# Patient Record
Sex: Female | Born: 1995 | Race: Black or African American | Hispanic: No | Marital: Single | Smoking: Former smoker
Health system: Southern US, Community
[De-identification: ages and names within clinical notes are randomized; demographics above are authoritative.]

## PROBLEM LIST (undated history)

## (undated) ENCOUNTER — Inpatient Hospital Stay: Payer: Self-pay

## (undated) ENCOUNTER — Ambulatory Visit: Admission: EM | Payer: Medicaid Other | Source: Home / Self Care

## (undated) DIAGNOSIS — E079 Disorder of thyroid, unspecified: Secondary | ICD-10-CM

## (undated) DIAGNOSIS — T7840XA Allergy, unspecified, initial encounter: Secondary | ICD-10-CM

## (undated) DIAGNOSIS — O1410 Severe pre-eclampsia, unspecified trimester: Secondary | ICD-10-CM

## (undated) DIAGNOSIS — G43909 Migraine, unspecified, not intractable, without status migrainosus: Secondary | ICD-10-CM

## (undated) DIAGNOSIS — R011 Cardiac murmur, unspecified: Secondary | ICD-10-CM

## (undated) DIAGNOSIS — F419 Anxiety disorder, unspecified: Secondary | ICD-10-CM

## (undated) DIAGNOSIS — N289 Disorder of kidney and ureter, unspecified: Secondary | ICD-10-CM

## (undated) DIAGNOSIS — O09893 Supervision of other high risk pregnancies, third trimester: Secondary | ICD-10-CM

## (undated) DIAGNOSIS — F1910 Other psychoactive substance abuse, uncomplicated: Secondary | ICD-10-CM

## (undated) HISTORY — DX: Cardiac murmur, unspecified: R01.1

## (undated) HISTORY — DX: Other psychoactive substance abuse, uncomplicated: F19.10

## (undated) HISTORY — DX: Disorder of thyroid, unspecified: E07.9

## (undated) HISTORY — PX: THYROID SURGERY: SHX805

## (undated) HISTORY — DX: Anxiety disorder, unspecified: F41.9

## (undated) HISTORY — DX: Allergy, unspecified, initial encounter: T78.40XA

---
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
## 2014-01-21 ENCOUNTER — Observation Stay: Payer: Self-pay

## 2014-01-27 ENCOUNTER — Inpatient Hospital Stay: Payer: Self-pay

## 2014-01-27 LAB — BASIC METABOLIC PANEL
ANION GAP: 6 — AB (ref 7–16)
BUN: 10 mg/dL (ref 9–21)
CALCIUM: 8.4 mg/dL — AB (ref 9.0–10.7)
Chloride: 110 mmol/L — ABNORMAL HIGH (ref 97–107)
Co2: 23 mmol/L (ref 16–25)
Creatinine: 1.49 mg/dL — ABNORMAL HIGH (ref 0.60–1.30)
EGFR (African American): 59 — ABNORMAL LOW
GFR CALC NON AF AMER: 51 — AB
GLUCOSE: 80 mg/dL (ref 65–99)
OSMOLALITY: 276 (ref 275–301)
Potassium: 4 mmol/L (ref 3.3–4.7)
Sodium: 139 mmol/L (ref 132–141)

## 2014-01-27 LAB — PIH PROFILE
ANION GAP: 9 (ref 7–16)
BUN: 10 mg/dL (ref 9–21)
CHLORIDE: 105 mmol/L (ref 97–107)
Calcium, Total: 8.8 mg/dL — ABNORMAL LOW (ref 9.0–10.7)
Co2: 24 mmol/L (ref 16–25)
Creatinine: 1.5 mg/dL — ABNORMAL HIGH (ref 0.60–1.30)
EGFR (Non-African Amer.): 50 — ABNORMAL LOW
GFR CALC AF AMER: 58 — AB
GLUCOSE: 83 mg/dL (ref 65–99)
HCT: 37.8 % (ref 35.0–47.0)
HGB: 12 g/dL (ref 12.0–16.0)
MCH: 28 pg (ref 26.0–34.0)
MCHC: 31.8 g/dL — AB (ref 32.0–36.0)
MCV: 88 fL (ref 80–100)
Osmolality: 274 (ref 275–301)
POTASSIUM: 3.9 mmol/L (ref 3.3–4.7)
Platelet: 160 10*3/uL (ref 150–440)
RBC: 4.29 10*6/uL (ref 3.80–5.20)
RDW: 13 % (ref 11.5–14.5)
SGOT(AST): 19 U/L (ref 0–26)
Sodium: 138 mmol/L (ref 132–141)
URIC ACID: 5.3 mg/dL (ref 3.0–5.8)
WBC: 13 10*3/uL — AB (ref 3.6–11.0)

## 2014-01-27 LAB — URIC ACID: Uric Acid: 5.2 mg/dL (ref 3.0–5.8)

## 2014-01-27 LAB — WBC: WBC: 12.6 10*3/uL — ABNORMAL HIGH (ref 3.6–11.0)

## 2014-01-27 LAB — RBC: RBC: 4.33 10*6/uL (ref 3.80–5.20)

## 2014-01-27 LAB — GC/CHLAMYDIA PROBE AMP

## 2014-01-27 LAB — PROTEIN, URINE, RANDOM: Protein, Random Urine: 42 mg/dL — ABNORMAL HIGH (ref 0–12)

## 2014-01-27 LAB — CREATININE, URINE, RANDOM: Creatinine, Urine Random: 40.1 mg/dL (ref 30.0–125.0)

## 2014-01-27 LAB — SGOT (AST)(ARMC): SGOT(AST): 20 U/L (ref 0–26)

## 2014-01-27 LAB — HEMATOCRIT: HCT: 37.6 % (ref 35.0–47.0)

## 2014-01-27 LAB — PLATELET COUNT: Platelet: 143 10*3/uL — ABNORMAL LOW (ref 150–440)

## 2014-01-27 LAB — HEMOGLOBIN: HGB: 12.4 g/dL (ref 12.0–16.0)

## 2014-01-28 DIAGNOSIS — O09293 Supervision of pregnancy with other poor reproductive or obstetric history, third trimester: Secondary | ICD-10-CM

## 2014-01-28 LAB — PIH PROFILE
ANION GAP: 11 (ref 7–16)
BUN: 9 mg/dL (ref 9–21)
CO2: 22 mmol/L (ref 16–25)
Calcium, Total: 8 mg/dL — ABNORMAL LOW (ref 9.0–10.7)
Chloride: 98 mmol/L (ref 97–107)
Creatinine: 1.6 mg/dL — ABNORMAL HIGH (ref 0.60–1.30)
EGFR (African American): 54 — ABNORMAL LOW
GFR CALC NON AF AMER: 47 — AB
Glucose: 98 mg/dL (ref 65–99)
HCT: 39 % (ref 35.0–47.0)
HGB: 12.7 g/dL (ref 12.0–16.0)
MCH: 28.4 pg (ref 26.0–34.0)
MCHC: 32.6 g/dL (ref 32.0–36.0)
MCV: 87 fL (ref 80–100)
OSMOLALITY: 261 (ref 275–301)
Platelet: 173 10*3/uL (ref 150–440)
Potassium: 3.7 mmol/L (ref 3.3–4.7)
RBC: 4.47 10*6/uL (ref 3.80–5.20)
RDW: 13 % (ref 11.5–14.5)
SGOT(AST): 21 U/L (ref 0–26)
Sodium: 131 mmol/L — ABNORMAL LOW (ref 132–141)
URIC ACID: 5.9 mg/dL — AB (ref 3.0–5.8)
WBC: 16.5 10*3/uL — AB (ref 3.6–11.0)

## 2014-01-28 LAB — MAGNESIUM: MAGNESIUM: 10.9 mg/dL — AB

## 2014-01-29 LAB — CBC WITH DIFFERENTIAL/PLATELET
BASOS PCT: 0.3 %
Basophil #: 0 10*3/uL (ref 0.0–0.1)
Eosinophil #: 0 10*3/uL (ref 0.0–0.7)
Eosinophil %: 0.1 %
HCT: 36.8 % (ref 35.0–47.0)
HGB: 11.7 g/dL — AB (ref 12.0–16.0)
Lymphocyte #: 1.6 10*3/uL (ref 1.0–3.6)
Lymphocyte %: 13 %
MCH: 27.9 pg (ref 26.0–34.0)
MCHC: 31.7 g/dL — AB (ref 32.0–36.0)
MCV: 88 fL (ref 80–100)
Monocyte #: 0.8 x10 3/mm (ref 0.2–0.9)
Monocyte %: 6.8 %
Neutrophil #: 9.7 10*3/uL — ABNORMAL HIGH (ref 1.4–6.5)
Neutrophil %: 79.8 %
Platelet: 165 10*3/uL (ref 150–440)
RBC: 4.18 10*6/uL (ref 3.80–5.20)
RDW: 13.1 % (ref 11.5–14.5)
WBC: 12.1 10*3/uL — AB (ref 3.6–11.0)

## 2014-01-29 LAB — BASIC METABOLIC PANEL
ANION GAP: 6 — AB (ref 7–16)
BUN: 15 mg/dL (ref 9–21)
CALCIUM: 8.2 mg/dL — AB (ref 9.0–10.7)
CREATININE: 1.98 mg/dL — AB (ref 0.60–1.30)
Chloride: 103 mmol/L (ref 97–107)
Co2: 28 mmol/L — ABNORMAL HIGH (ref 16–25)
EGFR (African American): 42 — ABNORMAL LOW
GFR CALC NON AF AMER: 36 — AB
Glucose: 82 mg/dL (ref 65–99)
OSMOLALITY: 274 (ref 275–301)
Potassium: 4.3 mmol/L (ref 3.3–4.7)
SODIUM: 137 mmol/L (ref 132–141)

## 2014-01-29 LAB — COMPREHENSIVE METABOLIC PANEL
ALBUMIN: 2.4 g/dL — AB (ref 3.8–5.6)
AST: 26 U/L (ref 0–26)
Alkaline Phosphatase: 195 U/L — ABNORMAL HIGH
Anion Gap: 9 (ref 7–16)
BUN: 11 mg/dL (ref 9–21)
Bilirubin,Total: 0.3 mg/dL (ref 0.2–1.0)
CHLORIDE: 100 mmol/L (ref 97–107)
Calcium, Total: 7.8 mg/dL — ABNORMAL LOW (ref 9.0–10.7)
Co2: 26 mmol/L — ABNORMAL HIGH (ref 16–25)
Creatinine: 2.06 mg/dL — ABNORMAL HIGH (ref 0.60–1.30)
EGFR (African American): 40 — ABNORMAL LOW
EGFR (Non-African Amer.): 34 — ABNORMAL LOW
Glucose: 101 mg/dL — ABNORMAL HIGH (ref 65–99)
Osmolality: 270 (ref 275–301)
POTASSIUM: 4.3 mmol/L (ref 3.3–4.7)
SGPT (ALT): 12 U/L — ABNORMAL LOW
Sodium: 135 mmol/L (ref 132–141)
Total Protein: 5.4 g/dL — ABNORMAL LOW (ref 6.4–8.6)

## 2014-01-30 LAB — BASIC METABOLIC PANEL
ANION GAP: 2 — AB (ref 7–16)
BUN: 15 mg/dL (ref 9–21)
CALCIUM: 8.7 mg/dL — AB (ref 9.0–10.7)
CO2: 25 mmol/L (ref 16–25)
Chloride: 106 mmol/L (ref 97–107)
Creatinine: 1.73 mg/dL — ABNORMAL HIGH (ref 0.60–1.30)
EGFR (African American): 49 — ABNORMAL LOW
EGFR (Non-African Amer.): 42 — ABNORMAL LOW
GLUCOSE: 82 mg/dL (ref 65–99)
Osmolality: 266 (ref 275–301)
Potassium: 4.3 mmol/L (ref 3.3–4.7)
Sodium: 133 mmol/L (ref 132–141)

## 2014-01-31 LAB — BASIC METABOLIC PANEL
Anion Gap: 2 — ABNORMAL LOW (ref 7–16)
BUN: 17 mg/dL (ref 9–21)
CO2: 29 mmol/L — AB (ref 16–25)
CREATININE: 1.65 mg/dL — AB (ref 0.60–1.30)
Calcium, Total: 8.7 mg/dL — ABNORMAL LOW (ref 9.0–10.7)
Chloride: 102 mmol/L (ref 97–107)
GFR CALC AF AMER: 52 — AB
GFR CALC NON AF AMER: 45 — AB
GLUCOSE: 87 mg/dL (ref 65–99)
Osmolality: 267 (ref 275–301)
POTASSIUM: 4.3 mmol/L (ref 3.3–4.7)
Sodium: 133 mmol/L (ref 132–141)

## 2014-05-17 ENCOUNTER — Emergency Department: Payer: Self-pay | Admitting: Emergency Medicine

## 2014-05-17 LAB — URINALYSIS, COMPLETE
BACTERIA: NONE SEEN
BLOOD: NEGATIVE
Bilirubin,UR: NEGATIVE
Bilirubin,UR: NEGATIVE
Blood: NEGATIVE
Glucose,UR: NEGATIVE mg/dL (ref 0–75)
Glucose,UR: NEGATIVE mg/dL (ref 0–75)
KETONE: NEGATIVE
KETONE: NEGATIVE
Leukocyte Esterase: NEGATIVE
NITRITE: NEGATIVE
Nitrite: NEGATIVE
Ph: 7 (ref 4.5–8.0)
Ph: 7 (ref 4.5–8.0)
SPECIFIC GRAVITY: 1.018 (ref 1.003–1.030)
Specific Gravity: 1.018 (ref 1.003–1.030)
Squamous Epithelial: 28
WBC UR: 5 /HPF (ref 0–5)
WBC UR: 3 /HPF (ref 0–5)

## 2014-05-17 LAB — CBC WITH DIFFERENTIAL/PLATELET
BASOS ABS: 0 10*3/uL (ref 0.0–0.1)
Basophil %: 0.2 %
EOS ABS: 0.1 10*3/uL (ref 0.0–0.7)
Eosinophil %: 1.6 %
HCT: 39 % (ref 35.0–47.0)
HGB: 12.5 g/dL (ref 12.0–16.0)
Lymphocyte #: 1.9 10*3/uL (ref 1.0–3.6)
Lymphocyte %: 27.8 %
MCH: 27.4 pg (ref 26.0–34.0)
MCHC: 32 g/dL (ref 32.0–36.0)
MCV: 86 fL (ref 80–100)
Monocyte #: 0.4 x10 3/mm (ref 0.2–0.9)
Monocyte %: 5.5 %
NEUTROS ABS: 4.4 10*3/uL (ref 1.4–6.5)
Neutrophil %: 64.9 %
PLATELETS: 155 10*3/uL (ref 150–440)
RBC: 4.55 10*6/uL (ref 3.80–5.20)
RDW: 12.9 % (ref 11.5–14.5)
WBC: 6.8 10*3/uL (ref 3.6–11.0)

## 2014-05-17 LAB — COMPREHENSIVE METABOLIC PANEL
Albumin: 3.5 g/dL — ABNORMAL LOW (ref 3.8–5.6)
Alkaline Phosphatase: 108 U/L
Anion Gap: 5 — ABNORMAL LOW (ref 7–16)
BILIRUBIN TOTAL: 0.2 mg/dL (ref 0.2–1.0)
BUN: 18 mg/dL (ref 7–18)
CALCIUM: 8.7 mg/dL — AB (ref 9.0–10.7)
CO2: 28 mmol/L (ref 21–32)
CREATININE: 1.42 mg/dL — AB (ref 0.60–1.30)
Chloride: 107 mmol/L (ref 98–107)
GFR CALC NON AF AMER: 51 — AB
Glucose: 104 mg/dL — ABNORMAL HIGH (ref 65–99)
Osmolality: 282 (ref 275–301)
Potassium: 3.9 mmol/L (ref 3.5–5.1)
SGOT(AST): 22 U/L (ref 0–26)
SGPT (ALT): 16 U/L
SODIUM: 140 mmol/L (ref 136–145)
Total Protein: 6.4 g/dL (ref 6.4–8.6)

## 2014-05-17 LAB — PREGNANCY, URINE: PREGNANCY TEST, URINE: NEGATIVE m[IU]/mL

## 2014-06-30 NOTE — L&D Delivery Note (Addendum)
Delivery Note At 5:45 PM a viable  female was delivered via Vaginal, Spontaneous Delivery (Presentation: Left Occiput ).  APGAR: 9,9 ; weight 6#13oz .   Placenta status: , Spontaneous.  Cord: 3 vessels with the following complications: none.  Cord pH: N/A  Anesthesia: Epidural  Episiotomy: None Lacerations: Labial Suture Repair: 3.0 vicryl Est. Blood Loss (mL): 350  Mom to postpartum.  Baby to Couplet care / Skin to Skin.  15 minute second stage. Infant to maternal abdomen for delayed cord clamping, cut by FOB after pulsation stopped. Infant to skin-to-skin with mother.   Baby's Name: Joyce [name] 02/12/2015, 6:10 PM

## 2014-07-30 ENCOUNTER — Emergency Department: Payer: Self-pay | Admitting: Emergency Medicine

## 2014-07-30 LAB — CBC
HCT: 39.6 % (ref 35.0–47.0)
HGB: 12.6 g/dL (ref 12.0–16.0)
MCH: 26.5 pg (ref 26.0–34.0)
MCHC: 31.8 g/dL — AB (ref 32.0–36.0)
MCV: 83 fL (ref 80–100)
Platelet: 158 10*3/uL (ref 150–440)
RBC: 4.76 10*6/uL (ref 3.80–5.20)
RDW: 13.6 % (ref 11.5–14.5)
WBC: 6.4 10*3/uL (ref 3.6–11.0)

## 2014-07-30 LAB — BASIC METABOLIC PANEL
ANION GAP: 9 (ref 7–16)
BUN: 11 mg/dL (ref 7–18)
CALCIUM: 8.9 mg/dL — AB (ref 9.0–10.7)
CHLORIDE: 107 mmol/L (ref 98–107)
Co2: 21 mmol/L (ref 21–32)
Creatinine: 1.18 mg/dL (ref 0.60–1.30)
EGFR (African American): >60
EGFR (Non-African Amer.): >60
Glucose: 86 mg/dL (ref 65–99)
Osmolality: 273 (ref 275–301)
POTASSIUM: 4.2 mmol/L (ref 3.5–5.1)
Sodium: 137 mmol/L (ref 136–145)

## 2014-07-30 LAB — GC/CHLAMYDIA PROBE AMP

## 2014-07-30 LAB — HCG, QUANTITATIVE, PREGNANCY: Beta Hcg, Quant.: 128600 m[IU]/mL — ABNORMAL HIGH

## 2014-07-30 LAB — WET PREP, GENITAL

## 2014-08-28 LAB — OB RESULTS CONSOLE GC/CHLAMYDIA
Chlamydia: NEGATIVE
Gonorrhea: NEGATIVE

## 2014-08-28 LAB — OB RESULTS CONSOLE RPR: RPR: NONREACTIVE

## 2014-08-28 LAB — OB RESULTS CONSOLE RUBELLA ANTIBODY, IGM: RUBELLA: IMMUNE

## 2014-08-28 LAB — OB RESULTS CONSOLE ABO/RH: RH TYPE: NEGATIVE

## 2014-08-28 LAB — OB RESULTS CONSOLE HGB/HCT, BLOOD
HEMATOCRIT: 37 %
Hemoglobin: 11.9 g/dL

## 2014-08-28 LAB — OB RESULTS CONSOLE ANTIBODY SCREEN: Antibody Screen: POSITIVE

## 2014-08-28 LAB — OB RESULTS CONSOLE HIV ANTIBODY (ROUTINE TESTING): HIV: NONREACTIVE

## 2014-08-28 LAB — OB RESULTS CONSOLE HEPATITIS B SURFACE ANTIGEN: Hepatitis B Surface Ag: NEGATIVE

## 2014-08-28 LAB — OB RESULTS CONSOLE VARICELLA ZOSTER ANTIBODY, IGG: VARICELLA IGG: IMMUNE

## 2014-08-28 LAB — OB RESULTS CONSOLE TSH: TSH: 2.03

## 2014-09-20 ENCOUNTER — Ambulatory Visit
Admit: 2014-09-20 | Disposition: A | Discharge status: Home / Self Care | Payer: Self-pay | Attending: Obstetrics and Gynecology | Admitting: Obstetrics and Gynecology

## 2014-09-29 ENCOUNTER — Ambulatory Visit
Admit: 2014-09-29 | Disposition: A | Discharge status: Home / Self Care | Payer: Self-pay | Attending: Obstetrics and Gynecology | Admitting: Obstetrics and Gynecology

## 2014-11-07 NOTE — H&P (Signed)
L&D Evaluation:  History Expanded:  HPI 19 year old g1 P0with EDC=02/27/2014 by [id number] ultrasound presented to L&D at 7419w5d with c/o cramping and contractions.  This has been ongoing for several days.  She was seen a few weeks ago on L&D by Sonda Primesoleen Gutierrez and discharged with reassurance and a diagnosis of a yeast infection. She has had no leakage nor vaginal bleeding.  She ahs a hx of OCD and PTSD, [facility] first [facility] and remarkable for a couple of prior episodes of bleeding. Is O neg blood type and received Rhogam 1/8 for bleeding. PNC also remarkable for GBS UTI. She also has another yeast infection. SHe presents with cramoing and leftr work to get here and came in by EMS due to the pain.   Gravida 1   Term 0   PreTerm 0   Abortion 0   Living 0   Blood Type (Maternal) O negative   Group B Strep Results Maternal (Result >5wks must be treated as unknown) positive   Maternal HIV Negative   Maternal Syphilis Ab Nonreactive   Maternal Varicella Immune   Rubella Results (Maternal) immune   Maternal T-Dap Unknown   [facility]   Presents with abdominal pain, contractions   Patient's Medical History Depression, OCD, PTSD   Medications Pre Natal Vitamins   Allergies NKDA   Social History none   Family History Non-Contributory   ROS:  ROS see HPI   Exam:  Vital Signs stable  afebrile, otherwise within normal limits   General no apparent distress   Mental Status clear   Chest moves air well   Heart normal sinus rhythm   Abdomen gravid, non-tender   Edema no edema   Pelvic no external lesions, deferred   Mebranes Intact   FHT normal rate with no decels, CAT 1   FHT Description 140/mod var/no accels/no decels   Ucx irritability   Skin no lesions   Lymph no lymphadenopathy   Impression:  Impression UTI, preterm contractions   Plan:  Comments send UA and give diflucan for the yeast infection   Follow Up Appointment in 1  week   Electronic Signatures: Adria DevonKlett, Carrie (MD)  (Signed 19-Jun-15 22:46)  Authored: L&D Evaluation   Last Updated: 19-Jun-15 22:46 by Adria DevonKlett, Carrie (MD)

## 2014-11-07 NOTE — H&P (Signed)
L&D Evaluation:  History:  HPI 19 year old G1 at 7455w4d by 8wk1d US derived EDC=02/27/2014 presenting with contractions starting this morning.  No LOF, no VB, +FM.  Mildly elevated diastolic blood pressures noted on presentation.  Reports headaches, some blurry vision in the past two days.  No increased edema, no RUQ or epigastric pain.  Reports seizure prior this pregnancy but reportedly noraml blood pressures at that time and normotensive throughout pregnancy based on prenatal records.  Denies nausea/emesis/loose stools.  No history of trauma or prior kidney problems to account for elevation in serum Cr.     Is O neg blood type and received Rhogam 1/8 for bleeding. PNC also remarkable for GBS UTI, and a yeast infection.   Presents with decreased fetal movement   Patient's Medical History Depression, OCD, PTSD   Patient's Surgical History none   Medications Pre Natal Vitamins   Allergies NKDA   Social History none   Family History Non-Contributory   ROS:  ROS see HPI   Exam:  Vital Signs stable  BP >140/90  119-167/70/96   Urine Protein P/C ratio pending   General no apparent distress   Mental Status clear   Chest clear   Heart normal sinus rhythm   Abdomen gravid, non-tender   Estimated Fetal Weight Small for gestational age   Fetal Position vtx   Edema no edema   Pelvic no external lesions, 1/70/-2   Mebranes Intact   FHT normal rate with no decels, CAT 1   FHT Description 135/mod var/+accels/no decels   Ucx irritability   Skin dry, no lesions   Lymph no lymphadenopathy   Impression:  Impression reactive NST, 19 yo G1 at 5355w4d presenting wtih contractions but diagnosed with severe preeclampsia   Plan:  Comments 1) Severe Preeclampsia - based on recent neuroligc symptoms and Cr>1.1 (1.49), mild thrombocytopenia with platelets of 143.  H&H 12.4 and 37.6 previously anemic at 28 weeks with H&H at that time 10.6 & 32.0     - severe preeclampsia start  magnesium sulfate for seizure ppx     - pitocin     - monitor urine output closely given elevated serum Cr  2) Fetus - category I tracing     - EFW of 2lbs 13oz c/w 46.5%ile at 705w0d on 12/12/2013  3) PNL O neg / positve anti-D (received rhogam in ER) / RI / VZI / HBsAg neg / RPR NR / HIV neg / 1-h 83 / GBS bacteruria     - ampicillin for GBS ppx  4) TDAP - received 12/21/13  5) Disposition - pending delivery   Follow Up Appointment already scheduled. 2 days   Electronic Signatures for Addendum Section:  Lorrene ReidStaebler, Monterrio Gerst M (MD) (Signed Addendum 31-Jul-15 12:17)  PC ratio not calculated by lab but calculation come to 1047mg /g creatnine further solidifying diagnosis   Electronic Signatures: Lorrene ReidStaebler, Shakeia Krus M (MD)  (Signed 31-Jul-15 12:05)  Authored: L&D Evaluation   Last Updated: 31-Jul-15 12:17 by Lorrene ReidStaebler, Tysheena Ginzburg M (MD)

## 2014-11-07 NOTE — H&P (Signed)
L&D Evaluation:  History:  HPI 19 year old g1 P0with EDC=02/27/2014 by [id number] ultrasound presented to L&D at 4169w5d with c/o decreased fetal movement. She reports that yesterday and today she has been busy and has not noticed her baby move as much. She has only had half of a 6 inch sub today to eat all day and some water. She also reports some BH ctx at times. She reports that she did FKC earlier today but was not able to verbalize the amount of kicks needed in the 2 hour time span.  This has been ongoing for several days.  She has had no leakage nor vaginal bleeding.  She has a hx of OCD and PTSD, [facility] and remarkable for a couple of prior episodes of bleeding. Is O neg blood type and received Rhogam 1/8 for bleeding. PNC also remarkable for GBS UTI, and a yeast infection.   Presents with decreased fetal movement   Patient's Medical History Depression, OCD, PTSD   Patient's Surgical History none   Medications Pre Natal Vitamins   Allergies NKDA   Social History none   Family History Non-Contributory   ROS:  ROS see HPI   Exam:  Vital Signs stable  BP >140/90   General no apparent distress   Mental Status clear   Chest moves air well   Heart normal sinus rhythm   Abdomen gravid, non-tender   Edema no edema   Pelvic no external lesions, deferred   Mebranes Intact   FHT normal rate with no decels, CAT 1   FHT Description 135/mod var/+accels/no decels   Ucx irritability   Skin dry, no lesions   Lymph no lymphadenopathy   Impression:  Impression UTI, reactive NST, IUP at 3969w5d, Cat 1 NST   Plan:  Plan discharge, FKC discussed, PTL precautions   Follow Up Appointment already scheduled. 2 days   Electronic Signatures: Jannet MantisSubudhi, Mikeyla Music (CNM)  (Signed 25-Jul-15 20:02)  Authored: L&D Evaluation   Last Updated: 25-Jul-15 20:02 by Jannet MantisSubudhi, Dianey Suchy (CNM)

## 2014-11-07 NOTE — H&P (Signed)
L&D Evaluation:  History:  HPI 19 year old g1 P0with EDC=02/27/2014 by [id number] ultrasound presented to L&D at 24  3/7 weeks with c/o cramping , decreased FM and bleeding. She was accompanied by the school nurse. C/O cramping for 2 days and woke up this AM with blood in her underwear (brown-pink in color). Has not eaten or drunk very much today sofar. Also c/o not feeling baby move x 3 days, but has felt the baby move today. No dysuria, vulvar irritation or unusual discharge.[facility] and remarkable for a couple of prior episodes of bleeding. Is O neg blood type and received Rhogam 1/8 for bleeding. PNC also remarkable for GBS UTI.   Presents with abdominal pain, decreased fetal movement, vaginal bleeding   Patient's Medical History Depression, OCD, PTSD    Medications Pre Natal Vitamins    Allergies NKDA   Family History Non-Contributory    ROS:  ROS see HPI   Exam:  Vital Signs stable    Urine Protein pending   General no apparent distress   Mental Status clear    Abdomen gravid, mild SP tenderness with palpation.   Fetal Position breech with posterior placenta   Pelvic cervix closed and thick, mild inflammation of introitus. wet prep positive for monilia. discharge is white-no bleeding seen   Mebranes Intact   FHT normal rate with no decels, age appropriate   Ucx irritability with occasional ctx   Skin dry   Impression:  Impression IUP at 24 .3 weeks. No active bleeding. Monila VV. R/O UTI   Plan:  Plan UA, monitor contractions and for cervical change, Diflucan 150mg m po given. PO fluids. FFN and Aptima obtained.   Electronic Signatures: Trinna BalloonGutierrez, Byrant Valent L (CNM)  (Signed 14-May-15 13:38)  Authored: L&D Evaluation   Last Updated: 14-May-15 13:38 by Trinna BalloonGutierrez, Gracia Saggese L (CNM)

## 2014-11-07 NOTE — H&P (Signed)
L&D Evaluation:  History Expanded:  HPI 19 year old g1 P0with EDC=02/27/2014 by [id number] ultrasound presented to L&D at 24  6/7 weeks with c/o cramping and contractions.  This has been ongoing for several days.  She was seen three days ago on L&D by Sonda Primesoleen Gutierrez and discharged with reassurance and a diagnosis of a yeast infection. She also had a gush of fluid this morning at 10.  This occured once and did not occur again and she has had no leakage nor vaginal bleeding.  [facility] and remarkable for a couple of prior episodes of bleeding. Is O neg blood type and received Rhogam 1/8 for bleeding. PNC also remarkable for GBS UTI.   Patient's Medical History Depression, OCD, PTSD   Medications Pre Natal Vitamins   Allergies NKDA   Family History Non-Contributory   ROS:  ROS see HPI   Exam:  Vital Signs afebrele, otherwise within normal limits   General no apparent distress   Mental Status clear   Chest moves air well   Abdomen gravid, non-tender   Edema no edema   Pelvic no external lesions, closed/length hard to determine, though appears long   Mebranes Intact   FHT normal rate with no decels   FHT Description 140/mod var/no accels/no decels   Ucx irritability   Skin no lesions   Impression:  Impression preterm contractions   Plan:  Comments negative fFN 3 days ago u/a three days ago wnl send gonorrhea/chlamydia cultures, wet prep negative obtain vaginal length ultrasound = patient declines.    Will have her follow up this coming week and, if symptoms persist, obtain a cervical length ultrasound.   [facility] schedule[facility]   Labs:  Lab Results:  Routine Micro:  17-May-15 16:13   Micro Text Report WET PREP   COMMENT                   MANY WHITE BLOOD CELLS SEEN   COMMENT                   NO CLUE CELLS SEEN   COMMENT                   NO TRICHOMONAS SEEN   COMMENT                    NO YEAST SEEN   COMMENT                   NO SPERMATOZOA SEEN   ANTIBIOTIC                       Comment 1. MANY WHITE BLOOD CELLS SEEN  Comment 2. NO CLUE CELLS SEEN  Comment 3. NO TRICHOMONAS SEEN  Comment 4. NO YEAST SEEN  Comment 5. NO SPERMATOZOA SEEN  Result(s) reported on 13 Nov 2013 at 04:35PM.   Electronic Signatures: Conard NovakJackson, Vetta Couzens D (MD)  (Signed 17-May-15 17:05)  Authored: L&D Evaluation, Labs   Last Updated: 17-May-15 17:05 by Conard NovakJackson, Ashely Joshua D (MD)

## 2014-12-26 ENCOUNTER — Observation Stay
Admission: RE | Admit: 2014-12-26 | Discharge: 2014-12-26 | Disposition: A | Discharge status: Home / Self Care | Payer: Medicaid Other | Attending: Obstetrics and Gynecology | Admitting: Obstetrics and Gynecology

## 2014-12-26 ENCOUNTER — Encounter: Payer: Self-pay | Admitting: Certified Nurse Midwife

## 2014-12-26 DIAGNOSIS — Z87448 Personal history of other diseases of urinary system: Secondary | ICD-10-CM

## 2014-12-26 DIAGNOSIS — R636 Underweight: Secondary | ICD-10-CM

## 2014-12-26 DIAGNOSIS — R7989 Other specified abnormal findings of blood chemistry: Secondary | ICD-10-CM | POA: Diagnosis present

## 2014-12-26 DIAGNOSIS — O26893 Other specified pregnancy related conditions, third trimester: Principal | ICD-10-CM | POA: Insufficient documentation

## 2014-12-26 DIAGNOSIS — Z9889 Other specified postprocedural states: Secondary | ICD-10-CM

## 2014-12-26 DIAGNOSIS — O09893 Supervision of other high risk pregnancies, third trimester: Secondary | ICD-10-CM

## 2014-12-26 DIAGNOSIS — R030 Elevated blood-pressure reading, without diagnosis of hypertension: Secondary | ICD-10-CM | POA: Diagnosis present

## 2014-12-26 DIAGNOSIS — O1410 Severe pre-eclampsia, unspecified trimester: Secondary | ICD-10-CM | POA: Diagnosis present

## 2014-12-26 DIAGNOSIS — Z3A30 30 weeks gestation of pregnancy: Secondary | ICD-10-CM | POA: Diagnosis not present

## 2014-12-26 DIAGNOSIS — G43909 Migraine, unspecified, not intractable, without status migrainosus: Secondary | ICD-10-CM | POA: Diagnosis not present

## 2014-12-26 DIAGNOSIS — N289 Disorder of kidney and ureter, unspecified: Secondary | ICD-10-CM

## 2014-12-26 HISTORY — DX: Other specified postprocedural states: Z98.890

## 2014-12-26 HISTORY — DX: Migraine, unspecified, not intractable, without status migrainosus: G43.909

## 2014-12-26 HISTORY — DX: Underweight: R63.6

## 2014-12-26 HISTORY — DX: Supervision of other high risk pregnancies, third trimester: O09.893

## 2014-12-26 HISTORY — DX: Severe pre-eclampsia, unspecified trimester: O14.10

## 2014-12-26 HISTORY — DX: Personal history of other diseases of urinary system: Z87.448

## 2014-12-26 HISTORY — DX: Other specified abnormal findings of blood chemistry: R79.89

## 2014-12-26 HISTORY — DX: Elevated blood-pressure reading, without diagnosis of hypertension: R03.0

## 2014-12-26 HISTORY — DX: Disorder of kidney and ureter, unspecified: N28.9

## 2014-12-26 LAB — CBC
HCT: 36.1 % (ref 35.0–47.0)
Hemoglobin: 11.6 g/dL — ABNORMAL LOW (ref 12.0–16.0)
MCH: 28.2 pg (ref 26.0–34.0)
MCHC: 32.1 g/dL (ref 32.0–36.0)
MCV: 87.9 fL (ref 80.0–100.0)
PLATELETS: 130 10*3/uL — AB (ref 150–440)
RBC: 4.11 MIL/uL (ref 3.80–5.20)
RDW: 13.7 % (ref 11.5–14.5)
WBC: 8 10*3/uL (ref 3.6–11.0)

## 2014-12-26 LAB — COMPREHENSIVE METABOLIC PANEL
ALBUMIN: 2.9 g/dL — AB (ref 3.5–5.0)
ALK PHOS: 72 U/L (ref 38–126)
ALT: 8 U/L — AB (ref 14–54)
AST: 20 U/L (ref 15–41)
Anion gap: 7 (ref 5–15)
BILIRUBIN TOTAL: 0.4 mg/dL (ref 0.3–1.2)
BUN: 13 mg/dL (ref 6–20)
CO2: 24 mmol/L (ref 22–32)
CREATININE: 1.11 mg/dL — AB (ref 0.44–1.00)
Calcium: 8.7 mg/dL — ABNORMAL LOW (ref 8.9–10.3)
Chloride: 107 mmol/L (ref 101–111)
GFR calc Af Amer: >60 mL/min (ref >60)
GFR calc non Af Amer: >60 mL/min (ref >60)
Glucose, Bld: 96 mg/dL (ref 65–99)
POTASSIUM: 3.7 mmol/L (ref 3.5–5.1)
SODIUM: 138 mmol/L (ref 135–145)
Total Protein: 5.7 g/dL — ABNORMAL LOW (ref 6.5–8.1)

## 2014-12-26 LAB — PROTEIN / CREATININE RATIO, URINE
Creatinine, Urine: 92 mg/dL
Protein Creatinine Ratio: 0.29 mg/mg{Cre} — ABNORMAL HIGH (ref 0.00–0.15)
Total Protein, Urine: 27 mg/dL

## 2014-12-26 LAB — TYPE AND SCREEN
ABO/RH(D): O NEG
ANTIBODY SCREEN: POSITIVE

## 2014-12-26 LAB — ABO/RH: ABO/RH(D): O NEG

## 2014-12-26 MED ORDER — ACETAMINOPHEN 325 MG PO TABS: 650.0000 mg | ORAL_TABLET | ORAL | Status: DC | PRN

## 2014-12-26 NOTE — Progress Notes (Signed)
Subjective:  Doing well  Objective:   Vitals: Blood pressure 137/83, pulse 76, temperature 98.6 F (37 C), temperature source Oral, resp. rate 16, height 5\' 9"  (1.753 m), weight 61.236 kg (135 lb), last menstrual period 05/29/2014.  Filed Vitals:   12/26/14 1149 12/26/14 1203 12/26/14 1219 12/26/14 1234  BP: 125/76 132/80 132/80 137/83  Pulse: 72 72 71 76  Temp:      TempSrc:      Resp:      Height:      Weight:        General:  NAD Abdomen: gravid soft, nontender    FHT: 125, moderate, positive accels, no decels Toco: none  Results for orders placed or performed during the hospital encounter of 12/26/14 (from the past 24 hour(s))  Protein / creatinine ratio, urine     Status: Abnormal   Collection Time: 12/26/14 11:48 AM  Result Value Ref Range   Creatinine, Urine 92 mg/dL   Total Protein, Urine 27 mg/dL   Protein Creatinine Ratio 0.29 (H) 0.00 - 0.15 mg/mg[Cre]  CBC     Status: Abnormal   Collection Time: 12/26/14 12:02 PM  Result Value Ref Range   WBC 8.0 3.6 - 11.0 K/uL   RBC 4.11 3.80 - 5.20 MIL/uL   Hemoglobin 11.6 (L) 12.0 - 16.0 g/dL   HCT 91.436.1 78.235.0 - 95.647.0 %   MCV 87.9 80.0 - 100.0 fL   MCH 28.2 26.0 - 34.0 pg   MCHC 32.1 32.0 - 36.0 g/dL   RDW 21.313.7 08.611.5 - 57.814.5 %   Platelets 130 (L) 150 - 440 K/uL    Assessment:   19 y.o. G2P0101 2653w1d sent for evaluation of preeclampsia  Plan:   1) Normotensive, normal labs P/C ratio is 290, Cr is 1.1 stable from patient baseline  2) Fetus - category I tracing, reactive by <32 week criteria

## 2014-12-26 NOTE — OB Triage Note (Signed)
PIH eval. Sent from the office by Dr. Vergie LivingPickens

## 2014-12-26 NOTE — Progress Notes (Signed)
Reviewed discharge instructions with patient, including signs of labor, PIH, and reasons to return.

## 2014-12-26 NOTE — H&P (Signed)
Obstetric History and Physical  Joyce [name] is a 19 y.o. G2P0101 with IUP at [redacted]w[redacted]d presenting from the office with elevated blood pressure of 130/90 today. She denies any s/sx of pre-e such as HA, RUQ pain, or visual disturbances. She reports +FM, denies vb, lof or ctx. She has a past history of a short interval pregnancy with a delivery 01/2014. With that pregnancy she had severe pre-eclampsia. She also has impaired renal function with acute renal failure in her last pregnancy. At the time of her NOB, her creatinine was 1.31 and currently sees a nephrologist. At 28 weeks her creatinine was 1.21. She has been followed by [facility] for her history of renal impairment and low/normal platelet count. She was worked up for autoimmune disorders and an ANA, ACA, LAC, and AB2GP1  by [facility] and all were negative.   Prenatal Course Source of Care: [facility]  with onset of care at 13 weeks Pregnancy complications or risks:  Late entry to care Patient Active Problem List   Diagnosis Date Noted  . Elevated blood-pressure reading without diagnosis of hypertension 12/26/2014  . H/O acute renal failure 12/26/2014  . Short interval between pregnancies affecting pregnancy in third trimester, antepartum 12/26/2014  . S/P thyroid surgery 12/26/2014  . Elevated serum creatinine 12/26/2014  . Renal dysfunction 12/26/2014  . Underweight 12/26/2014  . Severe preeclampsia     Prenatal labs and studies: ABO, Rh: O/Negative/-- (02/29 0000) Antibody: Positive (02/29 0000) Rubella: Immune (02/29 0000) Varicella: immune RPR: Nonreactive (02/29 0000)  HBsAg: Negative (02/29 0000)  HIV: Non-reactive (02/29 0000)  GBS:  1 hr Glucola  76 Genetic screening normal Anatomy US normal Tdap: given 12/26/14 Flu: NA   Past Medical History  Diagnosis Date  . Renal dysfunction   . Short interval between pregnancies complicating pregnancy in third trimester, antepartum   . Migraine   . Severe preeclampsia     Past  Surgical History  Procedure Laterality Date  . Thyroid surgery      OB History  Gravida Para Term Preterm AB SAB TAB Ectopic Multiple Living  # Outcome Date GA Lbr Len/2nd Weight Sex Delivery Anes PTL Lv  2 Current           1 Preterm 01/28/14 [redacted]w[redacted]d  2.24 kg (4 lb 15 oz) F    Y     Complications: Hx of preeclampsia, prior pregnancy, currently pregnant, third trimester      History   Social History  . Marital Status: Single    Spouse Name: N/A  . Number of Children: N/A  . Years of Education: N/A   Social History Main Topics  . Smoking status: Never Smoker   . Smokeless tobacco: Not on file  . Alcohol Use: No  . Drug Use: No  . Sexual Activity: Yes    Birth Control/ Protection: None   Other Topics Concern  . None   Social History Narrative  . None    History reviewed. No pertinent family history.  Prescriptions prior to admission  Medication Sig Dispense Refill Last Dose  . aspirin 81 MG tablet Take 81 mg by mouth daily.     . Prenatal Vit-Fe Fumarate-FA (PRENATAL MULTIVITAMIN) TABS tablet Take 1 tablet by mouth daily at 12 noon.       No Known Allergies  Review of Systems: Negative except for what is mentioned in HPI.  Physical Exam: BP 136/76 mmHg  Pulse 83  Temp(Src) 98.6 F (37 C) (Oral)  Resp 16  Ht 5\' 9"  (1.753 m)  Wt 61.236 kg (135 lb)  BMI 19.93 kg/m2  LMP 05/29/2014 (LMP Unknown) GENERAL: Well-developed, well-nourished female in no acute distress.  LUNGS: Clear to auscultation bilaterally.  HEART: Regular rate and rhythm. ABDOMEN: Soft, nontender, nondistended, gravid. EXTREMITIES: Nontender, no edema, 2+ distal pulses.   Presentation: not assessed FHT:  Baseline rate 130s bpm   Variability moderate  Accelerations present   Decelerations none Contractions: none  Pertinent Labs/Studies:   Labs pending   Assessment : Joyce [name] is a 19 y.o. G2P0101 at 4690w1d being seen for elevated BP in the office  Reassuring  FHT  Plan: Serial BP checks PIH labs Dispo: pending labs and BP during observation.    Joyce [name], CNM [facility]

## 2015-01-18 ENCOUNTER — Observation Stay
Admission: RE | Admit: 2015-01-18 | Discharge: 2015-01-18 | Disposition: A | Discharge status: Home / Self Care | Payer: Medicaid Other | Attending: Obstetrics & Gynecology | Admitting: Obstetrics & Gynecology

## 2015-01-18 DIAGNOSIS — R030 Elevated blood-pressure reading, without diagnosis of hypertension: Secondary | ICD-10-CM | POA: Insufficient documentation

## 2015-01-18 DIAGNOSIS — O26893 Other specified pregnancy related conditions, third trimester: Secondary | ICD-10-CM | POA: Diagnosis not present

## 2015-01-18 DIAGNOSIS — O163 Unspecified maternal hypertension, third trimester: Secondary | ICD-10-CM | POA: Diagnosis present

## 2015-01-18 DIAGNOSIS — Z3A33 33 weeks gestation of pregnancy: Secondary | ICD-10-CM | POA: Diagnosis not present

## 2015-01-18 LAB — COMPREHENSIVE METABOLIC PANEL
ALT: 9 U/L — AB (ref 14–54)
ANION GAP: 6 (ref 5–15)
AST: 23 U/L (ref 15–41)
Albumin: 3.2 g/dL — ABNORMAL LOW (ref 3.5–5.0)
Alkaline Phosphatase: 80 U/L (ref 38–126)
BILIRUBIN TOTAL: 0.3 mg/dL (ref 0.3–1.2)
BUN: 9 mg/dL (ref 6–20)
CALCIUM: 8.6 mg/dL — AB (ref 8.9–10.3)
CO2: 25 mmol/L (ref 22–32)
Chloride: 105 mmol/L (ref 101–111)
Creatinine, Ser: 1.12 mg/dL — ABNORMAL HIGH (ref 0.44–1.00)
GFR calc Af Amer: >60 mL/min (ref >60)
GFR calc non Af Amer: >60 mL/min (ref >60)
Glucose, Bld: 86 mg/dL (ref 65–99)
Potassium: 4.4 mmol/L (ref 3.5–5.1)
SODIUM: 136 mmol/L (ref 135–145)
Total Protein: 6 g/dL — ABNORMAL LOW (ref 6.5–8.1)

## 2015-01-18 LAB — PROTEIN / CREATININE RATIO, URINE
Creatinine, Urine: 63 mg/dL
Protein Creatinine Ratio: 0.32 mg/mg{Cre} — ABNORMAL HIGH (ref 0.00–0.15)
Total Protein, Urine: 20 mg/dL

## 2015-01-18 LAB — CBC WITH DIFFERENTIAL/PLATELET
BASOS ABS: 0 10*3/uL (ref 0–0.1)
BASOS PCT: 0 %
EOS ABS: 0 10*3/uL (ref 0–0.7)
EOS PCT: 1 %
HCT: 35.3 % (ref 35.0–47.0)
HEMOGLOBIN: 11.8 g/dL — AB (ref 12.0–16.0)
Lymphocytes Relative: 15 %
Lymphs Abs: 1.3 10*3/uL (ref 1.0–3.6)
MCH: 28.9 pg (ref 26.0–34.0)
MCHC: 33.3 g/dL (ref 32.0–36.0)
MCV: 86.7 fL (ref 80.0–100.0)
MONO ABS: 0.4 10*3/uL (ref 0.2–0.9)
Monocytes Relative: 5 %
NEUTROS ABS: 6.8 10*3/uL — AB (ref 1.4–6.5)
NEUTROS PCT: 79 %
Platelets: 125 10*3/uL — ABNORMAL LOW (ref 150–440)
RBC: 4.07 MIL/uL (ref 3.80–5.20)
RDW: 12.9 % (ref 11.5–14.5)
WBC: 8.6 10*3/uL (ref 3.6–11.0)

## 2015-01-18 MED ORDER — ACETAMINOPHEN 325 MG PO TABS
650.0000 mg | ORAL_TABLET | ORAL | Status: DC | PRN
  Administered 2015-01-18: 650 mg via ORAL
  Filled 2015-01-18: qty 2

## 2015-01-18 MED ORDER — HYDRALAZINE HCL 20 MG/ML IJ SOLN: 10.0000 mg | Freq: Once | INTRAMUSCULAR | Status: DC | PRN

## 2015-01-18 MED ORDER — LABETALOL HCL 5 MG/ML IV SOLN: 20.0000 mg | INTRAVENOUS | Status: DC | PRN

## 2015-01-18 MED ORDER — LABETALOL HCL 200 MG PO TABS: 200.0000 mg | ORAL_TABLET | Freq: Two times a day (BID) | ORAL | Status: DC

## 2015-01-18 NOTE — OB Triage Note (Signed)
Pt presents from office for evaluation of BP. States she has slight HA  Rates it a 2 on pain scale. Experiencing nausea, no vomiting. Denies any spots or any epigastric pain. She states she doesn't feel good.

## 2015-01-18 NOTE — Discharge Summary (Signed)
Obstetric History and Physical  Joyce [name] is a 19 y.o. G2P0101 with Estimated Date of Delivery: 03/05/15 per LMP and 13 wk [place] who presents at [redacted]w[redacted]d presenting for elevated BP in office and c/o mild headache. Pt reports having frequent ha's that worsened this week. Denies visual changes. Patient states she has been having no contractions, no vaginal bleeding, intact membranes, with active fetal movement.   Prenatal Course Source of Care: [facility] with onset of care at 13 weeks Pregnancy complications or risks: H/o pre-eclampsia with severe features with G1 and renal dysfunction for which she is followed by Nephrology.   Patient Active Problem List   Diagnosis Date Noted  . Elevated blood pressure affecting pregnancy in third trimester, antepartum 01/18/2015  . Elevated blood-pressure reading without diagnosis of hypertension 12/26/2014  . H/O acute renal failure 12/26/2014  . Short interval between pregnancies affecting pregnancy in third trimester, antepartum 12/26/2014  . S/P thyroid surgery 12/26/2014  . Elevated serum creatinine 12/26/2014  . Renal dysfunction 12/26/2014  . Underweight 12/26/2014  . H/o Severe preeclampsia     Prenatal labs and studies: ABO, Rh: O neg  Antibody: Anti-D Rubella: Immune Varicella: Immune RPR: Non-reactive HBsAg: Neg HIV: Neg GC/CT: neg/neg GBS: unknown 1 hr Glucola: passed  Genetic screening: 1st trimester screen:     Prenatal Transfer Tool   Past Medical History  Diagnosis Date  . Renal dysfunction   . Short interval between pregnancies complicating pregnancy in third trimester, antepartum   . Migraine   . Severe preeclampsia   - Cyst on thyroid  Past Surgical History  Procedure Laterality Date  . Thyroid surgery      OB History  Gravida Para Term Preterm AB SAB TAB Ectopic Multiple Living  # Outcome Date GA Lbr  Len/2nd Weight Sex Delivery Anes PTL Lv  2 Current           1 Preterm 01/28/14 [redacted]w[redacted]d  4 lb 15 oz (2.24 kg) F    Y   Complications: Hx of preeclampsia, prior pregnancy, currently pregnant, third trimester      History   Social History  . Marital Status: Single    Spouse Name: N/A  . Number of Children: N/A  . Years of Education: N/A   Social History Main Topics  . Smoking status: Never Smoker   . Smokeless tobacco: Not on file  . Alcohol Use: No  . Drug Use: No  . Sexual Activity: Yes    Birth Control/ Protection: None   Other Topics Concern  . Not on file   Social History Narrative  . No narrative on file    No family history on file.  Prescriptions prior to admission  Medication Sig Dispense Refill Last Dose  . aspirin 81 MG tablet Take 81 mg by mouth daily.     . Prenatal Vit-Fe Fumarate-FA (PRENATAL MULTIVITAMIN) TABS tablet Take 1 tablet by mouth daily at 12 noon.       No Known Allergies  Review of Systems: Negative except for what is mentioned in HPI.  Physical Exam:  BPs: 121/75, 125/75, 129/80, 130/81 Temp(Src) 98.3 F (36.8 C) (Oral)  Resp 18  Ht  (1.727 m)  Wt 137 lb (62.143 kg)  BMI 20.84 kg/m2  LMP 05/29/2014 (LMP Unknown) GENERAL: Well-developed, well-nourished female in no acute distress.  ABDOMEN: Soft, nontender, nondistended, gravid. EXTREMITIES: Nontender, no edema Cervix: deferred d/t no contractions FHT:  Category: 1 Baseline rate 135 bpm Variability moderate Accelerations present Decelerations none Contractions: absent  Pertinent Labs/Studies:   Lab Results Last 24 Hours    Results for orders placed or performed during the hospital encounter of 01/18/15 (from the past 24 hour(s))  CBC with Differential Status: Abnormal   Collection Time: 01/18/15 12:16 PM  Result Value Ref Range   WBC 8.6 3.6 - 11.0  K/uL   RBC 4.07 3.80 - 5.20 MIL/uL   Hemoglobin 11.8 (L) 12.0 - 16.0 g/dL   HCT 16.1 09.6 - 04.5 %   MCV 86.7 80.0 - 100.0 fL   MCH 28.9 26.0 - 34.0 pg   MCHC 33.3 32.0 - 36.0 g/dL   RDW 40.9 81.1 - 91.4 %   Platelets 125 (L) 150 - 440 K/uL   Neutrophils Relative % 79 %   Neutro Abs 6.8 (H) 1.4 - 6.5 K/uL   Lymphocytes Relative 15 %   Lymphs Abs 1.3 1.0 - 3.6 K/uL   Monocytes Relative 5 %   Monocytes Absolute 0.4 0.2 - 0.9 K/uL   Eosinophils Relative 1 %   Eosinophils Absolute 0.0 0 - 0.7 K/uL   Basophils Relative 0 %   Basophils Absolute 0.0 0 - 0.1 K/uL      Assessment : IUP at [redacted]w[redacted]d with gestational hypertension  Plan: BPs have been normal and mild range, will start on low-dose Labetalol (200mg  BID).   P/C ratio baseline was 377, today is 320, so no difference in kidney function or evidence of leaky vessels.  Will continue to monitor BPs as outpatient, made appt for patient on Monday 7.25.16 D/c HOME

## 2015-01-18 NOTE — H&P (Signed)
Obstetric History and Physical  Joyce [name] is a 19 y.o. G2P0101 with Estimated Date of Delivery: 03/05/15 per LMP and 13 wk [place] who presents at [redacted]w[redacted]d  presenting for elevated BP in office and c/o mild headache. Pt reports having frequent ha's that worsened this week. Denies visual changes. Patient states she has been having no contractions, no vaginal bleeding, intact membranes, with active fetal movement.    Prenatal Course Source of Care: [facility]  with onset of care at 13 weeks Pregnancy complications or risks: H/o pre-eclampsia with severe features with G1 and renal dysfunction for which she is followed by Nephrology.   Patient Active Problem List   Diagnosis Date Noted  . Elevated blood pressure affecting pregnancy in third trimester, antepartum 01/18/2015  . Elevated blood-pressure reading without diagnosis of hypertension 12/26/2014  . H/O acute renal failure 12/26/2014  . Short interval between pregnancies affecting pregnancy in third trimester, antepartum 12/26/2014  . S/P thyroid surgery 12/26/2014  . Elevated serum creatinine 12/26/2014  . Renal dysfunction 12/26/2014  . Underweight 12/26/2014  . H/o Severe preeclampsia     Prenatal labs and studies: ABO, Rh: O neg  Antibody: Anti-D Rubella: Immune Varicella: Immune RPR:  Non-reactive HBsAg:  Neg HIV: Neg GC/CT: neg/neg GBS: unknown 1 hr Glucola: passed   Genetic screening: 1st trimester screen:     Prenatal Transfer Tool   Past Medical History  Diagnosis Date  . Renal dysfunction   . Short interval between pregnancies complicating pregnancy in third trimester, antepartum   . Migraine   . Severe preeclampsia   - Cyst on thyroid  Past Surgical History  Procedure Laterality Date  . Thyroid surgery      OB History  Gravida Para Term Preterm AB SAB TAB Ectopic Multiple Living  2 1  1      1     # Outcome Date GA Lbr Len/2nd Weight Sex Delivery Anes PTL Lv  2 Current           1 Preterm 01/28/14 [redacted]w[redacted]d  4 lb  15 oz (2.24 kg) F    Y     Complications: Hx of preeclampsia, prior pregnancy, currently pregnant, third trimester      History   Social History  . Marital Status: Single    Spouse Name: N/A  . Number of Children: N/A  . Years of Education: N/A   Social History Main Topics  . Smoking status: Never Smoker   . Smokeless tobacco: Not on file  . Alcohol Use: No  . Drug Use: No  . Sexual Activity: Yes    Birth Control/ Protection: None   Other Topics Concern  . Not on file   Social History Narrative  . No narrative on file    No family history on file.  Prescriptions prior to admission  Medication Sig Dispense Refill Last Dose  . aspirin 81 MG tablet Take 81 mg by mouth daily.     . Prenatal Vit-Fe Fumarate-FA (PRENATAL MULTIVITAMIN) TABS tablet Take 1 tablet by mouth daily at 12 noon.       No Known Allergies  Review of Systems: Negative except for what is mentioned in HPI.  Physical Exam:  BPs: 121/75, 125/75, 129/80, 130/81 Temp(Src) 98.3 F (36.8 C) (Oral)  Resp 18  Ht 5\' 8"  (1.727 m)  Wt 137 lb (62.143 kg)  BMI 20.84 kg/m2  LMP 05/29/2014 (LMP Unknown) GENERAL: Well-developed, well-nourished female in no acute distress.  ABDOMEN: Soft, nontender, nondistended, gravid. EXTREMITIES: Nontender,  no edema Cervix: deferred d/t no contractions FHT: Category: 1 Baseline rate 135 bpm   Variability moderate  Accelerations present   Decelerations none Contractions: absent   Pertinent Labs/Studies:   Results for orders placed or performed during the hospital encounter of 01/18/15 (from the past 24 hour(s))  CBC with Differential     Status: Abnormal   Collection Time: 01/18/15 12:16 PM  Result Value Ref Range   WBC 8.6 3.6 - 11.0 K/uL   RBC 4.07 3.80 - 5.20 MIL/uL   Hemoglobin 11.8 (L) 12.0 - 16.0 g/dL   HCT 78.2 95.6 - 21.3 %   MCV 86.7 80.0 - 100.0 fL   MCH 28.9 26.0 - 34.0 pg   MCHC 33.3 32.0 - 36.0 g/dL   RDW 08.6 57.8 - 46.9 %   Platelets 125 (L) 150 -  440 K/uL   Neutrophils Relative % 79 %   Neutro Abs 6.8 (H) 1.4 - 6.5 K/uL   Lymphocytes Relative 15 %   Lymphs Abs 1.3 1.0 - 3.6 K/uL   Monocytes Relative 5 %   Monocytes Absolute 0.4 0.2 - 0.9 K/uL   Eosinophils Relative 1 %   Eosinophils Absolute 0.0 0 - 0.7 K/uL   Basophils Relative 0 %   Basophils Absolute 0.0 0 - 0.1 K/uL    Assessment : IUP at [redacted]w[redacted]d with elevated BP readings, h/o pre-eclampsia with severe features.   Plan: Awaiting CMP and PC Ratio  BP stable so far, anticipate will be able to discharge home

## 2015-01-22 ENCOUNTER — Observation Stay
Admission: RE | Admit: 2015-01-22 | Discharge: 2015-01-22 | Disposition: A | Discharge status: Home / Self Care | Payer: Medicaid Other | Attending: Obstetrics and Gynecology | Admitting: Obstetrics and Gynecology

## 2015-01-22 DIAGNOSIS — O26893 Other specified pregnancy related conditions, third trimester: Secondary | ICD-10-CM | POA: Diagnosis not present

## 2015-01-22 DIAGNOSIS — Z3A34 34 weeks gestation of pregnancy: Secondary | ICD-10-CM | POA: Insufficient documentation

## 2015-01-22 DIAGNOSIS — R109 Unspecified abdominal pain: Secondary | ICD-10-CM | POA: Diagnosis present

## 2015-01-22 NOTE — OB Triage Note (Signed)
Discharge instructions given with understanding by myself and Dr Vergie Living. [place] instructions verbalized and instructed to keep scheduled appointment on Friday.

## 2015-01-22 NOTE — Discharge Summary (Addendum)
OB Triage Discharge Summary   19 y.o. G2P0101 @ [redacted]w[redacted]d presenting for labor eval.  Fetal status reassuring. EFM showed rNST and negative toco. Labetalol  bid stopped, per guidelines and no h/o severe range BPs.  Labs pending: none RTC 7/29 Disposition: home  Triage evaluation done remotely: No.  [name] MD [facility]  Pager: [phone]

## 2015-01-22 NOTE — Discharge Instructions (Signed)
Check your blood pressure at home three times per day, after resting for 15 minutes Call the office for any blood pressures that are over 150/100  Preeclampsia and Eclampsia Preeclampsia is a serious condition that develops only during pregnancy. It is also called toxemia of pregnancy. This condition causes high blood pressure along with other symptoms, such as swelling and headaches. These may develop as the condition gets worse. Preeclampsia may occur 20 weeks or later into your pregnancy.  Diagnosing and treating preeclampsia early is very important. If not treated early, it can cause serious problems for you and your baby. One problem it can lead to is eclampsia, which is a condition that causes muscle jerking or shaking (convulsions) in the mother. Delivering your baby is the best treatment for preeclampsia or eclampsia.  RISK FACTORS The cause of preeclampsia is not known. You may be more likely to develop preeclampsia if you have certain risk factors. These include:   Being pregnant for the first time.  Having preeclampsia in a past pregnancy.  Having a family history of preeclampsia.  Having high blood pressure.  Being pregnant with twins or triplets.  Being 45 or older.  Being African American.  Having kidney disease or diabetes.  Having medical conditions such as lupus or blood diseases.  Being very overweight (obese). SIGNS AND SYMPTOMS  The earliest signs of preeclampsia are:  High blood pressure.  Increased protein in your urine. Your health care provider will check for this at every prenatal visit. Other symptoms that can develop include:   Severe headaches.  Sudden weight gain.  Swelling of your hands, face, legs, and feet.  Feeling sick to your stomach (nauseous) and throwing up (vomiting).  Vision problems (blurred or double vision).  Numbness in your face, arms, legs, and feet.  Dizziness.  Slurred speech.  Sensitivity to bright  lights.  Abdominal pain. DIAGNOSIS  There are no screening tests for preeclampsia. Your health care provider will ask you about symptoms and check for signs of preeclampsia during your prenatal visits. You may also have tests, including:  Urine testing.  Blood testing.  Checking your baby's heart rate.  Checking the health of your baby and your placenta using images created with sound waves (ultrasound). TREATMENT  You can work out the best treatment approach together with your health care provider. It is very important to keep all prenatal appointments. If you have an increased risk of preeclampsia, you may need more frequent prenatal exams.  Your health care provider may prescribe bed rest.  You may have to eat as little salt as possible.  You may need to take medicine to lower your blood pressure if the condition does not respond to more conservative measures.  You may need to stay in the hospital if your condition is severe. There, treatment will focus on controlling your blood pressure and fluid retention. You may also need to take medicine to prevent seizures.  If the condition gets worse, your baby may need to be delivered early to protect you and the baby. You may have your labor started with medicine (be induced), or you may have a cesarean delivery.  Preeclampsia usually goes away after the baby is born. HOME CARE INSTRUCTIONS   Only take over-the-counter or prescription medicines as directed by your health care provider.  Lie on your left side while resting. This keeps pressure off your baby.  Elevate your feet while resting.  Get regular exercise. Ask your health care provider what type of  exercise is safe for you.  Avoid caffeine and alcohol.  Do not smoke.  Drink 6-8 glasses of water every day.  Eat a balanced diet that is low in salt. Do not add salt to your food.  Avoid stressful situations as much as possible.  Get plenty of rest and sleep.  Keep all  prenatal appointments and tests as scheduled. SEEK MEDICAL CARE IF:  You are gaining more weight than expected.  You have any headaches, abdominal pain, or nausea.  You are bruising more than usual.  You feel dizzy or light-headed.  You have changes in your vision, such as flashes of light or spots in your vision SEEK IMMEDIATE MEDICAL CARE IF:   You develop sudden or severe swelling anywhere in your body. This usually happens in the legs.  You gain 5 lb (2.3 kg) or more in a week.  You have a severe headache, dizziness, problems with your vision, or confusion.  You have severe abdominal pain.  You have lasting nausea or vomiting.  You have a seizure.  You have trouble moving any part of your body.  You develop numbness in your body.  You have trouble speaking.  You have any abnormal bleeding.  You develop a stiff neck.  You pass out. MAKE SURE YOU:   Understand these instructions.  Will watch your condition.  Will get help right away if you are not doing well or get worse. Document Released: 06/13/2000 Document Revised: 06/21/2013 Document Reviewed: 04/08/2013 [facility] Patient Information 2015 [place], [place]. This information is not intended to replace advice given to you by your health care provider. Make sure you discuss any questions you have with your health care provider.     We will discuss your surgery once again in detail at your post-op visit in two to four weeks. If you havent already done so, please call to make your appointment as soon as possible.   (Main) [place]  [address]  [place], [place] [address] [place], [place] [address]  Phone: [phone] Phone: [phone]  Fax: [phone] Fax: [phone]

## 2015-01-22 NOTE — OB Triage Note (Signed)
Received pt to LD from [facility]- for increased BP. Upon arrival Pt c/o being hungry states she has not ate or drunk fluids all day because she slept till 12:00 and then went to doctor.

## 2015-01-22 NOTE — H&P (Addendum)
Obstetrics Admission History & Physical  01/22/2015 - 5:06 PM Primary OBGYN: [facility]  Chief Complaint: abdominal pressure  History of Present Illness  19 y.o. G2P0101 @ [redacted]w[redacted]d (Dating: EDC 9/5, LMP=13), with the above CC. Pregnancy complicated by h/o PTB due to IOL for pre-eclampsia with severe features, CI pregnancy, CKD (baseline Cr 1-1.2), h/o thyroid surgery (normal baseline labs, Rh negative, and gHTN this pregnancy.  Joyce [name] was seen in the office and was 1/60/-2 and had a reactive NST at 1350 today. She denies any decreased FM or s/s of pre-eclampsia and denies any s/s of labor but was having some cramping earlier today.  Review of Systems:  her 12 point review of systems is negative or as noted in the History of Present Illness.  PMHx:  Past Medical History  Diagnosis Date  . Renal dysfunction   . Short interval between pregnancies complicating pregnancy in third trimester, antepartum   . Migraine   . Severe preeclampsia    PSHx:  Past Surgical History  Procedure Laterality Date  . Thyroid surgery     Medications:  Prescriptions prior to admission  Medication Sig Dispense Refill Last Dose  . aspirin 81 MG tablet Take 81 mg by mouth daily.     Marland Kitchen labetalol (NORMODYNE) 200 MG tablet Take 1 tablet (200 mg total) by mouth every 12 (twelve) hours. 60 tablet 4   . Prenatal Vit-Fe Fumarate-FA (PRENATAL MULTIVITAMIN) TABS tablet Take 1 tablet by mouth daily at 12 noon.        Allergies: has No Known Allergies. OBHx:  OB History  Gravida Para Term Preterm AB SAB TAB Ectopic Multiple Living  2 1  1      1     # Outcome Date GA Lbr Len/2nd Weight Sex Delivery Anes PTL Lv  2 Current           1 Preterm 01/28/14 [redacted]w[redacted]d  4 lb 15 oz (2.24 kg) F    Y     Complications: Hx of preeclampsia, prior pregnancy, currently pregnant, third trimester      FHx: History reviewed. No pertinent family history. Soc Hx:  History   Social History  . Marital Status: Single   Spouse Name: N/A  . Number of Children: N/A  . Years of Education: N/A   Occupational History  . Not on file.   Social History Main Topics  . Smoking status: Never Smoker   . Smokeless tobacco: Not on file  . Alcohol Use: No  . Drug Use: No  . Sexual Activity: Yes    Birth Control/ Protection: None   Other Topics Concern  . Not on file   Social History Narrative    Objective    Current Vital Signs 24h Vital Sign Ranges  T  afebrile No Data Recorded  BP (!) 144/81 mmHg BP  Min: 132/80  Max: 144/81  HR 78 Pulse  Avg: 90.5  Min: 78  Max: 113  RR  16 No Data Recorded  SaO2    98%/RA No Data Recorded       24 Hour I/O Current Shift I/O  Time Ins Outs       EFM: 135 baseline, +accels, no decels, moderate variability  Toco: occasional UCs, +irritability  General: Well nourished, well developed female in no acute distress.  Skin:  Warm and dry.  Cardiovascular: Regular rate and rhythm. Respiratory:  Clear to auscultation bilateral. Normal respiratory effort Abdomen: gravid, NTTP Neuro: 1+ brachial b/l Neuro/Psych:  Normal  mood and affect.   SVE: unchanged, per RN  Labs  none  Radiology none  Assessment & Plan  Patient stable with negative EOL *IUP: rNST and fetal status reassuring *gHTN: pt was put on labetalol  bid on 7/21 for mild range BPs. D/w pt that current recommendations are not to tx mild range BPs. Pt told to d/c this and told to check BPs at home and let us know for any in the 150s/100s and for any s/s of severe features, decreased FM. She was strongly encouraged to buy a wrist cuff, as she'll need to keep a close eye on her BPs for the foreseeable future.  RTC 7/24 for AP testing  [name] MD [facility] Pager [phone]

## 2015-02-02 LAB — OB RESULTS CONSOLE GBS: STREP GROUP B AG: POSITIVE

## 2015-02-04 ENCOUNTER — Encounter: Payer: Self-pay | Admitting: Advanced Practice Midwife

## 2015-02-04 ENCOUNTER — Inpatient Hospital Stay
Admission: AD | Admit: 2015-02-04 | Discharge: 2015-02-06 | DRG: 778 | Disposition: A | Discharge status: Home / Self Care | Payer: Medicaid Other | Attending: Obstetrics & Gynecology | Admitting: Obstetrics & Gynecology

## 2015-02-04 DIAGNOSIS — R636 Underweight: Secondary | ICD-10-CM

## 2015-02-04 DIAGNOSIS — D696 Thrombocytopenia, unspecified: Secondary | ICD-10-CM | POA: Diagnosis present

## 2015-02-04 DIAGNOSIS — O133 Gestational [pregnancy-induced] hypertension without significant proteinuria, third trimester: Secondary | ICD-10-CM | POA: Diagnosis present

## 2015-02-04 DIAGNOSIS — O26833 Pregnancy related renal disease, third trimester: Secondary | ICD-10-CM | POA: Diagnosis present

## 2015-02-04 DIAGNOSIS — O09893 Supervision of other high risk pregnancies, third trimester: Secondary | ICD-10-CM

## 2015-02-04 DIAGNOSIS — O99113 Other diseases of the blood and blood-forming organs and certain disorders involving the immune mechanism complicating pregnancy, third trimester: Secondary | ICD-10-CM | POA: Diagnosis present

## 2015-02-04 DIAGNOSIS — Z3A35 35 weeks gestation of pregnancy: Secondary | ICD-10-CM | POA: Diagnosis present

## 2015-02-04 DIAGNOSIS — N289 Disorder of kidney and ureter, unspecified: Secondary | ICD-10-CM

## 2015-02-04 DIAGNOSIS — R7989 Other specified abnormal findings of blood chemistry: Secondary | ICD-10-CM

## 2015-02-04 LAB — CBC
HCT: 34.3 % — ABNORMAL LOW (ref 35.0–47.0)
Hemoglobin: 11.7 g/dL — ABNORMAL LOW (ref 12.0–16.0)
MCH: 29.2 pg (ref 26.0–34.0)
MCHC: 34.1 g/dL (ref 32.0–36.0)
MCV: 85.7 fL (ref 80.0–100.0)
Platelets: 122 10*3/uL — ABNORMAL LOW (ref 150–440)
RBC: 4 MIL/uL (ref 3.80–5.20)
RDW: 12.8 % (ref 11.5–14.5)
WBC: 9.2 10*3/uL (ref 3.6–11.0)

## 2015-02-04 LAB — COMPREHENSIVE METABOLIC PANEL
ALBUMIN: 3 g/dL — AB (ref 3.5–5.0)
ALT: 9 U/L — AB (ref 14–54)
AST: 22 U/L (ref 15–41)
Alkaline Phosphatase: 95 U/L (ref 38–126)
Anion gap: 7 (ref 5–15)
BILIRUBIN TOTAL: 0.5 mg/dL (ref 0.3–1.2)
BUN: <5 mg/dL — ABNORMAL LOW (ref 6–20)
CALCIUM: 8.6 mg/dL — AB (ref 8.9–10.3)
CHLORIDE: 107 mmol/L (ref 101–111)
CO2: 23 mmol/L (ref 22–32)
CREATININE: 1.12 mg/dL — AB (ref 0.44–1.00)
GLUCOSE: 92 mg/dL (ref 65–99)
Potassium: 3.7 mmol/L (ref 3.5–5.1)
SODIUM: 137 mmol/L (ref 135–145)
Total Protein: 5.7 g/dL — ABNORMAL LOW (ref 6.5–8.1)

## 2015-02-04 LAB — CHLAMYDIA/NGC RT PCR (ARMC ONLY)
CHLAMYDIA TR: NOT DETECTED
N GONORRHOEAE: NOT DETECTED

## 2015-02-04 LAB — TYPE AND SCREEN
ABO/RH(D): O NEG
ANTIBODY SCREEN: POSITIVE

## 2015-02-04 MED ORDER — OXYTOCIN BOLUS FROM INFUSION: 500.0000 mL | INTRAVENOUS | Status: DC

## 2015-02-04 MED ORDER — SODIUM CHLORIDE 0.9 % IV SOLN
1.0000 g | INTRAVENOUS | Status: DC
  Administered 2015-02-04 – 2015-02-05 (×5): 1 g via INTRAVENOUS
  Filled 2015-02-04 (×10): qty 1000

## 2015-02-04 MED ORDER — LACTATED RINGERS IV SOLN
INTRAVENOUS | Status: DC
  Administered 2015-02-04: 16:00:00 via INTRAVENOUS
  Administered 2015-02-04: 125 mL/h via INTRAVENOUS

## 2015-02-04 MED ORDER — BETAMETHASONE SOD PHOS & ACET 6 (3-3) MG/ML IJ SUSP
12.0000 mg | Freq: Once | INTRAMUSCULAR | Status: AC
  Administered 2015-02-04: 6 mg via INTRAMUSCULAR
  Filled 2015-02-04: qty 2

## 2015-02-04 MED ORDER — SODIUM CHLORIDE 0.9 % IV SOLN
2.0000 g | Freq: Once | INTRAVENOUS | Status: AC
  Administered 2015-02-04: 2 g via INTRAVENOUS
  Filled 2015-02-04: qty 2000

## 2015-02-04 MED ORDER — ONDANSETRON HCL 4 MG/2ML IJ SOLN
4.0000 mg | Freq: Four times a day (QID) | INTRAMUSCULAR | Status: DC | PRN
  Administered 2015-02-04: 4 mg via INTRAVENOUS

## 2015-02-04 MED ORDER — ZOLPIDEM TARTRATE 5 MG PO TABS
5.0000 mg | ORAL_TABLET | Freq: Every evening | ORAL | Status: DC | PRN
  Administered 2015-02-04: 5 mg via ORAL
  Filled 2015-02-04: qty 1

## 2015-02-04 MED ORDER — BUTORPHANOL TARTRATE 1 MG/ML IJ SOLN
1.0000 mg | INTRAMUSCULAR | Status: DC | PRN
  Administered 2015-02-04 – 2015-02-05 (×2): 1 mg via INTRAVENOUS
  Filled 2015-02-04 (×2): qty 1

## 2015-02-04 MED ORDER — BUTORPHANOL TARTRATE 1 MG/ML IJ SOLN: 2.0000 mg | INTRAMUSCULAR | Status: DC | PRN

## 2015-02-04 MED ORDER — CALCIUM CARBONATE ANTACID 500 MG PO CHEW: 2.0000 | CHEWABLE_TABLET | ORAL | Status: DC | PRN

## 2015-02-04 MED ORDER — BUTORPHANOL TARTRATE 1 MG/ML IJ SOLN
2.0000 mg | INTRAMUSCULAR | Status: DC | PRN
  Administered 2015-02-04 (×2): 2 mg via INTRAVENOUS
  Filled 2015-02-04 (×2): qty 2

## 2015-02-04 MED ORDER — ONDANSETRON HCL 4 MG/2ML IJ SOLN
INTRAMUSCULAR | Status: AC
  Administered 2015-02-04: 4 mg via INTRAVENOUS
  Filled 2015-02-04: qty 2

## 2015-02-04 MED ORDER — OXYTOCIN 40 UNITS IN LACTATED RINGERS INFUSION - SIMPLE MED
62.5000 mL/h | INTRAVENOUS | Status: DC
  Filled 2015-02-04: qty 1000

## 2015-02-04 MED ORDER — ONDANSETRON HCL 4 MG/2ML IJ SOLN: 4.0000 mg | Freq: Four times a day (QID) | INTRAMUSCULAR | Status: DC

## 2015-02-04 MED ORDER — ACETAMINOPHEN 325 MG PO TABS: 650.0000 mg | ORAL_TABLET | ORAL | Status: DC | PRN

## 2015-02-04 MED ORDER — LACTATED RINGERS IV SOLN: 500.0000 mL | INTRAVENOUS | Status: DC | PRN

## 2015-02-04 NOTE — Progress Notes (Signed)
L&D Note  02/04/2015 - 9:06 PM  19 y.o. [id number] [redacted]w[redacted]d   [name] is admitted for PTL   Subjective:  Requesting pain medication.   Objective:   Filed Vitals:   02/04/15 1414 02/04/15 1601 02/04/15 1856 02/04/15 1928  BP: 126/81 124/79 126/81 131/80  Pulse: 69 83 70 69  Temp:  98.2 F (36.8 C) 98.3 F (36.8 C)   TempSrc:  Oral Oral   Resp:  18 18   Height:   (1.727 m)    Weight:  139 lb (63.05 kg)      Current Vital Signs 24h Vital Sign Ranges  T 98.3 F (36.8 C) Temp  Avg: 98.2 F (36.8 C)  Min: 98.1 F (36.7 C)  Max: 98.3 F (36.8 C)  BP 131/80 mmHg BP  Min: 124/79  Max: 132/75  HR 69 Pulse  Avg: 74.4  Min: 69  Max: 83  RR 18 Resp  Avg: 18  Min: 18  Max: 18  SaO2     No Data Recorded       24 Hour I/O Current Shift I/O  Time Ins Outs        FHR: category 1 tracing Toco: q 1.5-2 min SVE: 6-7/90/0 (prior check 6-7 cm around 1800 per RN)    Assessment :  IUP at [redacted]w[redacted]d    Plan:  Stadol 2 mg now Epidural if desired Anticipate vaginal delivery likely soon  Marta Antu, [place]

## 2015-02-04 NOTE — H&P (Signed)
Obstetric History and Physical  Joyce [name] is a 19 y.o. G2P0101 with Estimated Date of Delivery: 03/05/15 per LMP & 13 wk [place] who presents at [redacted]w[redacted]d  presenting for vaginal bleeding. Pt reports SVE on 8/4 at which time she was 3 cm, last night she noted some contractions and woke this am with a half dollar spot of bleeding on her underwear with more spotting noted with wiping. No further bleeding since then. Patient states she has been having some irregular contractions this am, intact membranes, with active fetal movement.    Prenatal Course Source of Care: [facility]  with onset of care at 13 weeks Pregnancy complications or risks:  Acute renal failure with proteinuria - followed by nephrology GHTN with h/o severe pre-eclampsia, elevated diastolic BP between 30-34 weeks, current plan is for IOL at 37 wks   Patient Active Problem List   Diagnosis Date Noted  . Labor and delivery, indication for care 02/04/2015  . Elevated blood pressure affecting pregnancy in third trimester, antepartum 01/18/2015  . H/O acute renal failure 12/26/2014  . Short interval between pregnancies affecting pregnancy in third trimester, antepartum 12/26/2014  . S/P thyroid surgery (initial thyroid labs normal)  12/26/2014  . Elevated serum creatinine 12/26/2014  . Renal dysfunction 12/26/2014  . Underweight 12/26/2014  . Severe preeclampsia    She plans to bottle feed She desires Nexplanon for postpartum contraception.   Prenatal labs and studies: ABO, Rh: O neg (Rhogam given 6/24)  Antibody: Anti-D s/p rhogam in pregnancy Rubella: Immune Varicella: Immune RPR:  Non-reactive HBsAg:  negative HIV: negative GC/CT: neg/neg GBS: pending (drawn 02/01/15) 1 hr Glucola: 76   Genetic screening: Normal NT scan, no labs on file    Prenatal Transfer Tool   Past Medical History  Diagnosis Date  . Renal dysfunction   . Short interval between pregnancies complicating pregnancy in third trimester, antepartum   .  Migraine   . Severe preeclampsia     Past Surgical History  Procedure Laterality Date  . Thyroid surgery      OB History  Gravida Para Term Preterm AB SAB TAB Ectopic Multiple Living  2 1  1      1     # Outcome Date GA Lbr Len/2nd Weight Sex Delivery Anes PTL Lv  2 Current           1 Preterm 01/28/14 [redacted]w[redacted]d  4 lb 15 oz (2.24 kg) F    Y     Complications: Hx of preeclampsia, prior pregnancy, currently pregnant, third trimester      History   Social History  . Marital Status: Single    Spouse Name: N/A  . Number of Children: N/A  . Years of Education: N/A   Social History Main Topics  . Smoking status: Never Smoker   . Smokeless tobacco: Not on file  . Alcohol Use: No  . Drug Use: No  . Sexual Activity: Yes    Birth Control/ Protection: None   Other Topics Concern  . None   Social History Narrative    History reviewed. No pertinent family history.  Prescriptions prior to admission  Medication Sig Dispense Refill Last Dose  . aspirin 81 MG tablet Take 81 mg by mouth daily.     . Prenatal Vit-Fe Fumarate-FA (PRENATAL MULTIVITAMIN) TABS tablet Take 1 tablet by mouth daily at 12 noon.       No Known Allergies  Review of Systems: Negative except for what is mentioned in HPI.  Physical Exam: BP 132/75 mmHg  Pulse 81  LMP 05/29/2014 (LMP Unknown) GENERAL: Well-developed, well-nourished female in no acute distress.  ABDOMEN: Soft, nontender, nondistended, gravid. EXTREMITIES: Nontender, no edema Cervical Exam: Dilatation 4-5 cm   Effacement 80 %   Station -2, posterior (change from 3/80/-2 on 8/4 by same CNM)  Dark blood noted on glove Presentation: cephalic FHT: Category: 1 Baseline rate 130 bpm   Variability moderate  Accelerations present   Decelerations none Contractions: irregular   Pertinent Labs/Studies:   No results found for this or any previous visit (from the past 24 hour(s)).  Assessment : IUP at [redacted]w[redacted]d, early vs prodromal labor  Plan: Observe  for cervical change - plan to recheck in about 2 hours. Reviewed normal show during cervical dilation. Plan for Betamethasone if delivery impending.   GBS pending - will start prophylaxis if cervical change noted  BP stable for last several office visits and today. Will order pre-e labs if indicated.

## 2015-02-04 NOTE — OB Triage Note (Signed)
Pt c/o bleeding which started yesterday but has since stopped.

## 2015-02-04 NOTE — Progress Notes (Signed)
S: Pt reports some contractions, not very regular at this time  O: 6/80/-2, posterior. Category 1 tracing, irregular ctx, VSS  A: IUP at [redacted]w[redacted]d, preterm labor  P: Admit for labor, Betamethasone IM now, Ampicillin IV for GBS unknown, IV stadol prn for pain.   Discussed with pt and s/o unpredictable progress at this point given that contractions have seemed very irregular. Given her preterm status, will admit as though in active labor and plan for delivery. However, given irregular contractions, if no further progress is made I would not be able to augment labor at this point. Will continue to monitor closely for cervical change. Pt and s/o understanding.

## 2015-02-05 LAB — PLATELET COUNT: PLATELETS: 127 10*3/uL — AB (ref 150–440)

## 2015-02-05 LAB — PROTEIN / CREATININE RATIO, URINE
Creatinine, Urine: 37 mg/dL
Protein Creatinine Ratio: 0.46 mg/mg{Cre} — ABNORMAL HIGH (ref 0.00–0.15)
Total Protein, Urine: 17 mg/dL

## 2015-02-05 LAB — RPR: RPR Ser Ql: NONREACTIVE

## 2015-02-05 MED ORDER — ACETAMINOPHEN 500 MG PO TABS: 1000.0000 mg | ORAL_TABLET | Freq: Four times a day (QID) | ORAL | Status: DC | PRN

## 2015-02-05 MED ORDER — BETAMETHASONE SOD PHOS & ACET 6 (3-3) MG/ML IJ SUSP
12.0000 mg | Freq: Once | INTRAMUSCULAR | Status: AC
  Administered 2015-02-05: 12 mg via INTRAMUSCULAR

## 2015-02-05 MED ORDER — SODIUM CHLORIDE 0.9 % IJ SOLN
3.0000 mL | Freq: Two times a day (BID) | INTRAMUSCULAR | Status: DC
  Administered 2015-02-05: 3 mL via INTRAVENOUS

## 2015-02-05 MED ORDER — PRENATAL MULTIVITAMIN CH
1.0000 | ORAL_TABLET | Freq: Every day | ORAL | Status: DC
  Filled 2015-02-05: qty 1

## 2015-02-05 MED ORDER — ZOLPIDEM TARTRATE 5 MG PO TABS
5.0000 mg | ORAL_TABLET | Freq: Every evening | ORAL | Status: DC | PRN
  Administered 2015-02-05: 5 mg via ORAL
  Filled 2015-02-05: qty 1

## 2015-02-05 MED ORDER — BETAMETHASONE SOD PHOS & ACET 6 (3-3) MG/ML IJ SUSP
INTRAMUSCULAR | Status: AC
  Filled 2015-02-05: qty 1

## 2015-02-05 MED ORDER — SODIUM CHLORIDE 0.9 % IJ SOLN
INTRAMUSCULAR | Status: AC
  Filled 2015-02-05: qty 3

## 2015-02-05 NOTE — Progress Notes (Signed)
L&D Progress Note L&D Note  02/05/2015 - 4:28 PM  18 y.o. [id number] [redacted]w[redacted]d by LMP=[redacted]w[redacted]d ultrasound. Pregnancy complicated by renal dysfunction, elevated serum creatinine, mild thrombocytopenia, and a hx of severe preeclampsia requiring preterm induction.   Patient Active Problem List   Diagnosis Date Noted  . Labor and delivery, indication for care 02/04/2015  . Preterm labor 02/04/2015  . Abdominal pain in female 01/22/2015  . Elevated blood pressure affecting pregnancy in third trimester, antepartum 01/18/2015  . Elevated blood-pressure reading without diagnosis of hypertension 12/26/2014  . H/O acute renal failure 12/26/2014  . Short interval between pregnancies affecting pregnancy in third trimester, antepartum 12/26/2014  . S/P thyroid surgery 12/26/2014  . Elevated serum creatinine 12/26/2014  . Renal dysfunction 12/26/2014  . Underweight 12/26/2014  . Severe preeclampsia     [name] is admitted for contractions   Subjective:  Hungry. Wants to go outside  Objective:   Filed Vitals:   02/05/15 0826 02/05/15 1315 02/05/15 1550 02/05/15 1551  BP: 137/83 142/74 131/71   Pulse: 82 70 74   Temp: 98.1 F (36.7 C) 98.2 F (36.8 C)    TempSrc: Oral Oral    Resp: 20 18  20   Height:      Weight:        Current Vital Signs 24h Vital Sign Ranges  T 98.2 F (36.8 C) Temp  Avg: 98.2 F (36.8 C)  Min: 97.9 F (36.6 C)  Max: 98.5 F (36.9 C)  BP 131/71 mmHg BP  Min: 125/68  Max: 142/74  HR 74 Pulse  Avg: 69.4  Min: 57  Max: 82  RR 20 Resp  Avg: 18.7  Min: 18  Max: 20  SaO2     No Data Recorded       24 Hour I/O Current Shift I/O  Time Ins Outs       FHR 120 with accelerations to 150s to 160s, moderate variability Toco: occasional irregular contraction Gen: calm, comfortable, has showered and ambulated. No further pain meds since 0907 SVE:6/70%/-1 to 0/vtx  Labs:   Recent Labs Lab 02/04/15 1554 02/05/15 1020  WBC 9.2  --   HGB 11.7*  --   HCT 34.3*  --    PLT 122* 127*    Recent Labs Lab 02/04/15 1554  NA 137  K 3.7  CL 107  CO2 23  BUN <5*  CREATININE 1.12*  CALCIUM 8.6*  PROT 5.7*  BILITOT 0.5  ALKPHOS 95  ALT 9*  AST 22  GLUCOSE 92   PC ratio: 460mg m Medications Current Facility-Administered Medications  Medication Dose Route Frequency Provider Last Rate Last Dose  . acetaminophen (TYLENOL) tablet 650 mg  650 mg Oral Q4H PRN Marta Antu, CNM      . ampicillin (OMNIPEN) 1 g in sodium chloride 0.9 % 50 mL IVPB  1 g Intravenous 6 times per day Marta Antu, CNM   1 g at 02/05/15 1334  . butorphanol (STADOL) injection 1 mg  1 mg Intravenous Q2H PRN Marta Antu, CNM   1 mg at 02/05/15 0907  . butorphanol (STADOL) injection 2 mg  2 mg Intravenous Q2H PRN Nadara Mustard, MD   2 mg at 02/04/15 2347  . calcium carbonate (TUMS - dosed in mg elemental calcium) chewable tablet 400 mg of elemental calcium  2 tablet Oral Q4H PRN Marta Antu, CNM      . lactated ringers infusion 500-1,000 mL  500-1,000 mL Intravenous PRN Marta Antu, CNM      .  lactated ringers infusion   Intravenous Continuous Marta Antu, CNM 125 mL/hr at 02/04/15 2353 125 mL/hr at 02/04/15 2353  . ondansetron (ZOFRAN) injection 4 mg  4 mg Intravenous Q6H PRN Marta Antu, CNM   4 mg at 02/04/15 1718  . oxytocin (PITOCIN) IV BOLUS FROM BAG  500 mL Intravenous Continuous Marta Antu, CNM   Stopped at 02/04/15 1927  . oxytocin (PITOCIN) IV infusion 40 units in LR 1000 mL  62.5 mL/hr Intravenous Continuous Marta Antu, CNM   Stopped at 02/04/15 1927  . zolpidem (AMBIEN) tablet 5 mg  5 mg Oral QHS PRN Marta Antu, CNM   5 mg at 02/04/15 2308    Assessment & Plan:  No further cervical change. Has received second dose of BMZ. Not currently in labor With advanced cervical dilation, do not feel comfortable sending patient home still pregnant. Will trnsfer patient to mother baby and give regular diet.  Hold GBS antibiotics (has received 4  or 5 doses) Saline lock IV Daily NST Transfer back to L&D for increased signs of labor Scheduled for IOL next Monday (37 weeks) if not in labor  Joyce [name], Joyce [name], CNM  [name] MD [employer] Pager [phone]

## 2015-02-05 NOTE — Progress Notes (Addendum)
Labor and Delivery Note  S: Feeling some cramping this AM. Sleepy. Had Ambien and Stadol last night.   O:  Patient Vitals for the past 24 hrs:  BP Temp Temp src Pulse Resp Height Weight  02/05/15 0826 137/83 mmHg 98.1 F (36.7 C) Oral 82 20 - -  02/05/15 0411 137/81 mmHg 98.3 F (36.8 C) Oral 72 18 - -  02/05/15 0200 - 97.9 F (36.6 C) Oral - - - -  02/04/15 2356 125/68 mmHg 98.5 F (36.9 C) Oral (!) 57 18 - -  02/04/15 2328 133/84 mmHg - - 63 - - -  02/04/15 2229 129/77 mmHg - - (!) 57 - - -  02/04/15 2130 140/88 mmHg - - 76 - - -  02/04/15 2129 140/88 mmHg - - 76 - - -  02/04/15 2030 126/75 mmHg - - 68 - - -  02/04/15 2029 126/75 mmHg - - 68 - - -  02/04/15 1928 131/80 mmHg - - 69 - - -  02/04/15 1856 126/81 mmHg 98.3 F (36.8 C) Oral 70 18 - -  02/04/15 1601 124/79 mmHg 98.2 F (36.8 C) Oral 83 18  (1.727 m) 139 lb (63.05 kg)  02/04/15 1414 126/81 mmHg - - 69 - - -  02/04/15 1222 132/75 mmHg 98.1 F (36.7 C) Oral 81 18 - -   Patient admitted yesterday at 4-5 cm at 63 6/[redacted]weeks gestation with contractions. Had progressed last night to 6-7cm. Receiving GBS PPX and has received one dose of BMZ. This pregnancy complicated by kidney disease with elevated creatinines and proteinuria. Had some mildly elevated blood pressures in office to 130s/90s. Was scheduled for IOL at 37 weeks. Hx of severe preeclampsia with G1 requiring IOL at 35 weeks.  Exam: General: in NAD Cervix: 6-7/75%/-1 to 0 Toco: picking up some contractions-appear to be q2-6 min apart this AM.  FHR: 120 with accelerations to 150s-160s, moderate variability  Creatinine: 1.12 on admission and BUN<5  A/P P at 36 weeks with probable early labor. No change since midnight but contractions now picking up...will probably progress in labor. After consultation with Dr Jean Rosenthal, if any further progress, will commit to delivery. Second BMZ due at 4PM. Clear liquids for now Monitor blood pressures and get P/C ratio Continue  GBS PPX If contractions space out and no further cervical change, will place on [facility] unit and monitor.  Farrel Conners, CNM

## 2015-02-05 NOTE — OB Triage Note (Signed)
Pt transferred by Wheelchair to antepartum unit. Report given to Arlyss Queen RN

## 2015-02-05 NOTE — Progress Notes (Signed)
Per order pt allowed to go outside in a wheelchair to have dinner. Pt instructed to return to unit if feeling signs of progressing labor or impending delivery.

## 2015-02-06 NOTE — Plan of Care (Signed)
Pt discharge instructions, both oral and written, given per CNM and RN.  Pt agrees with plan of care and labor precautions.  Pt states she lives close by and has adequate transportation to return when needed. Pt is aware she should rest as much as possible.  Pt leaving dept in stable condition. Pt taken to visitor entrance per RN with FOB at side.  Ellison Carwin RNC

## 2015-02-06 NOTE — Progress Notes (Signed)
Pt's history and current status discussed with Dr Tiburcio Pea this am. Plan at this point is to monitor pt in-pt d/t advanced cervical dilation (6/90/0 t0 -1)until IOL scheduled for 8/15 am for GHTN at 37 wks. Question if IOL at this time vs outpt management with close f/u at office would be more reasonable. Discussed both options with pt and s/o, she is unsure of her preference. Decision made to get NST in birthplace after pt has ambulated and to recheck cervix. Decision on disposition to be made mutually after this assessment. Pt, s/o, Dr Tiburcio Pea and myself in agreement with plan.

## 2015-02-06 NOTE — Discharge Summary (Signed)
S: Pt reports no increase in contractions in the past approximately 2 hour of being monitored.   O: Category 1 fetal tracing, irregular ctx per toco. Cervical exam at 10:45 am: 6/90/-1 to 0, slightly posterior to pt's left. VSS  A: IUP at [redacted]w[redacted]d, advanced dilation with no cervical change in 44 hours  P: Dr Tiburcio Pea had long discussion with pt and s/o about options. Given preterm status and stable cervical exam since 8/7 pm, reasonable to continue monitoring in-pt or out-pt. Pt prefers to be at home d/t childcare concerns with her 20 year old. She has help at home and is able to return to hospital quickly if active labor occurs. Labor precautions reviewed.

## 2015-02-12 ENCOUNTER — Inpatient Hospital Stay
Admission: RE | Admit: 2015-02-12 | Discharge: 2015-02-14 | DRG: 775 | Disposition: A | Discharge status: Home / Self Care | Payer: Medicaid Other | Source: Ambulatory Visit | Attending: Obstetrics & Gynecology | Admitting: Obstetrics & Gynecology

## 2015-02-12 ENCOUNTER — Encounter (HOSPITAL_COMMUNITY): Payer: Self-pay

## 2015-02-12 ENCOUNTER — Encounter: Payer: Self-pay | Admitting: *Deleted

## 2015-02-12 ENCOUNTER — Inpatient Hospital Stay: Payer: Medicaid Other | Admitting: Anesthesiology

## 2015-02-12 DIAGNOSIS — Z9889 Other specified postprocedural states: Secondary | ICD-10-CM | POA: Diagnosis not present

## 2015-02-12 DIAGNOSIS — O139 Gestational [pregnancy-induced] hypertension without significant proteinuria, unspecified trimester: Secondary | ICD-10-CM | POA: Diagnosis present

## 2015-02-12 DIAGNOSIS — N289 Disorder of kidney and ureter, unspecified: Secondary | ICD-10-CM | POA: Diagnosis present

## 2015-02-12 DIAGNOSIS — Z3A37 37 weeks gestation of pregnancy: Secondary | ICD-10-CM | POA: Diagnosis present

## 2015-02-12 DIAGNOSIS — O133 Gestational [pregnancy-induced] hypertension without significant proteinuria, third trimester: Principal | ICD-10-CM | POA: Diagnosis present

## 2015-02-12 DIAGNOSIS — D62 Acute posthemorrhagic anemia: Secondary | ICD-10-CM | POA: Diagnosis present

## 2015-02-12 DIAGNOSIS — O26833 Pregnancy related renal disease, third trimester: Secondary | ICD-10-CM | POA: Diagnosis present

## 2015-02-12 HISTORY — DX: Gestational (pregnancy-induced) hypertension without significant proteinuria, unspecified trimester: O13.9

## 2015-02-12 LAB — COMPREHENSIVE METABOLIC PANEL
ALT: 11 U/L — AB (ref 14–54)
ANION GAP: 8 (ref 5–15)
AST: 22 U/L (ref 15–41)
Albumin: 3.1 g/dL — ABNORMAL LOW (ref 3.5–5.0)
Alkaline Phosphatase: 103 U/L (ref 38–126)
BUN: 11 mg/dL (ref 6–20)
CHLORIDE: 106 mmol/L (ref 101–111)
CO2: 22 mmol/L (ref 22–32)
CREATININE: 1.15 mg/dL — AB (ref 0.44–1.00)
Calcium: 8.7 mg/dL — ABNORMAL LOW (ref 8.9–10.3)
Glucose, Bld: 81 mg/dL (ref 65–99)
POTASSIUM: 3.9 mmol/L (ref 3.5–5.1)
SODIUM: 136 mmol/L (ref 135–145)
Total Bilirubin: 0.4 mg/dL (ref 0.3–1.2)
Total Protein: 6.1 g/dL — ABNORMAL LOW (ref 6.5–8.1)

## 2015-02-12 LAB — PROTEIN / CREATININE RATIO, URINE
Creatinine, Urine: 67 mg/dL
PROTEIN CREATININE RATIO: 0.36 mg/mg{creat} — AB (ref 0.00–0.15)
TOTAL PROTEIN, URINE: 24 mg/dL

## 2015-02-12 LAB — CBC
HEMATOCRIT: 35.1 % (ref 35.0–47.0)
HEMOGLOBIN: 11.5 g/dL — AB (ref 12.0–16.0)
MCH: 28.3 pg (ref 26.0–34.0)
MCHC: 32.8 g/dL (ref 32.0–36.0)
MCV: 86.3 fL (ref 80.0–100.0)
Platelets: 150 10*3/uL (ref 150–440)
RBC: 4.07 MIL/uL (ref 3.80–5.20)
RDW: 13 % (ref 11.5–14.5)
WBC: 11 10*3/uL (ref 3.6–11.0)

## 2015-02-12 LAB — TYPE AND SCREEN
ABO/RH(D): O NEG
Antibody Screen: POSITIVE

## 2015-02-12 LAB — CHLAMYDIA/NGC RT PCR (ARMC ONLY)
CHLAMYDIA TR: NOT DETECTED
N GONORRHOEAE: NOT DETECTED

## 2015-02-12 LAB — URIC ACID: URIC ACID, SERUM: 5.9 mg/dL (ref 2.3–6.6)

## 2015-02-12 LAB — PLATELET COUNT: PLATELETS: 133 10*3/uL — AB (ref 150–440)

## 2015-02-12 MED ORDER — LANOLIN HYDROUS EX OINT: TOPICAL_OINTMENT | CUTANEOUS | Status: DC | PRN

## 2015-02-12 MED ORDER — ACETAMINOPHEN 325 MG PO TABS
650.0000 mg | ORAL_TABLET | ORAL | Status: DC | PRN
Start: 2015-02-12 — End: 2015-02-14
  Administered 2015-02-13: 650 mg via ORAL
  Filled 2015-02-12 (×2): qty 2

## 2015-02-12 MED ORDER — LACTATED RINGERS IV SOLN
INTRAVENOUS | Status: DC
Start: 2015-02-12 — End: 2015-02-13
  Administered 2015-02-12: 1000 mL via INTRAVENOUS

## 2015-02-12 MED ORDER — IBUPROFEN 600 MG PO TABS
600.0000 mg | ORAL_TABLET | Freq: Four times a day (QID) | ORAL | Status: DC
  Administered 2015-02-13 – 2015-02-14 (×5): 600 mg via ORAL
  Filled 2015-02-12 (×7): qty 1

## 2015-02-12 MED ORDER — LACTATED RINGERS IV SOLN: INTRAVENOUS | Status: DC

## 2015-02-12 MED ORDER — FERROUS SULFATE 325 (65 FE) MG PO TABS
325.0000 mg | ORAL_TABLET | Freq: Every day | ORAL | Status: DC
  Administered 2015-02-13 – 2015-02-14 (×2): 325 mg via ORAL
  Filled 2015-02-12 (×2): qty 1

## 2015-02-12 MED ORDER — AMPICILLIN SODIUM 2 G IJ SOLR
2.0000 g | INTRAMUSCULAR | Status: AC
  Administered 2015-02-12: 2 g via INTRAVENOUS
  Filled 2015-02-12: qty 2000

## 2015-02-12 MED ORDER — OXYTOCIN BOLUS FROM INFUSION: 500.0000 mL | INTRAVENOUS | Status: DC

## 2015-02-12 MED ORDER — ONDANSETRON HCL 4 MG/2ML IJ SOLN
4.0000 mg | INTRAMUSCULAR | Status: DC | PRN
Start: 2015-02-12 — End: 2015-02-13
  Administered 2015-02-12 (×2): 4 mg via INTRAVENOUS
  Filled 2015-02-12 (×2): qty 2

## 2015-02-12 MED ORDER — LIDOCAINE-EPINEPHRINE (PF) 1.5 %-1:200000 IJ SOLN
INTRAMUSCULAR | Status: DC | PRN
  Administered 2015-02-12: 4 mL via PERINEURAL

## 2015-02-12 MED ORDER — BENZOCAINE-MENTHOL 20-0.5 % EX AERO: 1.0000 "application " | INHALATION_SPRAY | CUTANEOUS | Status: DC | PRN

## 2015-02-12 MED ORDER — EPHEDRINE 5 MG/ML INJ
10.0000 mg | INTRAVENOUS | Status: DC | PRN
  Filled 2015-02-12: qty 2

## 2015-02-12 MED ORDER — OXYCODONE-ACETAMINOPHEN 5-325 MG PO TABS
1.0000 | ORAL_TABLET | ORAL | Status: DC | PRN
  Administered 2015-02-12 – 2015-02-13 (×4): 2 via ORAL
  Administered 2015-02-14: 1 via ORAL
  Filled 2015-02-12 (×3): qty 2
  Filled 2015-02-12: qty 1
  Filled 2015-02-12: qty 2

## 2015-02-12 MED ORDER — ZOLPIDEM TARTRATE 5 MG PO TABS
5.0000 mg | ORAL_TABLET | Freq: Every evening | ORAL | Status: DC | PRN
Start: 2015-02-12 — End: 2015-02-14

## 2015-02-12 MED ORDER — ACETAMINOPHEN 325 MG PO TABS: 650.0000 mg | ORAL_TABLET | ORAL | Status: DC | PRN

## 2015-02-12 MED ORDER — WITCH HAZEL-GLYCERIN EX PADS: 1.0000 "application " | MEDICATED_PAD | CUTANEOUS | Status: DC | PRN

## 2015-02-12 MED ORDER — OXYTOCIN 40 UNITS IN LACTATED RINGERS INFUSION - SIMPLE MED: 62.5000 mL/h | INTRAVENOUS | Status: DC

## 2015-02-12 MED ORDER — PHENYLEPHRINE 40 MCG/ML (10ML) SYRINGE FOR IV PUSH (FOR BLOOD PRESSURE SUPPORT)
80.0000 ug | PREFILLED_SYRINGE | INTRAVENOUS | Status: DC | PRN
  Filled 2015-02-12: qty 2

## 2015-02-12 MED ORDER — SODIUM CHLORIDE 0.9 % IV SOLN
1.0000 g | INTRAVENOUS | Status: DC
  Administered 2015-02-12 (×2): 1 g via INTRAVENOUS
  Filled 2015-02-12 (×12): qty 1000

## 2015-02-12 MED ORDER — BUPIVACAINE HCL (PF) 0.25 % IJ SOLN
INTRAMUSCULAR | Status: DC | PRN
  Administered 2015-02-12: 10 mL

## 2015-02-12 MED ORDER — TERBUTALINE SULFATE 1 MG/ML IJ SOLN
0.2500 mg | Freq: Once | INTRAMUSCULAR | Status: DC | PRN
  Filled 2015-02-12: qty 1

## 2015-02-12 MED ORDER — OXYTOCIN 40 UNITS IN LACTATED RINGERS INFUSION - SIMPLE MED
1.0000 m[IU]/min | INTRAVENOUS | Status: DC
  Administered 2015-02-12: 1 m[IU]/min via INTRAVENOUS
  Filled 2015-02-12: qty 1000

## 2015-02-12 MED ORDER — BUTORPHANOL TARTRATE 1 MG/ML IJ SOLN
1.0000 mg | INTRAMUSCULAR | Status: DC | PRN
  Administered 2015-02-12: 1 mg via INTRAVENOUS
  Filled 2015-02-12: qty 1

## 2015-02-12 MED ORDER — BUTORPHANOL TARTRATE 1 MG/ML IJ SOLN: 2.0000 mg | INTRAMUSCULAR | Status: DC | PRN

## 2015-02-12 MED ORDER — FENTANYL 2.5 MCG/ML W/ROPIVACAINE 0.2% IN NS 100 ML EPIDURAL INFUSION (ARMC-ANES)
10.0000 mL/h | EPIDURAL | Status: DC
  Administered 2015-02-12 (×2): 10 mL/h via EPIDURAL
  Filled 2015-02-12: qty 100

## 2015-02-12 MED ORDER — LACTATED RINGERS IV SOLN: 500.0000 mL | INTRAVENOUS | Status: DC | PRN

## 2015-02-12 MED ORDER — ONDANSETRON HCL 4 MG PO TABS
4.0000 mg | ORAL_TABLET | ORAL | Status: DC | PRN
  Administered 2015-02-13: 4 mg via ORAL
  Filled 2015-02-12: qty 1

## 2015-02-12 MED ORDER — ONDANSETRON HCL 4 MG/2ML IJ SOLN
4.0000 mg | Freq: Four times a day (QID) | INTRAMUSCULAR | Status: DC | PRN
  Administered 2015-02-12: 4 mg via INTRAVENOUS
  Filled 2015-02-12: qty 2

## 2015-02-12 MED ORDER — SIMETHICONE 80 MG PO CHEW
80.0000 mg | CHEWABLE_TABLET | ORAL | Status: DC | PRN
  Administered 2015-02-13: 80 mg via ORAL
  Filled 2015-02-12: qty 1

## 2015-02-12 MED ORDER — DIPHENHYDRAMINE HCL 50 MG/ML IJ SOLN: 12.5000 mg | INTRAMUSCULAR | Status: DC | PRN

## 2015-02-12 MED ORDER — DIBUCAINE 1 % RE OINT: 1.0000 "application " | TOPICAL_OINTMENT | RECTAL | Status: DC | PRN

## 2015-02-12 MED ORDER — DOCUSATE SODIUM 100 MG PO CAPS
100.0000 mg | ORAL_CAPSULE | Freq: Two times a day (BID) | ORAL | Status: DC
  Administered 2015-02-12 – 2015-02-14 (×4): 100 mg via ORAL
  Filled 2015-02-12 (×4): qty 1

## 2015-02-12 MED ORDER — DIPHENHYDRAMINE HCL 25 MG PO CAPS: 25.0000 mg | ORAL_CAPSULE | Freq: Four times a day (QID) | ORAL | Status: DC | PRN

## 2015-02-12 MED ORDER — PRENATAL MULTIVITAMIN CH
1.0000 | ORAL_TABLET | Freq: Every day | ORAL | Status: DC
  Administered 2015-02-13 – 2015-02-14 (×2): 1 via ORAL
  Filled 2015-02-12 (×2): qty 1

## 2015-02-12 NOTE — H&P (Signed)
Obstetric History and Physical  Joyce [name] is a 19 y.o. G2P0101 with Estimated Date of Delivery: 03/05/15 per LMP & 13 wk [place] who presents at [redacted]w[redacted]d presenting for IOL for GHTN and advanced cervical dilation. Pt was seen on 8/7-8/9 for preterm labor. Cervix dilated to 6/90/0 to -1 without any further change. Since d/c home on 8/9 pt denies regular ctx. No bleeding or LOF. +FM.   Prenatal Course Source of Care: [facility]  with onset of care at 13 weeks Pregnancy complications or risks:  Acute renal failure with proteinuria - followed by nephrology GHTN with h/o severe pre-eclampsia, elevated diastolic BP between 30-34 weeks.  Patient Active Problem List   Diagnosis Date Noted  . Labor and delivery, indication for care 02/04/2015  . Elevated blood pressure affecting pregnancy in third trimester, antepartum 01/18/2015  . H/O acute renal failure 12/26/2014  . Short interval between pregnancies affecting pregnancy in third trimester, antepartum 12/26/2014  . S/P thyroid surgery (initial thyroid labs normal)  12/26/2014  . Elevated serum creatinine 12/26/2014  . Renal dysfunction 12/26/2014  . Underweight 12/26/2014  . Severe preeclampsia    She plans to bottle feed She desires Nexplanon for postpartum contraception.   Prenatal labs and studies: ABO, Rh: O neg (Rhogam given 6/24)  Antibody: Anti-D s/p rhogam in pregnancy Rubella: Immune Varicella: Immune RPR:  Non-reactive HBsAg:  negative HIV: negative GC/CT: neg/neg GBS: pending (drawn 02/01/15) 1 hr Glucola: 76   Genetic screening: Normal NT scan, no labs on file    Prenatal Transfer Tool   Past Medical History  Diagnosis Date  . Renal dysfunction   . Short interval between pregnancies complicating pregnancy in third trimester, antepartum   . Migraine   . Severe preeclampsia     Past Surgical History  Procedure Laterality Date  . Thyroid surgery      OB History  Gravida Para Term Preterm AB SAB TAB Ectopic Multiple  Living  2 1  1      0 1    # Outcome Date GA Lbr Len/2nd Weight Sex Delivery Anes PTL Lv  2 Current           1 Preterm 01/28/14 [redacted]w[redacted]d  4 lb 15 oz (2.24 kg) F    Y     Complications: Hx of preeclampsia, prior pregnancy, currently pregnant, third trimester      Social History   Social History  . Marital Status: Single    Spouse Name: N/A  . Number of Children: N/A  . Years of Education: N/A   Social History Main Topics  . Smoking status: Never Smoker   . Smokeless tobacco: None  . Alcohol Use: No  . Drug Use: No  . Sexual Activity: Yes    Birth Control/ Protection: None   Other Topics Concern  . None   Social History Narrative    History reviewed. No pertinent family history.  Prescriptions prior to admission  Medication Sig Dispense Refill Last Dose  . aspirin 81 MG chewable tablet Chew by mouth daily.   02/11/2015  . Prenatal Vit-Fe Fumarate-FA (PRENATAL MULTIVITAMIN) TABS tablet Take 1 tablet by mouth daily at 12 noon.   02/11/2015    No Known Allergies  Review of Systems: Negative except for what is mentioned in HPI.  Physical Exam: BP 127/77 mmHg  Pulse 87  Temp(Src) 98 F (36.7 C) (Oral)  Resp 18  Ht 5\' 9"  (1.753 m)  Wt 140 lb (63.504 kg)  BMI 20.67 kg/m2  LMP  05/29/2014 (LMP Unknown) GENERAL: Well-developed, well-nourished female in no acute distress.  ABDOMEN: Soft, nontender, nondistended, gravid. EXTREMITIES: Nontender, no edema Cervical Exam: Dilatation 6  Effacement 90 %   Station 0 to -1, posterior to pt's left Presentation: cephalic FHT: Category: 1 Baseline rate 135 bpm   Variability moderate  Accelerations present   Decelerations none Contractions: irregular   Pertinent Labs/Studies:   Results for orders placed or performed during the hospital encounter of 02/12/15 (from the past 24 hour(s))  CBC     Status: Abnormal   Collection Time: 02/12/15  9:01 AM  Result Value Ref Range   WBC 11.0 3.6 - 11.0 K/uL   RBC 4.07 3.80 - 5.20 MIL/uL    Hemoglobin 11.5 (L) 12.0 - 16.0 g/dL   HCT 96.0 45.4 - 09.8 %   MCV 86.3 80.0 - 100.0 fL   MCH 28.3 26.0 - 34.0 pg   MCHC 32.8 32.0 - 36.0 g/dL   RDW 11.9 14.7 - 82.9 %   Platelets 150 150 - 440 K/uL  Comprehensive metabolic panel     Status: Abnormal   Collection Time: 02/12/15  9:01 AM  Result Value Ref Range   Sodium 136 135 - 145 mmol/L   Potassium 3.9 3.5 - 5.1 mmol/L   Chloride 106 101 - 111 mmol/L   CO2 22 22 - 32 mmol/L   Glucose, Bld 81 65 - 99 mg/dL   BUN 11 6 - 20 mg/dL   Creatinine, Ser 5.62 (H) 0.44 - 1.00 mg/dL   Calcium 8.7 (L) 8.9 - 10.3 mg/dL   Total Protein 6.1 (L) 6.5 - 8.1 g/dL   Albumin 3.1 (L) 3.5 - 5.0 g/dL   AST 22 15 - 41 U/L   ALT 11 (L) 14 - 54 U/L   Alkaline Phosphatase 103 38 - 126 U/L   Total Bilirubin 0.4 0.3 - 1.2 mg/dL   GFR calc non Af Amer >60 >60 mL/min   GFR calc Af Amer >60 >60 mL/min   Anion gap 8 5 - 15  Uric acid     Status: None   Collection Time: 02/12/15  9:01 AM  Result Value Ref Range   Uric Acid, Serum 5.9 2.3 - 6.6 mg/dL  Type and screen for Red Blood Exchange     Status: None (Preliminary result)   Collection Time: 02/12/15  9:04 AM  Result Value Ref Range   ABO/RH(D) PENDING    Antibody Screen PENDING    Sample Expiration 02/15/2015     Assessment : IUP at [redacted]w[redacted]d, GHTN, advanced cervical dilation   Plan:  GHTN - BP and labs stable (PC Ratio pending at this time.)   IOL - begin Pitocin once antibiotics have been in x 3 hours (approx noon). Pt plans for epidural. Will defer AROM until after epidural.

## 2015-02-12 NOTE — Anesthesia Procedure Notes (Signed)
Epidural Patient location during procedure: OB Start time: 02/12/2015 4:19 PM End time: 02/12/2015 4:24 PM  Staffing Anesthesiologist: Yves Dill Performed by: anesthesiologist   Preanesthetic Checklist Completed: patient identified, site marked, surgical consent, pre-op evaluation, timeout performed, IV checked, risks and benefits discussed and monitors and equipment checked  Epidural Patient position: sitting Prep: Betadine and site prepped and draped Patient monitoring: heart rate, cardiac monitor, continuous pulse ox and blood pressure Approach: midline Location: L3-L4 Injection technique: LOR air  Needle:  Needle type: Tuohy  Needle gauge: 18 G Needle length: 9 cm Catheter type: closed end flexible Catheter size: 20 Guage Test dose: negative and 1.5% lidocaine with Epi 1:200 K  Assessment Sensory level: T8  Additional Notes Time out called.   Patient placed in sitting position.  Prepped and draped in sterile fashion.  A skin wheal was made with 1% Lidocaine plain at the L3-L4 interspace.  An 18G Tuohy needle was guided to the epidural space by the loss of R' technique.  An epidural catheter was threaded easily with negative TD.  The patient tolerated the procedure well.Reason for block:procedure for pain

## 2015-02-12 NOTE — Progress Notes (Signed)
Pt assisted to bathroom, she voided, shown pericare, clean pad icepack and gown on.   Pt transferred via w/c to PP rm 341 in stable condition.

## 2015-02-12 NOTE — Progress Notes (Signed)
L&D Note  02/12/2015 - 3:28 PM  19 y.o. G2P0101 [redacted]w[redacted]d   Ms. JANELLI WELLING is admitted for IOL for GHTN and advanced cervical dilation   Subjective:  Pt denies feeling regular ctx  Objective:   Filed Vitals:   02/12/15 0823 02/12/15 1203 02/12/15 1409  BP: 127/77 129/77 135/81  Pulse: 87 82 81  Temp: 98 F (36.7 C) 98.4 F (36.9 C)   TempSrc: Oral Oral   Resp: Height:  (1.753 m)    Weight: 140 lb (63.504 kg)      Current Vital Signs 24h Vital Sign Ranges  T 98.4 F (36.9 C) Temp  Avg: 98.2 F (36.8 C)  Min: 98 F (36.7 C)  Max: 98.4 F (36.9 C)  BP 135/81 mmHg BP  Min: 127/77  Max: 135/81  HR 81 Pulse  Avg: 83.3  Min: 81  Max: 87  RR 18 Resp  Avg: 18.7  Min: 18  Max: 20  SaO2     No Data Recorded       24 Hour I/O Current Shift I/O  Time Ins Outs        FHR: category 1 tracing Toco: ctx q 2-5 min SVE: 7/90/0   Assessment :  IUP at [redacted]w[redacted]d, IOL    Plan:  AROM with clear, blood tinged fluid noted Repeat platelets per Anesthesia request Epidural or IV stadol when desired  Marta Antu, CNM

## 2015-02-12 NOTE — Progress Notes (Signed)
   02/12/15 1030  Clinical Encounter Type  Visited With Patient and family together  Visit Type Initial;Spiritual support  Referral From Nurse  Consult/Referral To Chaplain  Spiritual Encounters  Spiritual Needs Prayer  Stress Factors  Patient Stress Factors Other (Comment)  Chaplain Anthonette Legato received an order in the system to visit patient in the unit and offered prayer for the patient, family and a blessing was extended upon the new born son.   Chaplain [place], Ext. [phone] Chaplain Flower Franko A. Paola Flynt, Ext. [phone]

## 2015-02-12 NOTE — Anesthesia Preprocedure Evaluation (Addendum)
Anesthesia Evaluation  Patient identified by MRN, date of birth, ID band Patient awake    Reviewed: Allergy & Precautions, NPO status   Airway Mallampati: II  TM Distance: >3 FB Neck ROM: Full    Dental no notable dental hx.    Pulmonary neg pulmonary ROS,    Pulmonary exam normal       Cardiovascular hypertension, Normal cardiovascular exam    Neuro/Psych  Headaches, negative psych ROS   GI/Hepatic negative GI ROS, Neg liver ROS,   Endo/Other  negative endocrine ROS  Renal/GU negative Renal ROS  negative genitourinary   Musculoskeletal negative musculoskeletal ROS (+)   Abdominal Normal abdominal exam  (+)   Peds  Hematology negative hematology ROS (+)   Anesthesia Other Findings   Reproductive/Obstetrics                            Anesthesia Physical Anesthesia Plan  ASA: II  Anesthesia Plan: Epidural   Post-op Pain Management:    Induction:   Airway Management Planned: Natural Airway  Additional Equipment:   Intra-op Plan:   Post-operative Plan:   Informed Consent: I have reviewed the patients History and Physical, chart, labs and discussed the procedure including the risks, benefits and alternatives for the proposed anesthesia with the patient or authorized representative who has indicated his/her understanding and acceptance.   Dental advisory given  Plan Discussed with: Surgeon  Anesthesia Plan Comments:         Anesthesia Quick Evaluation

## 2015-02-13 LAB — HEMOGLOBIN AND HEMATOCRIT, BLOOD
HEMATOCRIT: 32 % — AB (ref 35.0–47.0)
Hemoglobin: 10.4 g/dL — ABNORMAL LOW (ref 12.0–16.0)

## 2015-02-13 LAB — CREATININE, SERUM
CREATININE: 1.26 mg/dL — AB (ref 0.44–1.00)
GFR calc Af Amer: >60 mL/min (ref >60)

## 2015-02-13 LAB — FETAL SCREEN: FETAL SCREEN: NEGATIVE

## 2015-02-13 LAB — RPR: RPR: NONREACTIVE

## 2015-02-13 MED ORDER — RHO D IMMUNE GLOBULIN 1500 UNIT/2ML IJ SOSY
300.0000 ug | PREFILLED_SYRINGE | Freq: Once | INTRAMUSCULAR | Status: AC
  Administered 2015-02-13: 300 ug via INTRAVENOUS
  Filled 2015-02-13: qty 2

## 2015-02-13 NOTE — Progress Notes (Signed)
Patient ID: Joyce [name], female   DOB: [birth year], 19 y.o.   MRN: [id number] Obstetric Postpartum Daily Progress Note Subjective:  19 y.o. [id number] PPD#1 status post vaginal delivery.  She is ambulating, is tolerating po, is voiding spontaneously.  Her pain is well controlled on PO pain medications. Her lochia is less than menses.   Medications SCHEDULED MEDICATIONS  . docusate sodium  100 mg Oral BID  . ferrous sulfate  325 mg Oral Q breakfast  . ibuprofen  600 mg Oral 4 times per day  . prenatal multivitamin  1 tablet Oral Q1200    MEDICATION INFUSIONS  . lactated ringers      PRN MEDICATIONS  acetaminophen, benzocaine-Menthol, witch hazel-glycerin **AND** dibucaine, diphenhydrAMINE, lanolin, ondansetron (ZOFRAN) IV, ondansetron **OR** ondansetron (ZOFRAN) IV, oxyCODONE-acetaminophen, simethicone, zolpidem    Objective:   Filed Vitals:   02/13/15 0139 02/13/15 0400 02/13/15 0554 02/13/15 0731  BP: 143/91 122/91 102/60 134/88  Pulse: 74 72 56 76  Temp:  98.8 F (37.1 C)  97.7 F (36.5 C)  TempSrc:  Oral  Oral  Resp:  18  18  Height:      Weight:      SpO2:  100%  98%    Current Vital Signs 24h Vital Sign Ranges  T 97.7 F (36.5 C) Temp  Avg: 98.3 F (36.8 C)  Min: 97.7 F (36.5 C)  Max: 98.8 F (37.1 C)  BP 134/88 mmHg BP  Min: 102/60  Max: 148/96  HR 76 Pulse  Avg: 74.2  Min: 56  Max: 91  RR 18 Resp  Avg: 18.4  Min: 18  Max: 20  SaO2 98 % Not Delivered SpO2  Avg: 99.2 %  Min: 98 %  Max: 100 %       24 Hour I/O Current Shift I/O  Time Ins Outs 08/15 0701 - 08/16 0700 In: 999 [I.V.:949] Out: 1675 [Urine:925]    General: NAD Pulmonary: no increased work of breathing Abdomen: non-distended, non-tender, fundus firm at level of umbilicus -1 Extremities: no edema, no erythema, no tenderness    Recent Labs Lab 02/12/15 0901 02/12/15 1526 02/13/15 0545  WBC 11.0  --   --   HGB 11.5*  --  10.4*  HCT 35.1  --  32.0*  PLT 150 133*  --      Assessment:   19 y.o. [id number] postpartum day # 1 s/p SVD, history of unspecified intrinsic kidney disease, doing well overall.  Plan:    1) Acute blood loss anemia - hemodynamically stable and asymptomatic - po ferrous sulfate  2) O NEG / Rubella Immune (02/29 0000)/ Varicella Immune  3) TDAP status Up to date  4) bottle /Contraception = nexplanon   5) unspecified intrinsic kidney disease: Recheck creatinine today.  Plan for follow up in one-to-two weeks to check BP and symptoms. Will place Nexplanon at that appointment, also.  6) Disposition: Home tomorrow  Conard Novak, MD, FACOG 02/13/2015 12:08 PM

## 2015-02-13 NOTE — Anesthesia Postprocedure Evaluation (Signed)
  Anesthesia Post-op Note  Patient: Joyce [name]  Procedure(s) Performed: * No procedures listed *  Anesthesia type:Epidural  Patient location:Floor  Post pain: Pain level controlled  Post assessment: Post-op Vital signs reviewed, Patient's Cardiovascular Status Stable, Respiratory Function Stable, Patent Airway and No signs of Nausea or vomiting  Post vital signs: Reviewed and stable  Last Vitals:  Filed Vitals:   02/13/15 0554  BP: 102/60  Pulse: 56  Temp:   Resp:     Level of consciousness: awake, alert  and patient cooperative  Complications: No apparent anesthesia complications

## 2015-02-14 LAB — RHOGAM INJECTION: Unit division: 0

## 2015-02-14 LAB — CREATININE, SERUM
CREATININE: 1.33 mg/dL — AB (ref 0.44–1.00)
GFR calc Af Amer: >60 mL/min (ref >60)
GFR calc non Af Amer: 57 mL/min — ABNORMAL LOW (ref >60)

## 2015-02-14 LAB — BUN: BUN: 12 mg/dL (ref 6–20)

## 2015-02-14 MED ORDER — OXYCODONE-ACETAMINOPHEN 5-325 MG PO TABS: 1.0000 | ORAL_TABLET | Freq: Four times a day (QID) | ORAL | Status: DC | PRN

## 2015-02-14 MED ORDER — ACETAMINOPHEN 325 MG PO TABS
650.0000 mg | ORAL_TABLET | ORAL | Status: DC | PRN
Start: 2015-02-14 — End: 2018-03-16

## 2015-02-14 MED ORDER — DOCUSATE SODIUM 100 MG PO CAPS: 100.0000 mg | ORAL_CAPSULE | Freq: Two times a day (BID) | ORAL | Status: DC | PRN

## 2015-02-14 MED ORDER — BENZOCAINE-MENTHOL 20-0.5 % EX AERO: 1.0000 "application " | INHALATION_SPRAY | CUTANEOUS | Status: DC | PRN

## 2015-02-14 NOTE — Progress Notes (Signed)
Discharge instructions reviewed with mother. Mother v/u of all instructions. ID bands of mom and infant matched. Pt to call for follow up appointments. Escorted by auxillary via w/c in stable condition, infant in arms. Ruta Hinds, RN 02/14/15 1520

## 2015-02-14 NOTE — Progress Notes (Signed)
Post Partum Day 2 Subjective: no complaints and would like to go home today.  Objective: Blood pressure 127/79, pulse 60, temperature 97.6 F (36.4 C), temperature source Oral, resp. rate 18, height  (1.753 m), weight 140 lb (63.504 kg), last menstrual period 05/29/2014, SpO2 100 %, unknown if currently breastfeeding. BP range 111/70-134/93  Physical Exam:  General: alert, cooperative and no distress Lochia: appropriate Uterine Fundus: firm/ U-1/ ML/NT DVT Evaluation: No evidence of DVT seen on physical exam.   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  Labile blood pressures  P: will repeat BUN and CR today and if stable, can discharge home and followup in 1 week for BP check, cr, and Nexplanon insertion.  LOS: 2 days   Joyce [name] 02/14/2015, 11:25 AM

## 2015-02-14 NOTE — Discharge Summary (Signed)
Physician Obstetric Discharge Summary  Patient ID: Joyce [name] MRN: [id number] DOB/AGE: May 06, 1996 19 y.o.   Date of Admission: 02/12/2015  Date of delivery: 02/12/2015  Date of Discharge: 02/14/2015  Admitting Diagnosis: Induction of labor at [redacted]w[redacted]d  Secondary Diagnosis: Gestational hypertension, Renal dysfunction  Mode of Delivery: normal spontaneous vaginal delivery      Discharge Diagnosis: Gestational hypertension   Intrapartum Procedures: Atificial rupture of membranes   Post partum procedures: rhogam  Complications: elevated creatinine/ renal dysfunction   Brief Hospital Course  Joyce [name] is a [id number] who had a SVD on 02/12/2015 after an IOL for gestational hypertension;  for further details of this delivery, please refer to the delivery note.  Patient had an postpartum course remarkable for rising creatinine levels. Creatinine rose from 1.15 to 1.33, however, she had been taking Motrin for pain. This medicine was discontinued per Dr Garnett Farm recommendations and patient to follow up for creatinine level with [facility] in the next 1-2 days. She had some labile blood pressures ranging from 111/70 to 134/93. Platelets were 127000 on admission and 133,000 on discharge.  By time of discharge on PPD#2, her pain was controlled on oral pain medications; she had appropriate lochia and was ambulating, voiding without difficulty and tolerating regular diet.  She had received Rhogam on 8/16 as her baby was O POS. She was deemed stable for discharge to home.     CBC Latest Ref Rng 02/13/2015 02/12/2015 02/12/2015  WBC 3.6 - 11.0 K/uL - - 11.0  Hemoglobin 12.0 - 16.0 g/dL 10.4(L) - 11.5(L)  Hematocrit 35.0 - 47.0 % 32.0(L) - 35.1  Platelets 150 - 440 K/uL - 133(L) 150   O NEG  Physical exam:  Blood pressure 127/79, pulse 60, temperature 98.2 F (36.8 C), temperature source Oral, resp. rate 18, height 5\' 9"  (1.753 m), weight 140 lb (63.504 kg), last  menstrual period 05/29/2014, SpO2 100 %, unknown if currently breastfeeding. General: alert and no distress Lochia: appropriate Abdomen: soft, NT Uterine Fundus: firm Extremities: No evidence of DVT seen on physical exam. No lower extremity edema.  Discharge Instructions: Per After Visit Summary. Activity: Advance as tolerated. Pelvic rest for 6 weeks.  Also refer to Discharge Instructions Diet: Regular Medications:   Medication List    STOP taking these medications        aspirin 81 MG chewable tablet      TAKE these medications        acetaminophen 325 MG tablet  Commonly known as:  TYLENOL  Take 2 tablets (650 mg total) by mouth every 4 (four) hours as needed (for pain scale < 4).     benzocaine-Menthol 20-0.5 % Aero  Commonly known as:  DERMOPLAST  Apply 1 application topically as needed for irritation (perineal discomfort).     docusate sodium 100 MG capsule  Commonly known as:  COLACE  Take 1 capsule (100 mg total) by mouth 2 (two) times daily as needed for mild constipation.     oxyCODONE-acetaminophen 5-325 MG per tablet  Commonly known as:  PERCOCET/ROXICET  Take 1-2 tablets by mouth every 6 (six) hours as needed (for pain scale 4-7).     prenatal multivitamin Tabs tablet  Take 1 tablet by mouth daily at 12 noon.       Outpatient follow up:  Follow-up Information    Follow up with Conard Novak, MD. Schedule an appointment as soon as possible for a visit in 1 week.   Specialty:  Obstetrics and Gynecology   Why:  BP check and Placement of nexplanon (needs 30 minute appointment) in 1-2 weeks   Contact information:   [address] [place] [address] [phone]       Follow up In 1 week.     Postpartum contraception: Nexplanon  Discharged Condition: stable  Discharged to: home   Newborn Data: Disposition:home with mother  Apgars: APGAR (1 MIN): 9   APGAR (5 MINS): 9   APGAR (10 MINS):    Baby Feeding: Bottle/  Fae Pippin, CNM 02/14/2015 1:28 PM

## 2016-11-04 DIAGNOSIS — Z79899 Other long term (current) drug therapy: Secondary | ICD-10-CM | POA: Diagnosis not present

## 2016-11-04 DIAGNOSIS — R0600 Dyspnea, unspecified: Secondary | ICD-10-CM | POA: Diagnosis not present

## 2016-11-04 DIAGNOSIS — R51 Headache: Secondary | ICD-10-CM | POA: Diagnosis not present

## 2016-11-04 DIAGNOSIS — F419 Anxiety disorder, unspecified: Secondary | ICD-10-CM | POA: Diagnosis not present

## 2016-11-04 DIAGNOSIS — R001 Bradycardia, unspecified: Secondary | ICD-10-CM | POA: Diagnosis not present

## 2016-11-04 DIAGNOSIS — J449 Chronic obstructive pulmonary disease, unspecified: Secondary | ICD-10-CM | POA: Diagnosis not present

## 2018-02-23 DIAGNOSIS — R51 Headache: Secondary | ICD-10-CM | POA: Diagnosis not present

## 2018-02-23 DIAGNOSIS — F419 Anxiety disorder, unspecified: Secondary | ICD-10-CM | POA: Diagnosis not present

## 2018-02-23 DIAGNOSIS — E559 Vitamin D deficiency, unspecified: Secondary | ICD-10-CM | POA: Diagnosis not present

## 2018-02-23 DIAGNOSIS — R001 Bradycardia, unspecified: Secondary | ICD-10-CM | POA: Diagnosis not present

## 2018-02-23 DIAGNOSIS — Z1331 Encounter for screening for depression: Secondary | ICD-10-CM | POA: Diagnosis not present

## 2018-02-23 DIAGNOSIS — E7800 Pure hypercholesterolemia, unspecified: Secondary | ICD-10-CM | POA: Diagnosis not present

## 2018-02-23 DIAGNOSIS — Z716 Tobacco abuse counseling: Secondary | ICD-10-CM | POA: Diagnosis not present

## 2018-03-15 ENCOUNTER — Ambulatory Visit: Payer: Self-pay | Admitting: Obstetrics and Gynecology

## 2018-03-16 ENCOUNTER — Encounter: Payer: Self-pay | Admitting: Obstetrics and Gynecology

## 2018-03-16 ENCOUNTER — Ambulatory Visit (INDEPENDENT_AMBULATORY_CARE_PROVIDER_SITE_OTHER): Payer: Medicaid Other | Admitting: Obstetrics and Gynecology

## 2018-03-16 VITALS — BP 108/78 | Ht 69.0 in | Wt 123.0 lb

## 2018-03-16 DIAGNOSIS — Z3049 Encounter for surveillance of other contraceptives: Secondary | ICD-10-CM

## 2018-03-16 DIAGNOSIS — Z30013 Encounter for initial prescription of injectable contraceptive: Secondary | ICD-10-CM

## 2018-03-16 DIAGNOSIS — Z3046 Encounter for surveillance of implantable subdermal contraceptive: Secondary | ICD-10-CM

## 2018-03-16 MED ORDER — MEDROXYPROGESTERONE ACETATE 150 MG/ML IM SUSY: 150.0000 mg | PREFILLED_SYRINGE | Freq: Once | INTRAMUSCULAR | 0 refills | Status: DC

## 2018-03-16 NOTE — Patient Instructions (Signed)
I value your feedback and entrusting us with your care. If you get a [facility] patient survey, I would appreciate you taking the time to let us know about your experience today. Thank you!  Remove the dressing in 24 hours,  keep the incision area dry for 24 hours and remove the Steristrip in 2-3  days.  Notify us if any signs of tenderness, redness, pain, or fevers develop.  

## 2018-03-16 NOTE — Progress Notes (Signed)
   Chief Complaint  Patient presents with  . Contraception    Nexplanon removal, wants BC options     History of Present Illness:  [name] is a 22 y.o. that had a nexplanon placed approximately 3 years  ago. Since that time, she has had irregular bleeding. It is due to be removed. She would like to change to depo. Has past issues with HTN but BP ok today. Not taking meds. Hx of frequent migraines, no DVTs.  Past due for annual/pap.  Past Medical History:  Diagnosis Date  . Migraine   . Renal dysfunction   . Severe preeclampsia   . Short interval between pregnancies complicating pregnancy in third trimester, antepartum    Past Surgical History:  Procedure Laterality Date  . THYROID SURGERY     History reviewed. No pertinent family history.  Vitals:   03/16/18 0949  Weight: 123 lb (55.8 kg)  Height: 5\' 9"  (1.753 m)   Review of Systems  Constitutional: Negative for fever.  Gastrointestinal: Negative for blood in stool, constipation, diarrhea, nausea and vomiting.  Genitourinary: Positive for menstrual problem. Negative for dyspareunia, dysuria, flank pain, frequency, hematuria, urgency, vaginal bleeding, vaginal discharge and vaginal pain.  Musculoskeletal: Negative for back pain.  Skin: Negative for rash.   Physical Exam  Constitutional: She is oriented to person, place, and time. She appears well-developed.  Neck: Normal range of motion.  Pulmonary/Chest: Effort normal.  Musculoskeletal: Normal range of motion.  Neurological: She is alert and oriented to person, place, and time. She has normal reflexes. No cranial nerve deficit.  Skin: Skin is warm.   Nexplanon removal Procedure note - The Nexplanon was noted in the patient's arm and the end was identified. The skin was cleansed with a Betadine solution. A small injection of subcutaneous lidocaine with epinephrine was given over the end of the implant. An incision was made at the end of the implant. The rod was  noted in the incision and grasped with a hemostat. It was noted to be intact.  Steri-Strip was placed approximating the incision. Hemostasis was noted.  Assessment:  Nexplanon removal  Encounter for initial prescription of injectable contraceptive - Prog only BC options discussed. Pt would like depo. Rx sent to pharm. RTO for UPT and depo inj. Condoms. RTO in 1 mo for f/u and annual.  - Plan: medroxyPROGESTERone Acetate 150 MG/ML SUSY    Plan:   She was told to remove the dressing in 12-24 hours, to keep the incision area dry for 24 hours and to remove the Steristrip in 2-3  days.  Notify us if any signs of tenderness, redness, pain, or fevers develop.  Return in about 1 month (around 04/15/2018) for annual.  Lydie Stammen B. Coty Student, PA-C 03/16/2018 11:10 AM

## 2018-04-15 ENCOUNTER — Telehealth: Payer: Self-pay | Admitting: Obstetrics and Gynecology

## 2018-04-15 ENCOUNTER — Ambulatory Visit: Payer: Medicaid Other

## 2018-04-15 DIAGNOSIS — Z3201 Encounter for pregnancy test, result positive: Secondary | ICD-10-CM

## 2018-04-15 DIAGNOSIS — Z3042 Encounter for surveillance of injectable contraceptive: Secondary | ICD-10-CM

## 2018-04-15 LAB — POCT URINE PREGNANCY: PREG TEST UR: POSITIVE — AB

## 2018-04-15 NOTE — Progress Notes (Signed)
Pt here for depo and urine preg test.  Urine preg test was positive.  Depo given back to pt for later use.  Pt states she had a feeling she was pregnant.  Adv to cancel annual exam appt, move it out a week or two more and schedule NOB.  Pt voices understanding.

## 2018-04-15 NOTE — Telephone Encounter (Signed)
-----   Message from Rica Records, PA-C sent at 04/15/2018  9:41 AM EDT ----- Pls change her appt to NOB with different provider. Thx.

## 2018-04-15 NOTE — Telephone Encounter (Signed)
Voicemail box is not set up. Unable to leave message. My chart not activated

## 2018-04-15 NOTE — Progress Notes (Signed)
Pls change her appt to NOB with different provider. Thx.

## 2018-05-04 ENCOUNTER — Ambulatory Visit: Payer: Medicaid Other | Admitting: Obstetrics and Gynecology

## 2018-05-18 ENCOUNTER — Ambulatory Visit (INDEPENDENT_AMBULATORY_CARE_PROVIDER_SITE_OTHER): Payer: Medicaid Other

## 2018-05-18 DIAGNOSIS — Z3042 Encounter for surveillance of injectable contraceptive: Secondary | ICD-10-CM | POA: Diagnosis not present

## 2018-05-18 MED ORDER — MEDROXYPROGESTERONE ACETATE 150 MG/ML IM SUSP
150.0000 mg | Freq: Once | INTRAMUSCULAR | Status: AC
  Administered 2018-05-18: 150 mg via INTRAMUSCULAR

## 2018-08-10 ENCOUNTER — Telehealth: Payer: Self-pay | Admitting: Obstetrics and Gynecology

## 2018-08-10 ENCOUNTER — Other Ambulatory Visit: Payer: Self-pay

## 2018-08-10 ENCOUNTER — Ambulatory Visit: Payer: Medicaid Other

## 2018-08-10 ENCOUNTER — Other Ambulatory Visit: Payer: Self-pay | Admitting: Obstetrics and Gynecology

## 2018-08-10 DIAGNOSIS — Z30013 Encounter for initial prescription of injectable contraceptive: Secondary | ICD-10-CM

## 2018-08-10 MED ORDER — MEDROXYPROGESTERONE ACETATE 150 MG/ML IM SUSY: 150.0000 mg | PREFILLED_SYRINGE | Freq: Once | INTRAMUSCULAR | 0 refills | Status: DC

## 2018-08-10 NOTE — Telephone Encounter (Signed)
Patient is schedule 08/24/2018 for Annual with [facility]. Patient is schedule for depo on 08/11/18. Patient is requesting refill and to be notified it has been filled. Please advise

## 2018-08-10 NOTE — Telephone Encounter (Signed)
RF has been sent to pharmacy. Tried multiple times contacting patient, no ringtone heard.

## 2018-08-11 ENCOUNTER — Ambulatory Visit: Payer: Medicaid Other

## 2018-08-13 ENCOUNTER — Ambulatory Visit (INDEPENDENT_AMBULATORY_CARE_PROVIDER_SITE_OTHER): Payer: Medicaid Other

## 2018-08-13 DIAGNOSIS — Z3042 Encounter for surveillance of injectable contraceptive: Secondary | ICD-10-CM

## 2018-08-13 MED ORDER — MEDROXYPROGESTERONE ACETATE 150 MG/ML IM SUSP
150.0000 mg | Freq: Once | INTRAMUSCULAR | Status: AC
  Administered 2018-08-13: 150 mg via INTRAMUSCULAR

## 2018-08-24 ENCOUNTER — Ambulatory Visit (INDEPENDENT_AMBULATORY_CARE_PROVIDER_SITE_OTHER): Payer: Medicaid Other | Admitting: Obstetrics and Gynecology

## 2018-08-24 ENCOUNTER — Encounter: Payer: Self-pay | Admitting: Obstetrics and Gynecology

## 2018-08-24 ENCOUNTER — Other Ambulatory Visit (HOSPITAL_COMMUNITY)
Admission: RE | Admit: 2018-08-24 | Discharge: 2018-08-24 | Disposition: A | Discharge status: Home / Self Care | Payer: Medicaid Other | Source: Ambulatory Visit | Attending: Obstetrics and Gynecology | Admitting: Obstetrics and Gynecology

## 2018-08-24 VITALS — BP 120/70 | Ht 69.0 in | Wt 126.0 lb

## 2018-08-24 DIAGNOSIS — Z124 Encounter for screening for malignant neoplasm of cervix: Secondary | ICD-10-CM

## 2018-08-24 DIAGNOSIS — Z113 Encounter for screening for infections with a predominantly sexual mode of transmission: Secondary | ICD-10-CM | POA: Diagnosis present

## 2018-08-24 DIAGNOSIS — Z0000 Encounter for general adult medical examination without abnormal findings: Secondary | ICD-10-CM | POA: Diagnosis not present

## 2018-08-24 DIAGNOSIS — Z01419 Encounter for gynecological examination (general) (routine) without abnormal findings: Secondary | ICD-10-CM

## 2018-08-24 DIAGNOSIS — Z3042 Encounter for surveillance of injectable contraceptive: Secondary | ICD-10-CM

## 2018-08-24 MED ORDER — MEDROXYPROGESTERONE ACETATE 150 MG/ML IM SUSY: 150.0000 mg | PREFILLED_SYRINGE | Freq: Once | INTRAMUSCULAR | 3 refills | Status: DC

## 2018-08-24 NOTE — Progress Notes (Signed)
PCP:  Evelene Croon, MD   Chief Complaint  Patient presents with  . Gynecologic Exam     HPI:      [name] is a 23 y.o. [id number] who LMP was Patient's last menstrual period was 08/13/2018 (exact date)., presents today for her annual examination.  Her menses are irregular bleeding/spotting since starting depo 04/15/18. Just got 2nd shot 08/13/18. Having some mood changes, decreased sex drive. Would like to give it more time, however. Had nexplanon removed 9/19, not sure if she would want IUD. No other options interest her.  Sex activity: single partner, contraception - Depo-Provera injections.  Last Pap: not recent Hx of STDs: none  There is no FH of breast cancer. There is no FH of ovarian cancer. The patient does do self-breast exams.  Tobacco use: The patient denies current or previous tobacco use. Alcohol use: none No drug use.  Exercise: moderately active  She does get adequate calcium and Vitamin D in her diet.   Past Medical History:  Diagnosis Date  . Migraine   . Renal dysfunction   . Severe preeclampsia   . Short interval between pregnancies complicating pregnancy in third trimester, antepartum     Past Surgical History:  Procedure Laterality Date  . THYROID SURGERY      History reviewed. No pertinent family history.  Social History   Socioeconomic History  . Marital status: Single    Spouse name: Not on file  . Number of children: Not on file  . Years of education: Not on file  . Highest education level: Not on file  Occupational History  . Not on file  Social Needs  . Financial resource strain: Not on file  . Food insecurity:    Worry: Not on file    Inability: Not on file  . Transportation needs:    Medical: Not on file    Non-medical: Not on file  Tobacco Use  . Smoking status: Never Smoker  . Smokeless tobacco: Never Used  Substance and Sexual Activity  . Alcohol use: No  . Drug use: No  . Sexual activity: Yes    Birth  control/protection: Injection  Lifestyle  . Physical activity:    Days per week: Not on file    Minutes per session: Not on file  . Stress: Not on file  Relationships  . Social connections:    Talks on phone: Not on file    Gets together: Not on file    Attends religious service: Not on file    Active member of club or organization: Not on file    Attends meetings of clubs or organizations: Not on file    Relationship status: Not on file  . Intimate partner violence:    Fear of current or ex partner: Not on file    Emotionally abused: Not on file    Physically abused: Not on file    Forced sexual activity: Not on file  Other Topics Concern  . Not on file  Social History Narrative  . Not on file    Outpatient Medications Prior to Visit  Medication Sig Dispense Refill  . medroxyPROGESTERone Acetate 150 MG/ML SUSY Inject 1 mL (150 mg total) into the muscle once for 1 dose. 1 Syringe 0   No facility-administered medications prior to visit.       ROS:  Review of Systems  Constitutional: Positive for fatigue. Negative for fever and unexpected weight change.  Respiratory: Negative for cough, shortness  of breath and wheezing.   Cardiovascular: Negative for chest pain, palpitations and leg swelling.  Gastrointestinal: Negative for blood in stool, constipation, diarrhea, nausea and vomiting.  Endocrine: Negative for cold intolerance, heat intolerance and polyuria.  Genitourinary: Positive for dyspareunia and vaginal bleeding. Negative for dysuria, flank pain, frequency, genital sores, hematuria, menstrual problem, pelvic pain, urgency, vaginal discharge and vaginal pain.  Musculoskeletal: Negative for back pain, joint swelling and myalgias.  Skin: Negative for rash.  Neurological: Positive for headaches. Negative for dizziness, syncope, light-headedness and numbness.  Hematological: Negative for adenopathy.  Psychiatric/Behavioral: Negative for agitation, confusion, sleep  disturbance and suicidal ideas. The patient is not nervous/anxious.    BREAST: No symptoms   Objective: BP 120/70   Ht 5\' 9"  (1.753 m)   Wt 126 lb (57.2 kg)   LMP 08/13/2018 (Exact Date)   BMI 18.61 kg/m    Physical Exam Constitutional:      Appearance: She is well-developed.  Genitourinary:     Vulva, vagina, cervix, uterus, right adnexa and left adnexa normal.     No vulval lesion or tenderness noted.     No vaginal discharge, erythema or tenderness.     No cervical polyp.     Uterus is not enlarged or tender.     No right or left adnexal mass present.     Right adnexa not tender.     Left adnexa not tender.  Neck:     Musculoskeletal: Normal range of motion.     Thyroid: No thyromegaly.  Cardiovascular:     Rate and Rhythm: Normal rate and regular rhythm.     Heart sounds: Normal heart sounds. No murmur.  Pulmonary:     Effort: Pulmonary effort is normal.     Breath sounds: Normal breath sounds.  Chest:     Breasts:        Right: No mass, nipple discharge, skin change or tenderness.        Left: No mass, nipple discharge, skin change or tenderness.  Abdominal:     Palpations: Abdomen is soft.     Tenderness: There is no abdominal tenderness. There is no guarding.  Musculoskeletal: Normal range of motion.  Neurological:     General: No focal deficit present.     Mental Status: She is alert and oriented to person, place, and time.     Cranial Nerves: No cranial nerve deficit.  Skin:    General: Skin is warm and dry.  Psychiatric:        Mood and Affect: Mood normal.        Behavior: Behavior normal.        Thought Content: Thought content normal.        Judgment: Judgment normal.  Vitals signs reviewed.     Assessment/Plan: Encounter for annual routine gynecological examination  Cervical cancer screening - Plan: Cytology - PAP  Screening for STD (sexually transmitted disease) - Plan: Cytology - PAP  Encounter for surveillance of injectable  contraceptive - Rx RF depo. Cont ca/Vit D. - Plan: medroxyPROGESTERone Acetate 150 MG/ML SUSY  Meds ordered this encounter  Medications  . medroxyPROGESTERone Acetate 150 MG/ML SUSY    Sig: Inject 1 mL (150 mg total) into the muscle once for 1 dose.    Dispense:  1 Syringe    Refill:  3    Order Specific Question:   Supervising Provider    Answer:   Nadara Mustard [phone]  GYN counsel family planning choices, adequate intake of calcium and vitamin D, diet and exercise     F/U  Return in about 1 year (around 08/25/2019).  Eve Rey B. Ambrielle Kington, PA-C 08/24/2018 4:44 PM

## 2018-08-24 NOTE — Patient Instructions (Signed)
I value your feedback and entrusting us with your care. If you get a [facility] patient survey, I would appreciate you taking the time to let us know about your experience today. Thank you! 

## 2018-08-27 LAB — CYTOLOGY - PAP
CHLAMYDIA, DNA PROBE: NEGATIVE
NEISSERIA GONORRHEA: NEGATIVE

## 2018-11-04 ENCOUNTER — Ambulatory Visit: Payer: Medicaid Other | Admitting: Obstetrics and Gynecology

## 2018-11-05 ENCOUNTER — Ambulatory Visit: Payer: Medicaid Other

## 2018-11-09 ENCOUNTER — Ambulatory Visit: Payer: Medicaid Other

## 2018-11-10 ENCOUNTER — Other Ambulatory Visit: Payer: Self-pay

## 2018-11-10 ENCOUNTER — Ambulatory Visit (INDEPENDENT_AMBULATORY_CARE_PROVIDER_SITE_OTHER): Payer: Medicaid Other

## 2018-11-10 ENCOUNTER — Ambulatory Visit: Payer: Medicaid Other | Admitting: Obstetrics and Gynecology

## 2018-11-10 DIAGNOSIS — Z3042 Encounter for surveillance of injectable contraceptive: Secondary | ICD-10-CM | POA: Diagnosis not present

## 2018-11-10 MED ORDER — MEDROXYPROGESTERONE ACETATE 150 MG/ML IM SUSP
150.0000 mg | Freq: Once | INTRAMUSCULAR | Status: AC
  Administered 2018-11-10: 150 mg via INTRAMUSCULAR

## 2018-11-10 NOTE — Progress Notes (Signed)
Evelene Croon, MD   Chief Complaint  Patient presents with  . Abnormal Pap Smear    repeat, headaches nonstop for about a week    HPI:      [name] is a 23 y.o. [id number] who LMP was No LMP recorded. Patient has had an injection., presents today for repeat pap smear from 2/20 annual due to unsatisfactory results. Had neg STD testing at that time. Due for repeat pap smear. Declines repeat STD testing.  Has had tension headaches, worse for past wk. Takes NSAIDs with minimal relief this wk. Sunlight, wearing hair pulled back are triggers. Hx of migraines in past but feels different. Not spending too much time in bed (due to Covid) or sleeping, trying to stay active. Increased stress recently.    Past Medical History:  Diagnosis Date  . Migraine   . Renal dysfunction   . Severe preeclampsia   . Short interval between pregnancies complicating pregnancy in third trimester, antepartum     Past Surgical History:  Procedure Laterality Date  . THYROID SURGERY      History reviewed. No pertinent family history.  Social History   Socioeconomic History  . Marital status: Single    Spouse name: Not on file  . Number of children: Not on file  . Years of education: Not on file  . Highest education level: Not on file  Occupational History  . Not on file  Social Needs  . Financial resource strain: Not on file  . Food insecurity:    Worry: Not on file    Inability: Not on file  . Transportation needs:    Medical: Not on file    Non-medical: Not on file  Tobacco Use  . Smoking status: Never Smoker  . Smokeless tobacco: Never Used  Substance and Sexual Activity  . Alcohol use: No  . Drug use: No  . Sexual activity: Yes    Birth control/protection: Injection  Lifestyle  . Physical activity:    Days per week: Not on file    Minutes per session: Not on file  . Stress: Not on file  Relationships  . Social connections:    Talks on phone: Not on file    Gets  together: Not on file    Attends religious service: Not on file    Active member of club or organization: Not on file    Attends meetings of clubs or organizations: Not on file    Relationship status: Not on file  . Intimate partner violence:    Fear of current or ex partner: Not on file    Emotionally abused: Not on file    Physically abused: Not on file    Forced sexual activity: Not on file  Other Topics Concern  . Not on file  Social History Narrative  . Not on file    Outpatient Medications Prior to Visit  Medication Sig Dispense Refill  . medroxyPROGESTERone Acetate 150 MG/ML SUSY Inject 1 mL (150 mg total) into the muscle once for 1 dose. 1 Syringe 3   No facility-administered medications prior to visit.       ROS:  Review of Systems  Constitutional: Negative for fever.  Gastrointestinal: Negative for blood in stool, constipation, diarrhea, nausea and vomiting.  Genitourinary: Negative for dyspareunia, dysuria, flank pain, frequency, hematuria, urgency, vaginal bleeding, vaginal discharge and vaginal pain.  Musculoskeletal: Negative for back pain.  Skin: Negative for rash.  Neurological: Positive for headaches.  BREAST: No symptoms   OBJECTIVE:   Vitals:  BP 110/84   Ht 5\' 9"  (1.753 m)   Wt 132 lb (59.9 kg)   BMI 19.49 kg/m   Physical Exam Vitals signs reviewed.  Constitutional:      Appearance: She is well-developed.  Neck:     Musculoskeletal: Normal range of motion.  Pulmonary:     Effort: Pulmonary effort is normal.  Genitourinary:    General: Normal vulva.     Pubic Area: No rash.      Labia:        Right: No rash, tenderness or lesion.        Left: No rash, tenderness or lesion.      Vagina: Normal. No vaginal discharge, erythema or tenderness.     Cervix: Normal.     Uterus: Normal. Not enlarged and not tender.      Adnexa: Right adnexa normal and left adnexa normal.       Right: No mass or tenderness.         Left: No mass or  tenderness.    Musculoskeletal: Normal range of motion.  Skin:    General: Skin is warm and dry.  Neurological:     General: No focal deficit present.     Mental Status: She is alert and oriented to person, place, and time.  Psychiatric:        Mood and Affect: Mood normal.        Behavior: Behavior normal.        Thought Content: Thought content normal.        Judgment: Judgment normal.     Assessment/Plan: Cervical cancer screening - Will call with results.  - Plan: Cytology - PAP  Unsatisfactory cervical cytology smear - Plan: Cytology - PAP  Tension headache - Massage/heat and ice/stretch/ add magnesium 500 mg QD; can try valerian root 500 mg QD-BID prn. F/u prn.      Return if symptoms worsen or fail to improve.  Alicia B. Copland, PA-C 11/11/2018 3:29 PM

## 2018-11-10 NOTE — Progress Notes (Signed)
Patient presents today for Depo Provera injection within dates. Given IM RUOQ. Patient tolerated well. 

## 2018-11-11 ENCOUNTER — Other Ambulatory Visit (HOSPITAL_COMMUNITY)
Admission: RE | Admit: 2018-11-11 | Discharge: 2018-11-11 | Disposition: A | Discharge status: Home / Self Care | Payer: Medicaid Other | Source: Ambulatory Visit | Attending: Obstetrics and Gynecology | Admitting: Obstetrics and Gynecology

## 2018-11-11 ENCOUNTER — Encounter: Payer: Self-pay | Admitting: Obstetrics and Gynecology

## 2018-11-11 ENCOUNTER — Ambulatory Visit (INDEPENDENT_AMBULATORY_CARE_PROVIDER_SITE_OTHER): Payer: Medicaid Other | Admitting: Obstetrics and Gynecology

## 2018-11-11 VITALS — BP 110/84 | Ht 69.0 in | Wt 132.0 lb

## 2018-11-11 DIAGNOSIS — G44209 Tension-type headache, unspecified, not intractable: Secondary | ICD-10-CM

## 2018-11-11 DIAGNOSIS — Z124 Encounter for screening for malignant neoplasm of cervix: Secondary | ICD-10-CM

## 2018-11-11 DIAGNOSIS — R87615 Unsatisfactory cytologic smear of cervix: Secondary | ICD-10-CM | POA: Insufficient documentation

## 2018-11-11 HISTORY — DX: Tension-type headache, unspecified, not intractable: G44.209

## 2018-11-11 NOTE — Patient Instructions (Signed)
I value your feedback and entrusting us with your care. If you get a [facility] patient survey, I would appreciate you taking the time to let us know about your experience today. Thank you! 

## 2018-11-15 LAB — CYTOLOGY - PAP: Diagnosis: NEGATIVE

## 2018-12-02 ENCOUNTER — Other Ambulatory Visit: Payer: Self-pay

## 2018-12-02 ENCOUNTER — Ambulatory Visit
Admission: EM | Admit: 2018-12-02 | Discharge: 2018-12-02 | Disposition: A | Discharge status: Home / Self Care | Payer: Medicaid Other | Attending: Emergency Medicine | Admitting: Emergency Medicine

## 2018-12-02 DIAGNOSIS — H00014 Hordeolum externum left upper eyelid: Secondary | ICD-10-CM

## 2018-12-02 DIAGNOSIS — R030 Elevated blood-pressure reading, without diagnosis of hypertension: Secondary | ICD-10-CM

## 2018-12-02 MED ORDER — ERYTHROMYCIN 5 MG/GM OP OINT: TOPICAL_OINTMENT | OPHTHALMIC | 0 refills | Status: DC

## 2018-12-02 NOTE — ED Triage Notes (Signed)
States left eye was itching yesterday  And then become painful, pt states it had crusted over this morning

## 2018-12-02 NOTE — ED Provider Notes (Signed)
[facility]   [id number] 12/02/18 Arrival Time: 1303  CC: Painful eyelid and elevated blood pressure  SUBJECTIVE:  Joyce [name] is a 23 y.o. female hx significant for renal dysfunction, and severe preeclampsia, who presents with complaint of eye lid pain, mild redness, and swelling x 1 day.  Denies a precipitating event, trauma, or close contacts with similar symptoms.  Speculates possible FB in eye.  Has NOT tried OTC eye drops.  Symptoms are made worse with blinking her eye.  Denies similar symptoms in the past. Complains of itching, and mild clear drainage this morning.  Denies fever, chills, nausea, vomiting, eye pain, painful eye movements, halos, discharge, vision changes, double vision, periorbital erythema.     Denies contact lens use.    Pt reports hx of elevated blood pressure with pregnancies in the past.  Blood pressure elevated in office of 160/110.  Does not take blood pressure medication.  Does not have a PCP.  Denies HA, vision changes, chest pain, SOB.    ROS: As per HPI.  Past Medical History:  Diagnosis Date  . Migraine   . Renal dysfunction   . Severe preeclampsia   . Short interval between pregnancies complicating pregnancy in third trimester, antepartum    Past Surgical History:  Procedure Laterality Date  . THYROID SURGERY     No Known Allergies No current facility-administered medications on file prior to encounter.    Current Outpatient Medications on File Prior to Encounter  Medication Sig Dispense Refill  . medroxyPROGESTERone Acetate 150 MG/ML SUSY Inject 1 mL (150 mg total) into the muscle once for 1 dose. 1 Syringe 3   Social History   Socioeconomic History  . Marital status: Single    Spouse name: Not on file  . Number of children: Not on file  . Years of education: Not on file  . Highest education level: Not on file  Occupational History  . Not on file  Social Needs  . Financial resource strain: Not on file  . Food  insecurity:    Worry: Not on file    Inability: Not on file  . Transportation needs:    Medical: Not on file    Non-medical: Not on file  Tobacco Use  . Smoking status: Never Smoker  . Smokeless tobacco: Never Used  Substance and Sexual Activity  . Alcohol use: No  . Drug use: No  . Sexual activity: Yes    Birth control/protection: Injection  Lifestyle  . Physical activity:    Days per week: Not on file    Minutes per session: Not on file  . Stress: Not on file  Relationships  . Social connections:    Talks on phone: Not on file    Gets together: Not on file    Attends religious service: Not on file    Active member of club or organization: Not on file    Attends meetings of clubs or organizations: Not on file    Relationship status: Not on file  . Intimate partner violence:    Fear of current or ex partner: Not on file    Emotionally abused: Not on file    Physically abused: Not on file    Forced sexual activity: Not on file  Other Topics Concern  . Not on file  Social History Narrative  . Not on file   History reviewed. No pertinent family history.  OBJECTIVE:  Vitals:   12/02/18 1315  BP: (!) 160/110  Pulse:  81  Resp: 20  Temp: 99.1 F (37.3 C)    General appearance: alert; no distress Eyes: Mild erythema to external upper left eyelid with early stye formation, mildly TTP; no conjunctival erythema. PERRL; EOMI without discomfort;  no obvious drainage; lid everted without obvious FB; no obvious fluorescein uptake  Neck: supple Lungs: clear to auscultation bilaterally Heart: regular rate and rhythm Skin: warm and dry Psychological: alert and cooperative; normal mood and affect  ASSESSMENT & PLAN:  1. Hordeolum externum of left upper eyelid   2. Elevated blood pressure reading     Meds ordered this encounter  Medications  . erythromycin ophthalmic ointment    Sig: Place a 1/2 inch ribbon of ointment to the upper eyelid.    Dispense:  3.5 g     Refill:  0    Order Specific Question:   Supervising Provider    Answer:   Eustace MooreELSON, YVONNE SUE [[id number]]   Continue warm compresses at home.  Soak a wash cloth in warm (not scalding) water and place it over the eyes. As the wash cloth cools, it should be rewarmed and replaced for a total of 5 to 10 minutes of soaking time. Warm compresses should be applied two to four times a day as long as the patient has symptoms Perform lid washing: Either warm water or very dilute baby shampoo can be placed on a clean wash cloth, gauze pad, or cotton swab. Then be advised to gently clean along the lashes and lid margin to remove the accumulated material with care to avoid contacting the ocular surface. If shampoo is used, thorough rinsing is recommended. Vigorous washing should be avoided, as it may cause more irritation.  Prescribed erythromycin ointment.  Apply up to 6 times daily for 5-7 days, or until symptomatic improvement Follow up with ophthalmology for further evaluation and management if symptoms persists Return or go to ER if you have any new or worsening symptoms such as fever, chills, redness, swelling, eye pain, painful eye movements, vision changes, etc...  Blood pressure elevated in office.  Please recheck in 24 hours.  If it continues to be greater than 140/90 please follow up with PCP for further evaluation and management.  Information for local PCPs in the area attached.    Reviewed expectations re: course of current medical issues. Questions answered. Outlined signs and symptoms indicating need for more acute intervention. Patient verbalized understanding. After Visit Summary given.   Rennis HardingWurst, Joyce [place], PA-C 12/02/18 1417

## 2018-12-02 NOTE — Discharge Instructions (Signed)
Continue warm compresses at home.  Soak a wash cloth in warm (not scalding) water and place it over the eyes. As the wash cloth cools, it should be rewarmed and replaced for a total of 5 to 10 minutes of soaking time. Warm compresses should be applied two to four times a day as long as the patient has symptoms Perform lid washing: Either warm water or very dilute baby shampoo can be placed on a clean wash cloth, gauze pad, or cotton swab. Then be advised to gently clean along the lashes and lid margin to remove the accumulated material with care to avoid contacting the ocular surface. If shampoo is used, thorough rinsing is recommended. Vigorous washing should be avoided, as it may cause more irritation.  Prescribed erythromycin ointment.  Apply up to 6 times daily for 5-7 days, or until symptomatic improvement Follow up with ophthalmology for further evaluation and management if symptoms persists Return or go to ER if you have any new or worsening symptoms such as fever, chills, redness, swelling, eye pain, painful eye movements, vision changes, etc...  Blood pressure elevated in office.  Please recheck in 24 hours.  If it continues to be greater than 140/90 please follow up with PCP for further evaluation and management.  Information for local PCPs in the area attached.

## 2018-12-09 ENCOUNTER — Telehealth: Payer: Self-pay | Admitting: *Deleted

## 2018-12-09 ENCOUNTER — Other Ambulatory Visit: Payer: Self-pay

## 2018-12-09 ENCOUNTER — Ambulatory Visit
Admission: EM | Admit: 2018-12-09 | Discharge: 2018-12-09 | Disposition: A | Discharge status: Home / Self Care | Payer: Medicaid Other | Attending: Emergency Medicine | Admitting: Emergency Medicine

## 2018-12-09 DIAGNOSIS — R51 Headache: Secondary | ICD-10-CM | POA: Diagnosis not present

## 2018-12-09 DIAGNOSIS — Z20822 Contact with and (suspected) exposure to covid-19: Secondary | ICD-10-CM

## 2018-12-09 DIAGNOSIS — J029 Acute pharyngitis, unspecified: Secondary | ICD-10-CM | POA: Insufficient documentation

## 2018-12-09 DIAGNOSIS — Z20828 Contact with and (suspected) exposure to other viral communicable diseases: Secondary | ICD-10-CM

## 2018-12-09 DIAGNOSIS — R6889 Other general symptoms and signs: Secondary | ICD-10-CM | POA: Insufficient documentation

## 2018-12-09 DIAGNOSIS — R030 Elevated blood-pressure reading, without diagnosis of hypertension: Secondary | ICD-10-CM | POA: Diagnosis not present

## 2018-12-09 LAB — POCT RAPID STREP A (OFFICE): Rapid Strep A Screen: NEGATIVE

## 2018-12-09 MED ORDER — KETOROLAC TROMETHAMINE 60 MG/2ML IM SOLN
60.0000 mg | Freq: Once | INTRAMUSCULAR | Status: AC
  Administered 2018-12-09: 60 mg via INTRAMUSCULAR

## 2018-12-09 NOTE — Telephone Encounter (Signed)
Called pt again but no answer and unable to leave a voicemail message.

## 2018-12-09 NOTE — Telephone Encounter (Signed)
Attempted to call pt to schedule testing, call was answered and then call was ended. Will attempt to call pt again.

## 2018-12-09 NOTE — Telephone Encounter (Signed)
Referred for Covid19 testing due to symptoms and recent travels to [place]. Ordered by [facility]. Appointment made for 6/12 at the [place] site at 8:30am. Informed to wear mask and stay in vehicle.

## 2018-12-09 NOTE — ED Triage Notes (Signed)
Pt visited new [place] last week , since return she hasnt felt well, sore throat and headache stared yesterday

## 2018-12-09 NOTE — Telephone Encounter (Signed)
-----   Message from Lestine Box, [place] sent at 12/09/2018  1:44 PM EDT ----- Regarding: COVID testing Pt with recent travel to [place].  Presents today with sore throat and fatigue. Negative rapid strep in office

## 2018-12-09 NOTE — Discharge Instructions (Signed)
Toradol shot given in office for headache Strep test negative, will send out for culture and we will call you with results  COVID testing ordered.  Outpatient center will contact you regarding your appointment In the meantime: You should remain isolated in your home for 7 days from symptom onset AND greater than 72 hours after symptoms resolution (absence of fever without the use of fever-reducing medication and improvement in respiratory symptoms), whichever is longer Get plenty of rest and push fluids You may use OTC zyrtec and/or flonase as needed for congestion and/ or runny nose Perform salt-water gargles, drink warm or cold liquids, and/or eat warm or cold foods to help soothe your throat Take OTC tylenol as needed for fever, body aches, and/or chills Call or go to the ED if you have any new or worsening symptoms such as fever, worsening cough, shortness of breath, chest tightness, chest pain, turning blue, changes in mental status, etc...  Blood pressure elevated in office.  Please recheck in 24 hours.  If it continues to be greater than 140/90 please follow up with PCP for further evaluation and management.  Information for local PCPs in the area attached.

## 2018-12-09 NOTE — ED Provider Notes (Signed)
[facility]   [id number] 12/09/18 Arrival Time: 3810  FB:[facility] THROAT and fatigue  SUBJECTIVE: History from: patient.  Joyce [name] is a 23 y.o. female hx significant for migraine, and renal dysfunction, who presents with sore throat and HA x 1 day. Denies sick exposure to COVID, flu or strep.  Admits to recent travel to [place] last week.  Has tried OTC excerdin with minimal relief.  Symptoms are made worse with swallowing, but tolerating liquids and own secretions without difficulty.  Reports previous symptoms in the past related to chronic migraines. Complains of subjective fever, and fatigue.  Denies chills, ear pain, sinus pain, rhinorrhea, nasal congestion, cough, SOB, wheezing, chest pain, chest pressure, nausea, vomiting, rash, changes in bowel or bladder habits.    ROS: As per HPI.  Past Medical History:  Diagnosis Date  . Migraine   . Renal dysfunction   . Severe preeclampsia   . Short interval between pregnancies complicating pregnancy in third trimester, antepartum    Past Surgical History:  Procedure Laterality Date  . THYROID SURGERY     No Known Allergies No current facility-administered medications on file prior to encounter.    Current Outpatient Medications on File Prior to Encounter  Medication Sig Dispense Refill  . erythromycin ophthalmic ointment Place a 1/2 inch ribbon of ointment to the upper eyelid. 3.5 g 0  . medroxyPROGESTERone Acetate 150 MG/ML SUSY Inject 1 mL (150 mg total) into the muscle once for 1 dose. 1 Syringe 3   Social History   Socioeconomic History  . Marital status: Single    Spouse name: Not on file  . Number of children: Not on file  . Years of education: Not on file  . Highest education level: Not on file  Occupational History  . Not on file  Social Needs  . Financial resource strain: Not on file  . Food insecurity    Worry: Not on file    Inability: Not on file  . Transportation needs    Medical: Not on file   Non-medical: Not on file  Tobacco Use  . Smoking status: Never Smoker  . Smokeless tobacco: Never Used  Substance and Sexual Activity  . Alcohol use: No  . Drug use: No  . Sexual activity: Yes    Birth control/protection: Injection  Lifestyle  . Physical activity    Days per week: Not on file    Minutes per session: Not on file  . Stress: Not on file  Relationships  . Social Herbalist on phone: Not on file    Gets together: Not on file    Attends religious service: Not on file    Active member of club or organization: Not on file    Attends meetings of clubs or organizations: Not on file    Relationship status: Not on file  . Intimate partner violence    Fear of current or ex partner: Not on file    Emotionally abused: Not on file    Physically abused: Not on file    Forced sexual activity: Not on file  Other Topics Concern  . Not on file  Social History Narrative  . Not on file   History reviewed. No pertinent family history.  OBJECTIVE:  Vitals:   12/09/18 1326  BP: (!) 145/106  Pulse: 71  Resp: 18  Temp: 98.8 F (37.1 C)  TempSrc: Oral  SpO2: 96%     General appearance: alert; appears mildly fatigued, but  nontoxic, speaking in full sentences and managing own secretions HEENT: NCAT; Ears: EACs clear, TMs pearly gray with visible cone of light, without erythema; Eyes: PERRL, EOMI grossly; Nose: no obvious rhinorrhea; Throat: oropharynx clear, tonsils not enlarged, mildly erythematous without white tonsillar exudates, uvula midline Neck: supple without LAD Lungs: CTA bilaterally without adventitious breath sounds; cough absent Heart: regular rate and rhythm.  Radial pulses 2+ symmetrical bilaterally Skin: warm and dry Psychological: alert and cooperative; normal mood and affect  LABS: Results for orders placed or performed during the hospital encounter of 12/09/18 (from the past 24 hour(s))  POCT rapid strep A     Status: None   Collection Time:  12/09/18  1:43 PM  Result Value Ref Range   Rapid Strep A Screen Negative Negative     ASSESSMENT & PLAN:  1. Acute pharyngitis, unspecified etiology   2. Elevated blood pressure reading   3. Suspected Covid-19 Virus Infection     Meds ordered this encounter  Medications  . ketorolac (TORADOL) injection 60 mg    Toradol shot given in office for headache Strep test negative, will send out for culture and we will call you with results  COVID testing ordered.  Outpatient center will contact you regarding your appointment In the meantime: You should remain isolated in your home for 7 days from symptom onset AND greater than 72 hours after symptoms resolution (absence of fever without the use of fever-reducing medication and improvement in respiratory symptoms), whichever is longer Get plenty of rest and push fluids You may use OTC zyrtec and/or flonase as needed for congestion and/ or runny nose Perform salt-water gargles, drink warm or cold liquids, and/or eat warm or cold foods to help soothe your throat Take OTC tylenol as needed for fever, body aches, and/or chills Call or go to the ED if you have any new or worsening symptoms such as fever, worsening cough, shortness of breath, chest tightness, chest pain, turning blue, changes in mental status, etc...  Blood pressure elevated in office.  Please recheck in 24 hours.  If it continues to be greater than 140/90 please follow up with PCP for further evaluation and management.  Information for local PCPs in the area attached.    Reviewed expectations re: course of current medical issues. Questions answered. Outlined signs and symptoms indicating need for more acute intervention. Patient verbalized understanding. After Visit Summary given.        Rennis HardingWurst, [place], PA-C 12/09/18 1356

## 2018-12-10 ENCOUNTER — Other Ambulatory Visit: Payer: Medicaid Other

## 2018-12-10 DIAGNOSIS — Z20822 Contact with and (suspected) exposure to covid-19: Secondary | ICD-10-CM

## 2018-12-10 DIAGNOSIS — R6889 Other general symptoms and signs: Secondary | ICD-10-CM | POA: Diagnosis not present

## 2018-12-12 LAB — CULTURE, GROUP A STREP (THRC)

## 2018-12-14 LAB — NOVEL CORONAVIRUS, NAA: SARS-CoV-2, NAA: NOT DETECTED

## 2019-01-05 ENCOUNTER — Ambulatory Visit (INDEPENDENT_AMBULATORY_CARE_PROVIDER_SITE_OTHER): Payer: Medicaid Other | Admitting: Family Medicine

## 2019-01-05 ENCOUNTER — Other Ambulatory Visit: Payer: Self-pay

## 2019-01-05 ENCOUNTER — Encounter: Payer: Self-pay | Admitting: Family Medicine

## 2019-01-05 VITALS — BP 140/90 | HR 77 | Temp 98.8°F | Ht 68.5 in | Wt 136.0 lb

## 2019-01-05 DIAGNOSIS — I10 Essential (primary) hypertension: Secondary | ICD-10-CM

## 2019-01-05 DIAGNOSIS — Z9889 Other specified postprocedural states: Secondary | ICD-10-CM

## 2019-01-05 DIAGNOSIS — F419 Anxiety disorder, unspecified: Secondary | ICD-10-CM

## 2019-01-05 MED ORDER — AMLODIPINE BESYLATE 2.5 MG PO TABS: 2.5000 mg | ORAL_TABLET | Freq: Every day | ORAL | 1 refills | Status: DC

## 2019-01-05 MED ORDER — AMLODIPINE BESYLATE 5 MG PO TABS: 5.0000 mg | ORAL_TABLET | Freq: Every day | ORAL | 3 refills | Status: DC

## 2019-01-05 MED ORDER — BUSPIRONE HCL 5 MG PO TABS: 5.0000 mg | ORAL_TABLET | Freq: Two times a day (BID) | ORAL | 1 refills | Status: DC

## 2019-01-05 NOTE — Patient Instructions (Signed)
Thank you for coming into the office today. I appreciate the opportunity to provide you with the care for your health and wellness. Today we discussed: blood pressure and diet  Follow Up: 2 weeks  And please go ahead set up annual visit without pap  Labs today  Attached are diets you review  Please continue to practice social distancing to keep you, your family, and our community safe.  If you must go out, please wear a Mask and practice good handwashing.  WASH YOUR HANDS WELL AND FREQUENTLY. AVOID TOUCHING YOUR FACE, UNLESS YOUR HANDS ARE FRESHLY WASHED.  GET FRESH AIR DAILY. STAY HYDRATED WITH WATER.   It was a pleasure to see you and I look forward to continuing to work together on your health and well-being. Please do not hesitate to call the office if you need care or have questions about your care.  Have a wonderful day and week.  With Gratitude,  Tereasa Coop, DNP, AGNP-BC   DASH Eating Plan DASH stands for "Dietary Approaches to Stop Hypertension." The DASH eating plan is a healthy eating plan that has been shown to reduce high blood pressure (hypertension). It may also reduce your risk for type 2 diabetes, heart disease, and stroke. The DASH eating plan may also help with weight loss. What are tips for following this plan?  General guidelines  Avoid eating more than 2,300 mg (milligrams) of salt (sodium) a day. If you have hypertension, you may need to reduce your sodium intake to 1,500 mg a day.  Limit alcohol intake to no more than 1 drink a day for nonpregnant women and 2 drinks a day for men. One drink equals 12 oz of beer, 5 oz of wine, or 1 oz of hard liquor.  Work with your health care provider to maintain a healthy body weight or to lose weight. Ask what an ideal weight is for you.  Get at least 30 minutes of exercise that causes your heart to beat faster (aerobic exercise) most days of the week. Activities may include walking, swimming, or biking.   Work with your health care provider or diet and nutrition specialist (dietitian) to adjust your eating plan to your individual calorie needs. Reading food labels   Check food labels for the amount of sodium per serving. Choose foods with less than 5 percent of the Daily Value of sodium. Generally, foods with less than 300 mg of sodium per serving fit into this eating plan.  To find whole grains, look for the word "whole" as the first word in the ingredient list. Shopping  Buy products labeled as "low-sodium" or "no salt added."  Buy fresh foods. Avoid canned foods and premade or frozen meals. Cooking  Avoid adding salt when cooking. Use salt-free seasonings or herbs instead of table salt or sea salt. Check with your health care provider or pharmacist before using salt substitutes.  Do not fry foods. Cook foods using healthy methods such as baking, boiling, grilling, and broiling instead.  Cook with heart-healthy oils, such as olive, canola, soybean, or sunflower oil. Meal planning  Eat a balanced diet that includes: ? 5 or more servings of fruits and vegetables each day. At each meal, try to fill half of your plate with fruits and vegetables. ? Up to 6-8 servings of whole grains each day. ? Less than 6 oz of lean meat, poultry, or fish each day. A 3-oz serving of meat is about the same size as a deck of  cards. One egg equals 1 oz. ? 2 servings of low-fat dairy each day. ? A serving of nuts, seeds, or beans 5 times each week. ? Heart-healthy fats. Healthy fats called Omega-3 fatty acids are found in foods such as flaxseeds and coldwater fish, like sardines, salmon, and mackerel.  Limit how much you eat of the following: ? Canned or prepackaged foods. ? Food that is high in trans fat, such as fried foods. ? Food that is high in saturated fat, such as fatty meat. ? Sweets, desserts, sugary drinks, and other foods with added sugar. ? Full-fat dairy products.  Do not salt foods before  eating.  Try to eat at least 2 vegetarian meals each week.  Eat more home-cooked food and less restaurant, buffet, and fast food.  When eating at a restaurant, ask that your food be prepared with less salt or no salt, if possible. What foods are recommended? The items listed may not be a complete list. Talk with your dietitian about what dietary choices are best for you. Grains Whole-grain or whole-wheat bread. Whole-grain or whole-wheat pasta. Brown rice. Orpah Cobbatmeal. Quinoa. Bulgur. Whole-grain and low-sodium cereals. Pita bread. Low-fat, low-sodium crackers. Whole-wheat flour tortillas. Vegetables Fresh or frozen vegetables (raw, steamed, roasted, or grilled). Low-sodium or reduced-sodium tomato and vegetable juice. Low-sodium or reduced-sodium tomato sauce and tomato paste. Low-sodium or reduced-sodium canned vegetables. Fruits All fresh, dried, or frozen fruit. Canned fruit in natural juice (without added sugar). Meat and other protein foods Skinless chicken or [place]. Ground chicken or [place]. Pork with fat trimmed off. Fish and seafood. Egg whites. Dried beans, peas, or lentils. Unsalted nuts, nut butters, and seeds. Unsalted canned beans. Lean cuts of beef with fat trimmed off. Low-sodium, lean deli meat. Dairy Low-fat (1%) or fat-free (skim) milk. Fat-free, low-fat, or reduced-fat cheeses. Nonfat, low-sodium ricotta or cottage cheese. Low-fat or nonfat yogurt. Low-fat, low-sodium cheese. Fats and oils Soft margarine without trans fats. Vegetable oil. Low-fat, reduced-fat, or light mayonnaise and salad dressings (reduced-sodium). Canola, safflower, olive, soybean, and sunflower oils. Avocado. Seasoning and other foods Herbs. Spices. Seasoning mixes without salt. Unsalted popcorn and pretzels. Fat-free sweets. What foods are not recommended? The items listed may not be a complete list. Talk with your dietitian about what dietary choices are best for you. Grains Baked goods made with fat,  such as croissants, muffins, or some breads. Dry pasta or rice meal packs. Vegetables Creamed or fried vegetables. Vegetables in a cheese sauce. Regular canned vegetables (not low-sodium or reduced-sodium). Regular canned tomato sauce and paste (not low-sodium or reduced-sodium). Regular tomato and vegetable juice (not low-sodium or reduced-sodium). Rosita FirePickles. Olives. Fruits Canned fruit in a light or heavy syrup. Fried fruit. Fruit in cream or butter sauce. Meat and other protein foods Fatty cuts of meat. Ribs. Fried meat. Tomasa BlaseBacon. Sausage. Bologna and other processed lunch meats. Salami. Fatback. Hotdogs. Bratwurst. Salted nuts and seeds. Canned beans with added salt. Canned or smoked fish. Whole eggs or egg yolks. Chicken or [place] with skin. Dairy Whole or 2% milk, cream, and half-and-half. Whole or full-fat cream cheese. Whole-fat or sweetened yogurt. Full-fat cheese. Nondairy creamers. Whipped toppings. Processed cheese and cheese spreads. Fats and oils Butter. Stick margarine. Lard. Shortening. Ghee. Bacon fat. Tropical oils, such as coconut, palm kernel, or palm oil. Seasoning and other foods Salted popcorn and pretzels. Onion salt, garlic salt, seasoned salt, table salt, and sea salt. Worcestershire sauce. Tartar sauce. Barbecue sauce. Teriyaki sauce. Soy sauce, including reduced-sodium. Steak sauce. Canned and packaged  gravies. Fish sauce. Oyster sauce. Cocktail sauce. Horseradish that you find on the shelf. Ketchup. Mustard. Meat flavorings and tenderizers. Bouillon cubes. Hot sauce and Tabasco sauce. Premade or packaged marinades. Premade or packaged taco seasonings. Relishes. Regular salad dressings. Where to find more information:  National Heart, Lung, and Blood Institute: PopSteam.iswww.nhlbi.nih.gov  American Heart Association: www.heart.org Summary  The DASH eating plan is a healthy eating plan that has been shown to reduce high blood pressure (hypertension). It may also reduce your risk for  type 2 diabetes, heart disease, and stroke.  With the DASH eating plan, you should limit salt (sodium) intake to 2,300 mg a day. If you have hypertension, you may need to reduce your sodium intake to 1,500 mg a day.  When on the DASH eating plan, aim to eat more fresh fruits and vegetables, whole grains, lean proteins, low-fat dairy, and heart-healthy fats.  Work with your health care provider or diet and nutrition specialist (dietitian) to adjust your eating plan to your individual calorie needs. This information is not intended to replace advice given to you by your health care provider. Make sure you discuss any questions you have with your health care provider. Document Released: 06/05/2011 Document Revised: 05/29/2017 Document Reviewed: 06/09/2016 Elsevier Patient Education  2020 [employer]   Heart-Healthy Eating Plan Heart-healthy meal planning includes:  Eating less unhealthy fats.  Eating more healthy fats.  Making other changes in your diet. Talk with your doctor or a diet specialist (dietitian) to create an eating plan that is right for you. What are tips for following this plan? Cooking Avoid frying your food. Try to bake, boil, grill, or broil it instead. You can also reduce fat by:  Removing the skin from poultry.  Removing all visible fats from meats.  Steaming vegetables in water or broth. Meal planning   At meals, divide your plate into four equal parts: ? Fill one-half of your plate with vegetables and green salads. ? Fill one-fourth of your plate with whole grains. ? Fill one-fourth of your plate with lean protein foods.  Eat 4-5 servings of vegetables per day. A serving of vegetables is: ? 1 cup of raw or cooked vegetables. ? 2 cups of raw leafy greens.  Eat 4-5 servings of fruit per day. A serving of fruit is: ? 1 medium whole fruit. ?  cup of dried fruit. ?  cup of fresh, frozen, or canned fruit. ?  cup of 100% fruit juice.  Eat more foods  that have soluble fiber. These are apples, broccoli, carrots, beans, peas, and barley. Try to get 20-30 g of fiber per day.  Eat 4-5 servings of nuts, legumes, and seeds per week: ? 1 serving of dried beans or legumes equals  cup after being cooked. ? 1 serving of nuts is  cup. ? 1 serving of seeds equals 1 tablespoon. General information  Eat more home-cooked food. Eat less restaurant, buffet, and fast food.  Limit or avoid alcohol.  Limit foods that are high in starch and sugar.  Avoid fried foods.  Lose weight if you are overweight.  Keep track of how much salt (sodium) you eat. This is important if you have high blood pressure. Ask your doctor to tell you more about this.  Try to add vegetarian meals each week. Fats  Choose healthy fats. These include olive oil and canola oil, flaxseeds, walnuts, almonds, and seeds.  Eat more omega-3 fats. These include salmon, mackerel, sardines, tuna, flaxseed oil, and ground flaxseeds.  Try to eat fish at least 2 times each week.  Check food labels. Avoid foods with trans fats or high amounts of saturated fat.  Limit saturated fats. ? These are often found in animal products, such as meats, butter, and cream. ? These are also found in plant foods, such as palm oil, palm kernel oil, and coconut oil.  Avoid foods with partially hydrogenated oils in them. These have trans fats. Examples are stick margarine, some tub margarines, cookies, crackers, and other baked goods. What foods can I eat? Fruits All fresh, canned (in natural juice), or frozen fruits. Vegetables Fresh or frozen vegetables (raw, steamed, roasted, or grilled). Green salads. Grains Most grains. Choose whole wheat and whole grains most of the time. Rice and pasta, including brown rice and pastas made with whole wheat. Meats and other proteins Lean, well-trimmed beef, veal, pork, and lamb. Chicken and [place] without skin. All fish and shellfish. Wild duck, rabbit, pheasant,  and venison. Egg whites or low-cholesterol egg substitutes. Dried beans, peas, lentils, and tofu. Seeds and most nuts. Dairy Low-fat or nonfat cheeses, including ricotta and mozzarella. Skim or 1% milk that is liquid, powdered, or evaporated. Buttermilk that is made with low-fat milk. Nonfat or low-fat yogurt. Fats and oils Non-hydrogenated (trans-free) margarines. Vegetable oils, including soybean, sesame, sunflower, olive, peanut, safflower, corn, canola, and cottonseed. Salad dressings or mayonnaise made with a vegetable oil. Beverages Mineral water. Coffee and tea. Diet carbonated beverages. Sweets and desserts Sherbet, gelatin, and fruit ice. Small amounts of dark chocolate. Limit all sweets and desserts. Seasonings and condiments All seasonings and condiments. The items listed above may not be a complete list of foods and drinks you can eat. Contact a dietitian for more options. What foods should I avoid? Fruits Canned fruit in heavy syrup. Fruit in cream or butter sauce. Fried fruit. Limit coconut. Vegetables Vegetables cooked in cheese, cream, or butter sauce. Fried vegetables. Grains Breads that are made with saturated or trans fats, oils, or whole milk. Croissants. Sweet rolls. Donuts. High-fat crackers, such as cheese crackers. Meats and other proteins Fatty meats, such as hot dogs, ribs, sausage, bacon, rib-eye roast or steak. High-fat deli meats, such as salami and bologna. Caviar. Domestic duck and goose. Organ meats, such as liver. Dairy Cream, sour cream, cream cheese, and creamed cottage cheese. Whole-milk cheeses. Whole or 2% milk that is liquid, evaporated, or condensed. Whole buttermilk. Cream sauce or high-fat cheese sauce. Yogurt that is made from whole milk. Fats and oils Meat fat, or shortening. Cocoa butter, hydrogenated oils, palm oil, coconut oil, palm kernel oil. Solid fats and shortenings, including bacon fat, salt pork, lard, and butter. Nondairy cream  substitutes. Salad dressings with cheese or sour cream. Beverages Regular sodas and juice drinks with added sugar. Sweets and desserts Frosting. Pudding. Cookies. Cakes. Pies. Milk chocolate or white chocolate. Buttered syrups. Full-fat ice cream or ice cream drinks. The items listed above may not be a complete list of foods and drinks to avoid. Contact a dietitian for more information. Summary  Heart-healthy meal planning includes eating less unhealthy fats, eating more healthy fats, and making other changes in your diet.  Eat a balanced diet. This includes fruits and vegetables, low-fat or nonfat dairy, lean protein, nuts and legumes, whole grains, and heart-healthy oils and fats. This information is not intended to replace advice given to you by your health care provider. Make sure you discuss any questions you have with your health care provider. Document Released: 12/16/2011 Document Revised:  08/20/2017 Document Reviewed: 07/24/2017 Elsevier Patient Education  2020 [employer]

## 2019-01-05 NOTE — Progress Notes (Signed)
Subjective:     Patient ID: Joyce [name], female   DOB: [birth year], 23 y.o.   MRN: [id number]  Joyce GivensJasmine M [name] presents for New Patient (Initial Visit), Hypertension, and Headache  [name] is a 23 year old female patient who presents today establish care.  Of concern she has been having elevated blood pressure and headaches.  Reports both of these run in her family as well.  Additional history includes thyroid disease has had right side removed, she is unsure what could have been the left side.  Substance abuse is a marijuana smoker reports this is not regular, maybe once or twice a week.  Short interval pregnancies with one ending in preeclampsia where she developed blood pressure issues and she is never been able to get rid of them.  Migraines, heart murmur when she was younger, anxiety, allergies.  Elevated blood pressure: Was diagnosed with high blood pressure when she was preeclamptic with her children.  But she was never placed on anything as they said that it should dissipate after she had had her children.  But it never did.  She reports that she has had increasing headaches.  Denies vision changes or leg swelling.  But does endorse having some chest tightness and heavy feeling.  But she reports that this is when her blood pressure is high.  She reports that 1 of her aunts have also has high blood pressure as well as her mother but she took a blood pressure pill from her aunt and reported that that made her feel little bit better.  And that is when she figured that she needed to come to the doctor to get assessed better because she does not want to have the bad side effects of having high blood pressure.  Additionally she is on Depo-Medrol.  This can increase headaches as well.  Reports that she has been gaining some weight on this medication but her BMI is in a good range.  Denies smoking cigarettes.  Reports use of alcohol occasionally.  Reports using marijuana 1-2 times per week.   Reports sexually active uses Depo-Medrol injections as birth control.  Lives with her- 2 children who are 4 and 5.  Both fathers are in the lives of her children. Her youngest who is her son spends half of his time in [place] with his father. She has her oldest, her daughter who lives with her full-time.  She does receive child support for him.  She Reports good coparenting with them all.  In her spare time she enjoys playing with her children and going out to eat going to movies when it was allowed and listening to music.  She reports that she does not eat as good as she should.  She tries to make sure her children eat well.  But she herself does not do as much she does not like to eat a lot of fruits and veggies but she does drink V8 to help get these in her diet.  She reports she does eat meat as well.  Occasional fast food.  Reports that she drinks Pepsi 4 to 5 cans a day.  She is already slow down on this was drinking more.  She reports she also tries to drink about a gallon of water daily.  She knows this is important for her.  She endorses wearing her seatbelt does not wear sunscreen.  Reports that she has smoke and carbon monoxide detectors in her apartment building.  Reports that she  does not use her phone while she is driving unless she is on the phone call.  Health maintenance wise she is followed for women's health OB/GYN in [place].  She would like to continue to go on there for her health women's health care and [facility]. All screenings and and vaccinations appear to be up-to-date though she does not have tetanus marked in the [facility] health chart asked her to ask her OB if they have administered it previously.  She is probably coming out of the 10-year.  When she received it when she was a teenager.  The pediatrician's office.  But she does not remember.  Today patient denies signs and symptoms of COVID 19 infection including fever, chills, cough, shortness of breath, and headache.  Past  Medical, Surgical, Social History, Allergies, and Medications have been Reviewed.   Past Medical History:  Diagnosis Date  . Allergy    seasonal  . Anxiety   . Heart murmur    was told that she had beginning stages  . Migraine   . Renal dysfunction   . Severe preeclampsia   . Short interval between pregnancies complicating pregnancy in third trimester, antepartum   . Substance abuse ([place])    uses marijuanna  . Thyroid disease    right side removed   Past Surgical History:  Procedure Laterality Date  . THYROID SURGERY     Social History   Socioeconomic History  . Marital status: Single    Spouse name: Not on file  . Number of children: 2  . Years of education: Not on file  . Highest education level: High school graduate  Occupational History  . Not on file  Social Needs  . Financial resource strain: Not hard at all  . Food insecurity    Worry: Never true    Inability: Never true  . Transportation needs    Medical: No    Non-medical: No  Tobacco Use  . Smoking status: Never Smoker  . Smokeless tobacco: Never Used  Substance and Sexual Activity  . Alcohol use: Yes    Comment: occasion  . Drug use: Yes    Frequency: 1.0 times per week    Types: Marijuana  . Sexual activity: Yes    Birth control/protection: Injection  Lifestyle  . Physical activity    Days per week: 2 days    Minutes per session: 30 min  . Stress: Very much  Relationships  . Social connections    Talks on phone: More than three times a week    Gets together: Once a week    Attends religious service: Never    Active member of club or organization: No    Attends meetings of clubs or organizations: Never    Relationship status: Never married  . Intimate partner violence    Fear of current or ex partner: No    Emotionally abused: No    Physically abused: No    Forced sexual activity: No  Other Topics Concern  . Not on file  Social History Narrative   Lives with two kids   Joyce [name]: 5  (full time)    Joyce [name]: 4 (half and half) [place]    Fathers are in the lives, help take care of children      Enjoys: playing with kids, going out to eat, movies and music      Diet: Does not feel like she eats as good as she should. Does eat veggies and fruits; will drink v8  Caffeine: pepis -4-5 cans a day, has slowed down    Water: gallon daily      Wear seat belt   Does not wear sunscreen   Smoke and carbon monoxide detectors   Does not use phone while driving, unless it is a call.     Outpatient Encounter Medications as of 01/05/2019  Medication Sig  . amLODipine (NORVASC) 2.5 MG tablet Take 1 tablet (2.5 mg total) by mouth daily.  . medroxyPROGESTERone Acetate 150 MG/ML SUSY Inject 1 mL (150 mg total) into the muscle once for 1 dose.  . [DISCONTINUED] amLODipine (NORVASC) 5 MG tablet Take 1 tablet (5 mg total) by mouth daily.  . [DISCONTINUED] busPIRone (BUSPAR) 5 MG tablet Take 1 tablet (5 mg total) by mouth 2 (two) times daily.  . [DISCONTINUED] erythromycin ophthalmic ointment Place a 1/2 inch ribbon of ointment to the upper eyelid. (Patient not taking: Reported on 01/05/2019)   No facility-administered encounter medications on file as of 01/05/2019.    No Known Allergies  Review of Systems  Constitutional: Negative for chills and fever.  HENT: Negative.   Eyes: Negative for visual disturbance.  Respiratory: Positive for chest tightness. Negative for cough and shortness of breath.   Cardiovascular: Negative for palpitations and leg swelling.  Gastrointestinal: Negative.   Endocrine: Negative.   Genitourinary: Negative.   Musculoskeletal: Negative.   Skin: Negative.   Allergic/Immunologic: Negative.   Neurological: Positive for dizziness and headaches.  Hematological: Negative.   Psychiatric/Behavioral: The patient is nervous/anxious.   All other systems reviewed and are negative.      Objective:     BP 140/90   Pulse 77   Temp 98.8 F (37.1 C) (Oral)   Ht 5' 8.5"  (1.74 m)   Wt 136 lb (61.7 kg)   SpO2 97%   BMI 20.38 kg/m   Physical Exam Vitals signs and nursing note reviewed.  Constitutional:      Appearance: Normal appearance. She is well-developed and normal weight.  HENT:     Head: Normocephalic.     Right Ear: External ear normal.     Left Ear: External ear normal.     Nose: Nose normal.  Eyes:     Conjunctiva/sclera: Conjunctivae normal.  Neck:     Musculoskeletal: Normal range of motion.  Cardiovascular:     Rate and Rhythm: Normal rate and regular rhythm.     Pulses: Normal pulses.     Heart sounds: Normal heart sounds.  Pulmonary:     Effort: Pulmonary effort is normal.     Breath sounds: Normal breath sounds.  Musculoskeletal: Normal range of motion.  Skin:    General: Skin is warm.  Neurological:     Mental Status: She is alert and oriented to person, place, and time.  Psychiatric:        Mood and Affect: Mood is anxious.        Speech: Speech normal.        Behavior: Behavior normal.        Thought Content: Thought content normal.        Judgment: Judgment normal.        Assessment and Plan        1. Essential hypertension Elevation in BP today in office. Has been elevated for years it seems since pregnancy.  Will start low dose norvasc and see back in 2 weeks   - COMPLETE METABOLIC PANEL WITH GFR - CBC - amLODipine (NORVASC) 5 MG tablet; Take 1  tablet (5 mg total) by mouth daily.  Dispense: 90 tablet; Refill: 3  2. S/P thyroid surgery BMI is low, having hard time gaining weight. Anxiety high, not sleeping well. Will check TSH level  - TSH  3. Anxiety Uncontrolled, could be thyroid related, contraceptive related or BP related as well. Will prescribe buspar for short term use.   - busPIRone (BUSPAR) 5 MG tablet; Take 1 tablet (5 mg total) by mouth 2 (two) times daily.  Dispense: 60 tablet; Refill: 1    Freddy FinnerHannah M. Shanon Becvar, DNP, AGNP-BC [facility] [address], [address] [place], [place] [address] Office Hours: Mon-Thurs 8 am-5 pm; Fri 8 am-12 pm Office Phone:  [phone]  Office Fax: [phone]

## 2019-01-06 LAB — COMPLETE METABOLIC PANEL WITH GFR
AG Ratio: 2.3 (calc) (ref 1.0–2.5)
ALT: 12 U/L (ref 6–29)
AST: 20 U/L (ref 10–30)
Albumin: 4.8 g/dL (ref 3.6–5.1)
Alkaline phosphatase (APISO): 74 U/L (ref 31–125)
BUN/Creatinine Ratio: 13 (calc) (ref 6–22)
BUN: 18 mg/dL (ref 7–25)
CO2: 27 mmol/L (ref 20–32)
Calcium: 9.9 mg/dL (ref 8.6–10.2)
Chloride: 104 mmol/L (ref 98–110)
Creat: 1.4 mg/dL — ABNORMAL HIGH (ref 0.50–1.10)
GFR, Est African American: 61 mL/min/{1.73_m2} (ref >=60)
GFR, Est Non African American: 53 mL/min/{1.73_m2} — ABNORMAL LOW (ref >=60)
Globulin: 2.1 g/dL (calc) (ref 1.9–3.7)
Glucose, Bld: 87 mg/dL (ref 65–139)
Potassium: 4.5 mmol/L (ref 3.5–5.3)
Sodium: 138 mmol/L (ref 135–146)
Total Bilirubin: 0.7 mg/dL (ref 0.2–1.2)
Total Protein: 6.9 g/dL (ref 6.1–8.1)

## 2019-01-06 LAB — TSH: TSH: 1.25 mIU/L

## 2019-01-19 ENCOUNTER — Ambulatory Visit (INDEPENDENT_AMBULATORY_CARE_PROVIDER_SITE_OTHER): Payer: Medicaid Other | Admitting: Family Medicine

## 2019-01-19 ENCOUNTER — Other Ambulatory Visit: Payer: Self-pay

## 2019-01-19 ENCOUNTER — Encounter: Payer: Self-pay | Admitting: Family Medicine

## 2019-01-19 ENCOUNTER — Encounter (INDEPENDENT_AMBULATORY_CARE_PROVIDER_SITE_OTHER): Payer: Self-pay

## 2019-01-19 VITALS — BP 130/80 | HR 64 | Temp 99.1°F | Resp 12 | Ht 68.0 in | Wt 130.0 lb

## 2019-01-19 DIAGNOSIS — F419 Anxiety disorder, unspecified: Secondary | ICD-10-CM

## 2019-01-19 DIAGNOSIS — R636 Underweight: Secondary | ICD-10-CM

## 2019-01-19 DIAGNOSIS — F32A Depression, unspecified: Secondary | ICD-10-CM

## 2019-01-19 DIAGNOSIS — F329 Major depressive disorder, single episode, unspecified: Secondary | ICD-10-CM | POA: Diagnosis not present

## 2019-01-19 MED ORDER — FLUOXETINE HCL 10 MG PO CAPS: 10.0000 mg | ORAL_CAPSULE | Freq: Every day | ORAL | 3 refills | Status: DC

## 2019-01-19 MED ORDER — BUSPIRONE HCL 10 MG PO TABS: 10.0000 mg | ORAL_TABLET | Freq: Two times a day (BID) | ORAL | 1 refills | Status: DC

## 2019-01-19 NOTE — Progress Notes (Signed)
Subjective:     Patient ID: Joyce [name], female   DOB: 1995-08-08, 23 y.o.   MRN: [id number]  SHIRLENE ANDAYA presents for Hypertension (2 week follow up and lab results) and Anxiety (med isn't working)  Here today for blood pressure follow up. BP is better in the office. Reports taking medication without issue. Reports less headaches. Denies chest pain, vision changes, leg swelling or other HTN S&S.  Anxiety: Feels it is out of control. Tearful, unable to eat. Constantly worried and feeling upset. Has family in the house right now, her sons family. She feels judged and is struggling to keep herself together.  The family is suppose to leave today or tomorrow. She hopes she will feel better after this.  But she has lost 6 pounds since her last visit on 01/05/2019. This is concerning. She is willing to speak to [facility] virtually today.   Today patient denies signs and symptoms of COVID 19 infection including fever, chills, cough, shortness of breath, and headache.  Past Medical, Surgical, Social History, Allergies, and Medications have been Reviewed.   Past Medical History:  Diagnosis Date  . Allergy    seasonal  . Anxiety   . Heart murmur    was told that she had beginning stages  . Migraine   . Renal dysfunction   . Severe preeclampsia   . Short interval between pregnancies complicating pregnancy in third trimester, antepartum   . Substance abuse ([place])    uses marijuanna  . Thyroid disease    right side removed   Past Surgical History:  Procedure Laterality Date  . THYROID SURGERY     Social History   Socioeconomic History  . Marital status: Single    Spouse name: Not on file  . Number of children: 2  . Years of education: Not on file  . Highest education level: High school graduate  Occupational History  . Not on file  Social Needs  . Financial resource strain: Not hard at all  . Food insecurity    Worry: Never true    Inability: Never true  . Transportation  needs    Medical: No    Non-medical: No  Tobacco Use  . Smoking status: Never Smoker  . Smokeless tobacco: Never Used  Substance and Sexual Activity  . Alcohol use: Yes    Comment: occasion  . Drug use: Yes    Frequency: 1.0 times per week    Types: Marijuana  . Sexual activity: Yes    Birth control/protection: Injection  Lifestyle  . Physical activity    Days per week: 2 days    Minutes per session: 30 min  . Stress: Very much  Relationships  . Social connections    Talks on phone: More than three times a week    Gets together: Once a week    Attends religious service: Never    Active member of club or organization: No    Attends meetings of clubs or organizations: Never    Relationship status: Never married  . Intimate partner violence    Fear of current or ex partner: No    Emotionally abused: No    Physically abused: No    Forced sexual activity: No  Other Topics Concern  . Not on file  Social History Narrative   Lives with two kids   Auberi: 5 (full time)    Jaxon: 4 (half and half) [place]    Fathers are in the lives, help  take care of children      Enjoys: playing with kids, going out to eat, movies and music      Diet: Does not feel like she eats as good as she should. Does eat veggies and fruits; will drink v8   Caffeine: pepis -4-5 cans a day, has slowed down    Water: gallon daily      Wear seat belt   Does not wear sunscreen   Smoke and carbon monoxide detectors   Does not use phone while driving, unless it is a call.     Outpatient Encounter Medications as of 01/19/2019  Medication Sig  . amLODipine (NORVASC) 2.5 MG tablet Take 1 tablet (2.5 mg total) by mouth daily.  . busPIRone (BUSPAR) 5 MG tablet Take 5 mg by mouth 2 (two) times a day.  . medroxyPROGESTERone Acetate 150 MG/ML SUSY Inject 1 mL (150 mg total) into the muscle once for 1 dose.   No facility-administered encounter medications on file as of 01/19/2019.    No Known Allergies  Review of  Systems  Constitutional: Positive for appetite change and unexpected weight change. Negative for chills and fever.  HENT: Negative.   Eyes: Negative.   Respiratory: Negative.  Negative for cough and shortness of breath.   Cardiovascular: Negative.   Gastrointestinal: Negative.   Endocrine: Negative.   Genitourinary: Negative.   Musculoskeletal: Negative.   Skin: Negative.   Allergic/Immunologic: Negative.   Neurological: Negative.   Hematological: Negative.   Psychiatric/Behavioral: The patient is nervous/anxious.   All other systems reviewed and are negative.      Objective:     BP 130/80   Pulse 64   Temp 99.1 F (37.3 C) (Oral)   Resp 12   Ht 5\' 8"  (1.727 m)   Wt 130 lb (59 kg)   SpO2 94%   BMI 19.77 kg/m   Physical Exam Vitals signs and nursing note reviewed.  Constitutional:      Appearance: Normal appearance. She is normal weight.     Comments: BMI is lower than 2 weeks ago  HENT:     Head: Normocephalic and atraumatic.     Right Ear: External ear normal.     Left Ear: External ear normal.     Nose: Nose normal.  Eyes:     General:        Right eye: No discharge.        Left eye: No discharge.     Conjunctiva/sclera: Conjunctivae normal.  Neck:     Musculoskeletal: Normal range of motion and neck supple.  Cardiovascular:     Rate and Rhythm: Normal rate and regular rhythm.     Pulses: Normal pulses.     Heart sounds: Normal heart sounds.  Pulmonary:     Effort: Pulmonary effort is normal.     Breath sounds: Normal breath sounds.  Musculoskeletal: Normal range of motion.  Skin:    General: Skin is warm.  Neurological:     General: No focal deficit present.     Mental Status: She is alert and oriented to person, place, and time.  Psychiatric:        Attention and Perception: Attention normal.        Mood and Affect: Mood is anxious. Affect is tearful.        Speech: Speech normal.        Behavior: Behavior normal. Behavior is cooperative.         Thought Content: Thought content normal.  Cognition and Memory: Cognition and memory normal.        Judgment: Judgment normal.    GAD 7 : Generalized Anxiety Score 01/19/2019  Nervous, Anxious, on Edge 3  Control/stop worrying 3  Worry too much - different things 3  Trouble relaxing 3  Restless 3  Easily annoyed or irritable 3  Afraid - awful might happen 3  Total GAD 7 Score 21  Anxiety Difficulty Somewhat difficult   Depression screen [facility] 2/9 01/19/2019 01/05/2019  Decreased Interest 0 0  Down, Depressed, Hopeless 3 0  PHQ - 2 Score 3 0  Altered sleeping 1 -  Tired, decreased energy 1 -  Change in appetite 3 -  Feeling bad or failure about yourself  0 -  Trouble concentrating 0 -  Moving slowly or fidgety/restless 0 -  PHQ-9 Score 8 -  Difficult doing work/chores Extremely dIfficult -        Assessment and Plan        1. Underweight She has lost more weight. Anxiety driven I suspect.  [facility] the [facility].  Will start Prozac to see if this helps as well. If can get anxiety under control weight might increase. Encouraged ensure during this time. TSH normal  - FLUoxetine (PROZAC) 10 MG capsule; Take 1 capsule (10 mg total) by mouth daily.  Dispense: 30 capsule; Refill: 3  2. Anxiety Uncontrolled, TSH normal.  Increased Buspar and started prozac. As well [facility] GAD7 was 21 in office today  Reviewed side effects, risks and benefits of medication.   Patient acknowledged agreement and understanding of the plan.   - FLUoxetine (PROZAC) 10 MG capsule; Take 1 capsule (10 mg total) by mouth daily.  Dispense: 30 capsule; Refill: 3 - busPIRone (BUSPAR) 10 MG tablet; Take 1 tablet (10 mg total) by mouth 2 (two) times a day.  Dispense: 60 tablet; Refill: 1  3. Depression, unspecified depression type PHQ increased from 0 to 8 over last 2 weeks. Could be situational. Starting Prozac and [facility]   Follow Up: 4 weeks  Freddy FinnerHannah M. Layli Capshaw,  DNP, AGNP-BC [facility] [address], [address] [place], [place] [address] Office Hours: Mon-Thurs 8 am-5 pm; Fri 8 am-12 pm Office Phone:  [phone]  Office Fax: [phone]

## 2019-01-19 NOTE — Patient Instructions (Addendum)
    Thank you for coming into the office today. I appreciate the opportunity to provide you with the care for your health and wellness. Today we discussed: anxiety and weight check  Follow up: 4 weeks   Work note for today and tomorrow, return Friday 7/24  No labs today.  Referral for Anxiety made  Change in medications: Buspar increased to 10 mg (please take one every morning, and take one in the evening if you need it). Please start Prozac 10 mg daily. You might get headaches and notice an increase in anxiety at the start of this medication, but these will subside with continued use.  Please continue to practice social distancing to keep you, your family, and our community safe.  If you must go out, please wear a Mask and practice good handwashing.  [place] YOUR HANDS WELL AND FREQUENTLY. AVOID TOUCHING YOUR FACE, UNLESS YOUR HANDS ARE FRESHLY WASHED.  GET FRESH AIR DAILY. STAY HYDRATED WITH WATER.   It was a pleasure to see you and I look forward to continuing to work together on your health and well-being. Please do not hesitate to call the office if you need care or have questions about your care.  Have a wonderful day and week.  With Gratitude,  Cherly Beach, DNP, AGNP-BC

## 2019-01-20 ENCOUNTER — Telehealth (INDEPENDENT_AMBULATORY_CARE_PROVIDER_SITE_OTHER): Payer: Medicaid Other

## 2019-01-20 DIAGNOSIS — F419 Anxiety disorder, unspecified: Secondary | ICD-10-CM

## 2019-01-20 DIAGNOSIS — F329 Major depressive disorder, single episode, unspecified: Secondary | ICD-10-CM

## 2019-01-20 DIAGNOSIS — F32A Depression, unspecified: Secondary | ICD-10-CM
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
## 2019-01-26 NOTE — Progress Notes (Signed)
[facility] Psychiatric Consultant Case Review   Joyce [name] is a 23 y.o. year old female with a history of anxiety, marijuana use, migraine, renal dysfunction. She has mild depressive symptoms and worsening in anxiety in the context of having visitors/the family of her son's father. Other stressors includes taking care of her two children (57 year old son, 4 year old daughter). She has appetite loss and had lost six pounds over several weeks. She is employed as a Programme researcher, broadcasting/film/video at [employer]. She has good support from her mother and her siblings.   Past trials of medication: sertraline, citalopram, buspar, clonazepam  Assessment/Provisional Diagnosis # Unspecified anxiety disorder Will continue current medication given it is recently started (note that the patient reports use of citalopram instead of fluoxetine, which is prescribed on 7/22 per Epic. [place] specialist to verify the situation)   Recommendation - Continue fluoxetine 10 mg daily. Consider uptitration in the future if she has residual mood symptoms.  -Continue buspar 10 mg BID - BH specialist to coach sleep hygiene, explore value congruent action.   Thank you for your consult. We will continue to follow the patient. Please contact [place]  for any questions or concerns.   The above treatment considerations and suggestions are based on consultation with the [facility] specialist and/or PCP and a review of information available in the shared registry and the patient's [place] Record (EHR). I have not personally examined the patient. All recommendations should be implemented with consideration of the patient's relevant prior history and current clinical status. Please feel free to call me with any questions about the care of this patient.

## 2019-01-31 ENCOUNTER — Ambulatory Visit (INDEPENDENT_AMBULATORY_CARE_PROVIDER_SITE_OTHER): Payer: Medicaid Other

## 2019-01-31 ENCOUNTER — Encounter: Payer: Self-pay | Admitting: Emergency Medicine

## 2019-01-31 ENCOUNTER — Other Ambulatory Visit: Payer: Self-pay

## 2019-01-31 ENCOUNTER — Ambulatory Visit
Admission: EM | Admit: 2019-01-31 | Discharge: 2019-01-31 | Disposition: A | Discharge status: Home / Self Care | Payer: Medicaid Other | Attending: Emergency Medicine | Admitting: Emergency Medicine

## 2019-01-31 DIAGNOSIS — M79631 Pain in right forearm: Secondary | ICD-10-CM | POA: Diagnosis not present

## 2019-01-31 DIAGNOSIS — S6991XA Unspecified injury of right wrist, hand and finger(s), initial encounter: Secondary | ICD-10-CM

## 2019-01-31 DIAGNOSIS — M79641 Pain in right hand: Secondary | ICD-10-CM | POA: Diagnosis not present

## 2019-01-31 DIAGNOSIS — M25531 Pain in right wrist: Secondary | ICD-10-CM | POA: Diagnosis not present

## 2019-01-31 MED ORDER — ACETAMINOPHEN 325 MG PO TABS
650.0000 mg | ORAL_TABLET | Freq: Once | ORAL | Status: AC
  Administered 2019-01-31: 650 mg via ORAL

## 2019-01-31 MED ORDER — ACETAMINOPHEN 325 MG PO TABS: 650.0000 mg | ORAL_TABLET | Freq: Four times a day (QID) | ORAL | 0 refills | Status: DC | PRN
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
## 2019-01-31 NOTE — Discharge Instructions (Signed)
Tylenol given in office X-rays did not show fracture or dislocation Continue conservative management of rest, ice, and elevation Ulnar gutter splint applied.  Wear as needed for comfort for the next 1-2 weeks.  May transition to OTC wrist brace as needed Take tylenol extra strength as needed for pain and inflammation Follow up with PCP or with Orthopedist if symptoms persist Return or go to the ER if you have any new or worsening symptoms (fever, chills, increased swelling, redness, pain, changes in sensation, symptoms do not improve despite treatment, etc...)

## 2019-02-02 ENCOUNTER — Other Ambulatory Visit: Payer: Self-pay

## 2019-02-02 ENCOUNTER — Ambulatory Visit (INDEPENDENT_AMBULATORY_CARE_PROVIDER_SITE_OTHER): Payer: Medicaid Other

## 2019-02-02 DIAGNOSIS — Z3042 Encounter for surveillance of injectable contraceptive: Secondary | ICD-10-CM

## 2019-02-02 MED ORDER — MEDROXYPROGESTERONE ACETATE 150 MG/ML IM SUSP
150.0000 mg | Freq: Once | INTRAMUSCULAR | Status: AC
  Administered 2019-02-02: 150 mg via INTRAMUSCULAR

## 2019-02-16 ENCOUNTER — Telehealth: Payer: Self-pay | Admitting: *Deleted

## 2019-02-16 ENCOUNTER — Encounter: Payer: Self-pay | Admitting: Family Medicine

## 2019-02-16 ENCOUNTER — Ambulatory Visit (INDEPENDENT_AMBULATORY_CARE_PROVIDER_SITE_OTHER): Payer: Medicaid Other | Admitting: Family Medicine

## 2019-02-16 ENCOUNTER — Other Ambulatory Visit: Payer: Self-pay

## 2019-02-16 VITALS — BP 140/90 | HR 71 | Temp 98.4°F | Resp 12 | Ht 68.5 in | Wt 129.1 lb

## 2019-02-16 DIAGNOSIS — I10 Essential (primary) hypertension: Secondary | ICD-10-CM | POA: Diagnosis not present

## 2019-02-16 DIAGNOSIS — R636 Underweight: Secondary | ICD-10-CM

## 2019-02-16 DIAGNOSIS — F419 Anxiety disorder, unspecified: Secondary | ICD-10-CM | POA: Diagnosis not present

## 2019-02-16 DIAGNOSIS — F411 Generalized anxiety disorder: Secondary | ICD-10-CM | POA: Insufficient documentation

## 2019-02-16 DIAGNOSIS — Z23 Encounter for immunization: Secondary | ICD-10-CM | POA: Diagnosis not present

## 2019-02-16 MED ORDER — BUSPIRONE HCL 15 MG PO TABS: 15.0000 mg | ORAL_TABLET | Freq: Three times a day (TID) | ORAL | 1 refills | Status: DC

## 2019-02-16 MED ORDER — ESCITALOPRAM OXALATE 10 MG PO TABS: 10.0000 mg | ORAL_TABLET | Freq: Every day | ORAL | 1 refills | Status: DC

## 2019-02-16 MED ORDER — AMLODIPINE BESYLATE 5 MG PO TABS: 5.0000 mg | ORAL_TABLET | Freq: Every day | ORAL | 1 refills | Status: DC

## 2019-02-16 MED ORDER — AMLODIPINE BESYLATE 5 MG PO TABS: 2.5000 mg | ORAL_TABLET | Freq: Every day | ORAL | 1 refills | Status: DC
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
## 2019-02-16 NOTE — Telephone Encounter (Signed)
Joyce [name] at [facility] called received two different prescriptions of amlodopine with 2 different strengths and needed clarification as to which one to fill would like a call back at [phone]

## 2019-02-16 NOTE — Patient Instructions (Signed)
   Thank you for coming into the office today. I appreciate the opportunity to provide you with the care for your health and wellness. Today we discussed:  Anxiety  Follow Up: 3-4 weeks  No labs today  Continue working [facility]  Start 5 mg of Norvasc for BP control Start Lexapro in place of Prozac. Increase Buspar as needed.   Continue to walk daily. Continue to eat well balanced.  Pick some ensure or boost to supplement diet. Drink at least once daily.  Please continue to practice social distancing to keep you, your family, and our community safe.  If you must go out, please wear a Mask and practice good handwashing.  [place] YOUR HANDS WELL AND FREQUENTLY. AVOID TOUCHING YOUR FACE, UNLESS YOUR HANDS ARE FRESHLY WASHED.  GET FRESH AIR DAILY. STAY HYDRATED WITH WATER.   It was a pleasure to see you and I look forward to continuing to work together on your health and well-being. Please do not hesitate to call the office if you need care or have questions about your care.  Have a wonderful day and week. With Gratitude, Cherly Beach, DNP, AGNP-BC

## 2019-02-17 NOTE — Telephone Encounter (Signed)
Spoke with pharmacy and told them to fill the 5 mg Norvasc

## 2019-03-08 ENCOUNTER — Ambulatory Visit: Payer: Medicaid Other | Admitting: Family Medicine

## 2019-03-09 ENCOUNTER — Ambulatory Visit: Payer: Medicaid Other | Admitting: Family Medicine

## 2019-03-09 ENCOUNTER — Telehealth: Payer: Self-pay

## 2019-03-09 DIAGNOSIS — F419 Anxiety disorder, unspecified: Secondary | ICD-10-CM

## 2019-03-09 NOTE — BH Specialist Note (Signed)
[facility] Telephone Follow-up  MRN: [id number] NAME: Joyce [name] Date: March 28, 2019  Start time: Start Time: 0900 End time: Stop Time: 0930 Total time: Total Time in Minutes (Visit): 30 Call number: Visit Number: 2- Second Visit  Reason for call today: Reason for Contact: PHQ9-2 weeks  PHQ-9 Scores:  Depression screen [facility] 2/9 March 28, 2019 02/16/2019 01/19/2019 01/05/2019  Decreased Interest 1 0 0 0  Down, Depressed, Hopeless 1 3 3  0  PHQ - 2 Score 2 3 3  0  Altered sleeping 0 1 1 -  Tired, decreased energy 0 1 1 -  Change in appetite 0 3 3 -  Feeling bad or failure about yourself  2 0 0 -  Trouble concentrating 1 0 0 -  Moving slowly or fidgety/restless 0 1 0 -  Suicidal thoughts 0 0 - -  PHQ-9 Score 5 9 8  -  Difficult doing work/chores Somewhat difficult Somewhat difficult Extremely dIfficult -     GAD-7 Scores:  GAD 7 : Generalized Anxiety Score 2019-03-28 02/16/2019 01/19/2019  Nervous, Anxious, on Edge 2 3 3   Control/stop worrying 2 2 3   Worry too much - different things 2 2 3   Trouble relaxing 1 3 3   Restless 1 2 3   Easily annoyed or irritable 1 3 3   Afraid - awful might happen 1 1 3   Total GAD 7 Score 10 16 21   Anxiety Difficulty - Somewhat difficult Somewhat difficult    Stress Current stressors: Current Stressors: (None Reported) Sleep: Sleep: No problems Appetite: Appetite: Increased, Weight gain(3 pounds) Coping ability: Coping ability: Normal Patient taking medications as prescribed: Patient taking medications as prescribed: Yes  Current medications:  Outpatient Encounter Medications as of 03/28/2019  Medication Sig  . acetaminophen (TYLENOL) 325 MG tablet Take 2 tablets (650 mg total) by mouth every 6 (six) hours as needed.  Marland Kitchen amLODipine (NORVASC) 5 MG tablet Take 1 tablet (5 mg total) by mouth daily.  . busPIRone (BUSPAR) 15 MG tablet Take 1 tablet (15 mg total) by mouth 3 (three) times daily.  Marland Kitchen escitalopram (LEXAPRO) 10 MG tablet Take 1 tablet (10 mg  total) by mouth daily.  . medroxyPROGESTERone Acetate 150 MG/ML SUSY Inject 1 mL (150 mg total) into the muscle once for 1 dose.   No facility-administered encounter medications on file as of 2019/03/28.      Self-harm Behaviors Risk Assessment Self-harm risk factors: Self-harm risk factors: (None Reported) Patient endorses recent thoughts of harming self:    [place] Suicide Severity Rating Scale: No flowsheet data found. C-SRSS 03/28/2019  1. Wish to be Dead No  2. Suicidal Thoughts No  6. Suicide Behavior Question No     Danger to Others Risk Assessment Danger to others risk factors: Danger to Others Risk Factors: No risk factors noted Patient endorses recent thoughts of harming others: Notification required: No need or identified person    Substance Use Assessment Patient recently consumed alcohol:   None reported Alcohol Use Disorder Identification Test (AUDIT): No flowsheet data found. Patient recently used drugs:  None Reported   Goals, Interventions and Follow-up Plan Goals: Increase healthy adjustment to current life circumstances Interventions: Behavioral Activation and Supportive Counseling Follow-up Plan: [facility] Phone Follow UP   Summary:    Patient is a 23 year old female.  Patient has a decrease in her PHQ and GAD score.  Patient reports that her anxiety has decreased due to her a change in her psychiatric medication.  Patient reports that there was an increase in Buspar  medication, and she was switched to Lexapro from Prozac.  Patient denies negative side effects from her medication.  Patient reports compliance with her psychiatric medication.   Patient reports she has been gaining her weight back.   Patient reports that since her medication has switched, she has gained 3 pounds.   Writer praised patient for making her own health a priority.  Writer discussed mindfulness.   Writer discussed factors that have caused the patient to quit her job.  Patient reports that  when she was working at [employer] caused her anxiety to increase.  Patient reports she has a second interview at [employer].    Medication:  Patient reports compliance with taking her medication .  Patient denies any negative side effects.    Patient denies SI/HI/Psychosis/Substance Abuse. If your symptoms worsen or you have thoughts of suicide/homicide, PLEASE SEEK IMMEDIATE MEDICAL ATTENTION.  You may always call:  National Suicide Hotline: (306)243-3845931 867 0904; [place] Crisis Line: 209 576 7891806-842-1213; [place]: [phone].  These are available 24 hours a day, 7 days a week.   During the next session:   Patient will engage in self care activities.  Patient will continue to journal when she begins to experience anxiety.     Phillip HealStevenson, Tesslyn Baumert LaVer[place], LCAS-A

## 2019-03-16 ENCOUNTER — Telehealth: Payer: Self-pay

## 2019-03-16 ENCOUNTER — Encounter: Payer: Self-pay | Admitting: Family Medicine

## 2019-03-16 ENCOUNTER — Other Ambulatory Visit: Payer: Self-pay

## 2019-03-16 ENCOUNTER — Encounter (INDEPENDENT_AMBULATORY_CARE_PROVIDER_SITE_OTHER): Payer: Self-pay

## 2019-03-16 ENCOUNTER — Ambulatory Visit (INDEPENDENT_AMBULATORY_CARE_PROVIDER_SITE_OTHER): Payer: Medicaid Other | Admitting: Family Medicine

## 2019-03-16 VITALS — BP 134/80 | HR 97 | Temp 98.6°F | Resp 12 | Ht 68.0 in | Wt 130.0 lb

## 2019-03-16 DIAGNOSIS — F419 Anxiety disorder, unspecified: Secondary | ICD-10-CM

## 2019-03-16 DIAGNOSIS — R638 Other symptoms and signs concerning food and fluid intake: Secondary | ICD-10-CM | POA: Diagnosis not present

## 2019-03-16 DIAGNOSIS — I10 Essential (primary) hypertension: Secondary | ICD-10-CM

## 2019-03-16 MED ORDER — ESCITALOPRAM OXALATE 10 MG PO TABS: 10.0000 mg | ORAL_TABLET | Freq: Every day | ORAL | 1 refills | Status: DC

## 2019-03-16 NOTE — Progress Notes (Signed)
Subjective:     Patient ID: Joyce [name], female   DOB: 1996/02/26, 23 y.o.   MRN: [id number]  Joyce [name] presents for Anxiety (doing better)  Anxiety: Is currently being followed by Ava with behavioral health is doing much better per her.  Additionally she reports the Lexapro has helped tremendously with her anxiety level.  She reports she is not had to really take any of the BuSpar.  The only thing that she did notice is that she did smoke a pack of cigarettes which she had not been smoking in years.  She reports that she thinks this is possibly because she is bored.  She smoked them very quickly within a week.  Reports that she felt very nauseous and jittery and anxious afterwards.  She does not want to smoke again.  Weight: Reports that she lost 2 pounds after our last visit.  And then she started being able to put on weight.  She is drinking ensures and breakfast essentials.  Reports that after 2 PM she is usually the hungry yesterday and will eat just perfectly fine.  She reports that her anxiety is not impairing any of her eating anymore.   She reports that she needs to find hobbies and she knows that she needs to do this to occupy her time.  Specially so that she will not pick up smoking.  Additionally she is little bit stressed because her children being at home and doing the virtual learning they are about to go back to school so this should help some.  Today patient denies signs and symptoms of COVID 19 infection including fever, chills, cough, shortness of breath, and headache.  Past Medical, Surgical, Social History, Allergies, and Medications have been Reviewed.   Past Medical History:  Diagnosis Date  . Allergy    seasonal  . Anxiety   . Heart murmur    was told that she had beginning stages  . Migraine   . Renal dysfunction   . Severe preeclampsia   . Short interval between pregnancies complicating pregnancy in third trimester, antepartum   . Substance abuse  ([place])    uses marijuanna  . Thyroid disease    right side removed   Past Surgical History:  Procedure Laterality Date  . THYROID SURGERY     Social History   Socioeconomic History  . Marital status: Single    Spouse name: Not on file  . Number of children: 2  . Years of education: Not on file  . Highest education level: High school graduate  Occupational History  . Not on file  Social Needs  . Financial resource strain: Not hard at all  . Food insecurity    Worry: Never true    Inability: Never true  . Transportation needs    Medical: No    Non-medical: No  Tobacco Use  . Smoking status: Former Smoker    Types: Cigarettes  . Smokeless tobacco: Never Used  Substance and Sexual Activity  . Alcohol use: Yes    Comment: occasion  . Drug use: Yes    Frequency: 1.0 times per week    Types: Marijuana  . Sexual activity: Yes    Birth control/protection: Injection  Lifestyle  . Physical activity    Days per week: 2 days    Minutes per session: 30 min  . Stress: Very much  Relationships  . Social connections    Talks on phone: More than three times a week  Gets together: Once a week    Attends religious service: Never    Active member of club or organization: No    Attends meetings of clubs or organizations: Never    Relationship status: Never married  . Intimate partner violence    Fear of current or ex partner: No    Emotionally abused: No    Physically abused: No    Forced sexual activity: No  Other Topics Concern  . Not on file  Social History Narrative   Lives with two kids   Auberi: 5 (full time)    Jaxon: 4 (half and half) [place]    Fathers are in the lives, help take care of children      Enjoys: playing with kids, going out to eat, movies and music      Diet: Does not feel like she eats as good as she should. Does eat veggies and fruits; will drink v8   Caffeine: pepis -4-5 cans a day, has slowed down    Water: gallon daily      Wear seat belt    Does not wear sunscreen   Smoke and carbon monoxide detectors   Does not use phone while driving, unless it is a call.     Outpatient Encounter Medications as of 03/16/2019  Medication Sig  . acetaminophen (TYLENOL) 325 MG tablet Take 2 tablets (650 mg total) by mouth every 6 (six) hours as needed.  Marland Kitchen. amLODipine (NORVASC) 5 MG tablet Take 1 tablet (5 mg total) by mouth daily.  . busPIRone (BUSPAR) 15 MG tablet Take 1 tablet (15 mg total) by mouth 3 (three) times daily.  Marland Kitchen. escitalopram (LEXAPRO) 10 MG tablet Take 1 tablet (10 mg total) by mouth daily.  . medroxyPROGESTERone Acetate 150 MG/ML SUSY Inject 1 mL (150 mg total) into the muscle once for 1 dose.  . [DISCONTINUED] escitalopram (LEXAPRO) 10 MG tablet Take 1 tablet (10 mg total) by mouth daily.   No facility-administered encounter medications on file as of 03/16/2019.    No Known Allergies  Review of Systems  Constitutional: Negative for chills and fever.  HENT: Negative.   Eyes: Negative.   Respiratory: Negative.  Negative for cough and shortness of breath.   Cardiovascular: Negative.   Gastrointestinal: Negative.   Endocrine: Negative.   Genitourinary: Negative.   Musculoskeletal: Negative.   Skin: Negative.   Allergic/Immunologic: Negative.   Neurological: Negative.   Hematological: Negative.   Psychiatric/Behavioral: Negative.   All other systems reviewed and are negative.      Objective:     BP 134/80   Pulse 97   Temp 98.6 F (37 C) (Oral)   Resp 12   Ht 5\' 8"  (1.727 m)   Wt 130 lb 0.6 oz (59 kg)   SpO2 97%   BMI 19.77 kg/m   Physical Exam Vitals signs and nursing note reviewed.  Constitutional:      Appearance: Normal appearance. She is well-developed and well-groomed.  HENT:     Head: Normocephalic and atraumatic.     Right Ear: External ear normal.     Left Ear: External ear normal.     Nose: Nose normal.     Mouth/Throat:     Mouth: Mucous membranes are moist.     Pharynx: Oropharynx is  clear.  Eyes:     General:        Right eye: No discharge.        Left eye: No discharge.  Conjunctiva/sclera: Conjunctivae normal.  Neck:     Musculoskeletal: Normal range of motion and neck supple.  Cardiovascular:     Rate and Rhythm: Normal rate and regular rhythm.     Pulses: Normal pulses.     Heart sounds: Normal heart sounds.  Pulmonary:     Effort: Pulmonary effort is normal.     Breath sounds: Normal breath sounds.  Musculoskeletal: Normal range of motion.  Skin:    General: Skin is warm.  Neurological:     General: No focal deficit present.     Mental Status: She is alert and oriented to person, place, and time.  Psychiatric:        Attention and Perception: Attention normal.        Mood and Affect: Mood normal.        Speech: Speech normal.        Behavior: Behavior normal. Behavior is cooperative.        Thought Content: Thought content normal.        Cognition and Memory: Cognition normal.        Judgment: Judgment normal.    Depression screen [place] 2/9 03/16/2019 03/09/2019 02/16/2019  Decreased Interest 0 1 0  Down, Depressed, Hopeless 0 1 3  PHQ - 2 Score 0 2 3  Altered sleeping - 0 1  Tired, decreased energy - 0 1  Change in appetite - 0 3  Feeling bad or failure about yourself  - 2 0  Trouble concentrating - 1 0  Moving slowly or fidgety/restless - 0 1  Suicidal thoughts - 0 0  PHQ-9 Score - 5 9  Difficult doing work/chores - Somewhat difficult Somewhat difficult    GAD 7 : Generalized Anxiety Score 03/16/2019 03/09/2019 02/16/2019 01/19/2019  Nervous, Anxious, on Edge 0 2 3 3   Control/stop worrying 0 2 2 3   Worry too much - different things 0 2 2 3   Trouble relaxing 1 1 3 3   Restless 3 1 2 3   Easily annoyed or irritable 1 1 3 3   Afraid - awful might happen 0 1 1 3   Total GAD 7 Score 5 10 16 21   Anxiety Difficulty Somewhat difficult - Somewhat difficult Somewhat difficult       Assessment and Plan        1. Anxiety Much improved. Continue  lexapro and therapy with Ava  - escitalopram (LEXAPRO) 10 MG tablet; Take 1 tablet (10 mg total) by mouth daily.  Dispense: 90 tablet; Refill: 1  2. Essential hypertension Still on higher end than I would like, but controlled, continue current medication regimen.  Will recheck when her stress level has continue to drop over next few months.  3. Unable to maintain weight She was having trouble with maintain weight. She has gained a pound. Encouraged continued use of Ensures and healthy eating when she is hungry.   Follow Up: 3 months       Freddy Finner, DNP, AGNP-BC [facility] [address], [address] [place], [place] [address] Office Hours: Mon-Thurs 8 am-5 pm; Fri 8 am-12 pm Office Phone:  [phone]  Office Fax: [phone]

## 2019-03-16 NOTE — Telephone Encounter (Signed)
[facility] -  Left message  

## 2019-03-16 NOTE — Patient Instructions (Addendum)
   Thank you for coming into the office today. I appreciate the opportunity to provide you with the care for your health and wellness. Today we discussed: mood  Follow Up: 3 months  No labs  SO HAPPY YOU ARE FEELING BETTER.  Continue speaking to Ava and taking Lexapro Continue your Ensures and Breakfast Essential in the morning.  Congrats on not continuing to smoke.  Please continue to practice social distancing to keep you, your family, and our community safe.  If you must go out, please wear a Mask and practice good handwashing.  [place] YOUR HANDS WELL AND FREQUENTLY. AVOID TOUCHING YOUR FACE, UNLESS YOUR HANDS ARE FRESHLY WASHED.  GET FRESH AIR DAILY. STAY HYDRATED WITH WATER.   It was a pleasure to see you and I look forward to continuing to work together on your health and well-being. Please do not hesitate to call the office if you need care or have questions about your care.  Have a wonderful day and week. With Gratitude, Cherly Beach, DNP, AGNP-BC

## 2019-03-22 ENCOUNTER — Telehealth: Payer: Self-pay

## 2019-03-22 NOTE — Telephone Encounter (Signed)
[facility] - Left Message  

## 2019-03-23 ENCOUNTER — Telehealth: Payer: Self-pay

## 2019-03-23 NOTE — Telephone Encounter (Signed)
2nd attempt   According to the chart review on 02-16-2019, "Increased Buspar and switched to Lexapro from Prozac" " -busPIRone (BUSPAR) 15 MG tablet; Take 1 tablet (15 mg total) by mouth 3 (three) times daily.  Dispense: 90 tablet; Refill: 1        - escitalopram (LEXAPRO) 10 MG tablet; Take 1 tablet (10 mg total) by mouth daily.  Dispense: 30 tablet; Refill: 1

## 2019-03-30 ENCOUNTER — Telehealth: Payer: Self-pay

## 2019-03-30 NOTE — Telephone Encounter (Addendum)
3rd attempt - [facility]  

## 2019-04-04 ENCOUNTER — Telehealth: Payer: Self-pay

## 2019-04-04 NOTE — Telephone Encounter (Signed)
4th attempt - [facility]   

## 2019-04-05 ENCOUNTER — Telehealth: Payer: Self-pay

## 2019-04-05 NOTE — Telephone Encounter (Signed)
4th attempt - [facility].  Information routed to PCP and Dr. Modesta Messing

## 2019-04-12 ENCOUNTER — Telehealth: Payer: Self-pay

## 2019-04-12 NOTE — Telephone Encounter (Signed)
Several attempts have been made to contact patient without success. Patient will be placed on the inactive list.  If services are needed again.  Please contact [facility] at [phone].    Information will be routed to the PCP and Dr. Hisada  

## 2019-04-15 ENCOUNTER — Other Ambulatory Visit: Payer: Self-pay

## 2019-04-15 ENCOUNTER — Ambulatory Visit
Admission: EM | Admit: 2019-04-15 | Discharge: 2019-04-15 | Disposition: A | Discharge status: Home / Self Care | Payer: Medicaid Other | Attending: Emergency Medicine | Admitting: Emergency Medicine

## 2019-04-15 DIAGNOSIS — Z20828 Contact with and (suspected) exposure to other viral communicable diseases: Secondary | ICD-10-CM | POA: Diagnosis not present

## 2019-04-15 DIAGNOSIS — Z20822 Contact with and (suspected) exposure to covid-19: Secondary | ICD-10-CM

## 2019-04-15 NOTE — ED Triage Notes (Signed)
Pt had positive exposure and wants testing

## 2019-04-15 NOTE — ED Provider Notes (Signed)
[facility]   [id number] 04/15/19 Arrival Time: 1550   CC: COVID exposure  SUBJECTIVE: History from: patient.  Joyce [name] is a 23 y.o. female who presents for COVID testing.  Reports positive exposure to COVID, flu, AND strep 2 days ago.  Friend'ss two kids tested positive for COVID, flu, and strep.  Denies recent travel.  Denies aggravating or alleviating symptoms.  Denies previous COVID infection.   Denies fever, chills, fatigue, nasal congestion, rhinorrhea, sore throat, cough, SOB, wheezing, chest pain, nausea, vomiting, changes in bowel or bladder habits.     ROS: As per HPI.  All other pertinent ROS negative.     Past Medical History:  Diagnosis Date  . Allergy    seasonal  . Anxiety   . Heart murmur    was told that she had beginning stages  . Migraine   . Renal dysfunction   . Severe preeclampsia   . Short interval between pregnancies complicating pregnancy in third trimester, antepartum   . Substance abuse ([facility])    uses marijuanna  . Thyroid disease    right side removed   Past Surgical History:  Procedure Laterality Date  . THYROID SURGERY     No Known Allergies No current facility-administered medications on file prior to encounter.    Current Outpatient Medications on File Prior to Encounter  Medication Sig Dispense Refill  . acetaminophen (TYLENOL) 325 MG tablet Take 2 tablets (650 mg total) by mouth every 6 (six) hours as needed. 20 tablet 0  . amLODipine (NORVASC) 5 MG tablet Take 1 tablet (5 mg total) by mouth daily. 90 tablet 1  . busPIRone (BUSPAR) 15 MG tablet Take 1 tablet (15 mg total) by mouth 3 (three) times daily. 90 tablet 1  . escitalopram (LEXAPRO) 10 MG tablet Take 1 tablet (10 mg total) by mouth daily. 90 tablet 1  . medroxyPROGESTERone Acetate 150 MG/ML SUSY Inject 1 mL (150 mg total) into the muscle once for 1 dose. 1 Syringe 3   Social History   Socioeconomic History  . Marital status: Single    Spouse name: Not on  file  . Number of children: 2  . Years of education: Not on file  . Highest education level: High school graduate  Occupational History  . Not on file  Social Needs  . Financial resource strain: Not hard at all  . Food insecurity    Worry: Never true    Inability: Never true  . Transportation needs    Medical: No    Non-medical: No  Tobacco Use  . Smoking status: Former Smoker    Types: Cigarettes  . Smokeless tobacco: Never Used  Substance and Sexual Activity  . Alcohol use: Yes    Comment: occasion  . Drug use: Yes    Frequency: 1.0 times per week    Types: Marijuana  . Sexual activity: Yes    Birth control/protection: Injection  Lifestyle  . Physical activity    Days per week: 2 days    Minutes per session: 30 min  . Stress: Very much  Relationships  . Social connections    Talks on phone: More than three times a week    Gets together: Once a week    Attends religious service: Never    Active member of club or organization: No    Attends meetings of clubs or organizations: Never    Relationship status: Never married  . Intimate partner violence    Fear of current  or ex partner: No    Emotionally abused: No    Physically abused: No    Forced sexual activity: No  Other Topics Concern  . Not on file  Social History Narrative   Lives with two kids   Auberi: 5 (full time)    Jaxon: 4 (half and half) [place]    Fathers are in the lives, help take care of children      Enjoys: playing with kids, going out to eat, movies and music      Diet: Does not feel like she eats as good as she should. Does eat veggies and fruits; will drink v8   Caffeine: pepis -4-5 cans a day, has slowed down    Water: gallon daily      Wear seat belt   Does not wear sunscreen   Smoke and carbon monoxide detectors   Does not use phone while driving, unless it is a call.    Family History  Problem Relation Age of Onset  . Hypertension Mother   . Birth defects Paternal Grandmother   .  Hypertension Maternal Grandmother     OBJECTIVE:  Vitals:   04/15/19 1608  BP: 138/90  Pulse: 67  Resp: 18  Temp: 98.8 F (37.1 C)  SpO2: 97%     General appearance: alert; well-appearing nontoxic; speaking in full sentences and tolerating own secretions HEENT: NCAT; Ears: EACs clear, TMs pearly gray; Eyes: PERRL.  EOM grossly intact.  Nose: nares patent without rhinorrhea, Throat: oropharynx clear, tonsils non erythematous or enlarged, uvula midline  Neck: supple without LAD Lungs: unlabored respirations, symmetrical air entry; cough: absent; no respiratory distress; CTAB Heart: regular rate and rhythm.  Radial pulses 2+ symmetrical bilaterally Skin: warm and dry Psychological: alert and cooperative; normal mood and affect  ASSESSMENT & PLAN:  1. Close exposure to COVID-19 virus   2. Exposure to COVID-19 virus     COVID testing ordered.  It will take between 5-7 days for test results.  Someone will contact you regarding abnormal results.    In the meantime: You should remain isolated in your home for 10 days from symptom onset AND greater than 72 hours after symptoms resolution (absence of fever without the use of fever-reducing medication and improvement in respiratory symptoms), whichever is longer OR 14 days from exposure Get plenty of rest and push fluids Use OTC medications like ibuprofen or tylenol as needed fever or pain Call or go to the ED if you have any new or worsening symptoms such as fever, worsening cough, shortness of breath, chest tightness, chest pain, turning blue, changes in mental status, etc...  Reviewed expectations re: course of current medical issues. Questions answered. Outlined signs and symptoms indicating need for more acute intervention. Patient verbalized understanding. After Visit Summary given.         Lestine Box, PA-C 04/15/19 1652

## 2019-04-15 NOTE — Discharge Instructions (Addendum)
COVID testing ordered.  It will take between 5-7 days for test results.  Someone will contact you regarding abnormal results.   ° °In the meantime: °You should remain isolated in your home for 10 days from symptom onset AND greater than 72 hours after symptoms resolution (absence of fever without the use of fever-reducing medication and improvement in respiratory symptoms), whichever is longer °OR 14 days from exposure °Get plenty of rest and push fluids °Use OTC medications like ibuprofen or tylenol as needed fever or pain °Call or go to the ED if you have any new or worsening symptoms such as fever, worsening cough, shortness of breath, chest tightness, chest pain, turning blue, changes in mental status, etc... °

## 2019-04-17 LAB — NOVEL CORONAVIRUS, NAA: SARS-CoV-2, NAA: NOT DETECTED

## 2019-04-27 ENCOUNTER — Ambulatory Visit (INDEPENDENT_AMBULATORY_CARE_PROVIDER_SITE_OTHER): Payer: Medicaid Other

## 2019-04-27 ENCOUNTER — Other Ambulatory Visit: Payer: Self-pay

## 2019-04-27 DIAGNOSIS — Z3042 Encounter for surveillance of injectable contraceptive: Secondary | ICD-10-CM

## 2019-04-27 MED ORDER — MEDROXYPROGESTERONE ACETATE 150 MG/ML IM SUSP
150.0000 mg | Freq: Once | INTRAMUSCULAR | Status: AC
  Administered 2019-04-27: 150 mg via INTRAMUSCULAR

## 2019-04-27 NOTE — Progress Notes (Signed)
Patient presents today for Depo Provera injection within dates. Given IM RUOQ. Patient tolerated well. Patient reports she has been on Depo now for 6 months. This is her third injection. She is still experiencing irregular cycles/breakthrough bleeding. She has been bleeding for the past 1-1.5 months almost continuously.

## 2019-05-30 DIAGNOSIS — J029 Acute pharyngitis, unspecified: Secondary | ICD-10-CM | POA: Diagnosis not present

## 2019-05-30 DIAGNOSIS — R6889 Other general symptoms and signs: Secondary | ICD-10-CM | POA: Diagnosis not present

## 2019-05-30 DIAGNOSIS — Z20828 Contact with and (suspected) exposure to other viral communicable diseases: Secondary | ICD-10-CM | POA: Diagnosis not present

## 2019-05-30 DIAGNOSIS — J019 Acute sinusitis, unspecified: Secondary | ICD-10-CM | POA: Diagnosis not present

## 2019-06-15 ENCOUNTER — Telehealth: Payer: Medicaid Other | Admitting: Family Medicine

## 2019-06-15 ENCOUNTER — Other Ambulatory Visit: Payer: Self-pay

## 2019-06-20 ENCOUNTER — Encounter: Payer: Self-pay | Admitting: Family Medicine

## 2019-06-20 ENCOUNTER — Telehealth (INDEPENDENT_AMBULATORY_CARE_PROVIDER_SITE_OTHER): Payer: Medicaid Other | Admitting: Family Medicine

## 2019-06-20 ENCOUNTER — Other Ambulatory Visit: Payer: Self-pay

## 2019-06-20 DIAGNOSIS — F419 Anxiety disorder, unspecified: Secondary | ICD-10-CM | POA: Diagnosis not present

## 2019-06-20 DIAGNOSIS — R058 Other specified cough: Secondary | ICD-10-CM | POA: Insufficient documentation

## 2019-06-20 DIAGNOSIS — R05 Cough: Secondary | ICD-10-CM

## 2019-06-20 DIAGNOSIS — I10 Essential (primary) hypertension: Secondary | ICD-10-CM | POA: Diagnosis not present

## 2019-06-20 HISTORY — DX: Other specified cough: R05.8

## 2019-06-20 MED ORDER — ESCITALOPRAM OXALATE 10 MG PO TABS: 10.0000 mg | ORAL_TABLET | Freq: Every day | ORAL | 1 refills | Status: DC

## 2019-06-20 MED ORDER — BUSPIRONE HCL 15 MG PO TABS: 15.0000 mg | ORAL_TABLET | Freq: Three times a day (TID) | ORAL | 1 refills | Status: DC

## 2019-06-20 MED ORDER — ALBUTEROL SULFATE HFA 108 (90 BASE) MCG/ACT IN AERS: 1.0000 | INHALATION_SPRAY | Freq: Four times a day (QID) | RESPIRATORY_TRACT | 1 refills | Status: DC | PRN

## 2019-06-20 MED ORDER — AMLODIPINE BESYLATE 5 MG PO TABS: 5.0000 mg | ORAL_TABLET | Freq: Every day | ORAL | 1 refills | Status: DC

## 2019-06-20 NOTE — Patient Instructions (Signed)
  I appreciate the opportunity to provide you with care for your health and wellness. Today we discussed: anxiety and recent COVID infection-post viral cough  Follow up: July annual-vision no pap  No labs or referrals today  I am glad your anxiety has improved-please continue medications as ordered.  Please get inhaler to help with cough and chest tightness.  I hope you have a wonderful, happy, safe, and healthy Holiday Season! See you in the New Year :)  Please continue to practice social distancing to keep you, your family, and our community safe.  If you must go out, please wear a mask and practice good handwashing.  It was a pleasure to see you and I look forward to continuing to work together on your health and well-being. Please do not hesitate to call the office if you need care or have questions about your care.  Have a wonderful day and week. With Gratitude, Cherly Beach, DNP, AGNP-BC

## 2019-06-20 NOTE — Assessment & Plan Note (Signed)
No current readings for BP-continues norvasc at this time. Encourage low salt diet and exercise for 30-60 minutes at least 5 days of the week.

## 2019-06-20 NOTE — Progress Notes (Signed)
Virtual Visit via Video Note   This visit type was conducted due to national recommendations for restrictions regarding the COVID-19 Pandemic (e.g. social distancing) in an effort to limit this patient's exposure and mitigate transmission in our community.  Due to her co-morbid illnesses, this patient is at least at moderate risk for complications without adequate follow up.  This format is felt to be most appropriate for this patient at this time.  All issues noted in this document were discussed and addressed.  A limited physical exam was performed with this format.    Evaluation Performed:  Follow-up visit  Date:  06/20/2019   ID:  Joyce RamalJasmine M [name], DOB January 27, 1996, MRN [id number]  Patient Location: Home Provider Location: Office  Location of Patient: Home Location of Provider: Telehealth Consent was obtain for visit to be over via telehealth. I verified that I am speaking with the correct person using two identifiers.  PCP:  Freddy FinnerMills, Eulis Salazar M, NP   Chief Complaint:  Anxiety follow up  History of Present Illness:    Joyce [name] is a 23 y.o. female with history of allergies, anxiety, migraines, high blood pressure, marijuana use among others.  Reports today for follow-up on anxiety and post residual cough from having Covid several weeks ago.    Reports she is taking her Lexapro daily and that is helping her.  She reports that she does not need her BuSpar at this time as it is in about a month but would like to keep it on hand.  Needs refill.  Had Covid back at the beginning of December was advised to get some medications but was unable to afford them.  She was thought to have a sinus infection at that time.  She reports that she is doing better except for a cough that she continues to have.  And some chest tightness.  Is having inhaler but she cannot find it right now.    Overall she reports she is feeling good and not having any issues.  The patient does not have any new  symptoms concerning for COVID-19 re- infection (fever, chills, cough, or new shortness of breath).   Past Medical, Surgical, Social History, Allergies, and Medications have been Reviewed.  Past Medical History:  Diagnosis Date  . Allergy    seasonal  . Anxiety   . Elevated blood-pressure reading without diagnosis of hypertension 12/26/2014  . Elevated serum creatinine 12/26/2014  . Gestational hypertension 02/12/2015  . H/O acute renal failure 12/26/2014  . Heart murmur    was told that she had beginning stages  . Migraine   . Renal dysfunction   . Renal dysfunction 12/26/2014  . S/P thyroid surgery 12/26/2014  . Severe preeclampsia   . Short interval between pregnancies complicating pregnancy in third trimester, antepartum   . Substance abuse (HCC)    uses marijuanna  . Thyroid disease    right side removed   Past Surgical History:  Procedure Laterality Date  . THYROID SURGERY       Current Meds  Medication Sig  . acetaminophen (TYLENOL) 325 MG tablet Take 2 tablets (650 mg total) by mouth every 6 (six) hours as needed.  Marland Kitchen. amLODipine (NORVASC) 5 MG tablet Take 1 tablet (5 mg total) by mouth daily.  . busPIRone (BUSPAR) 15 MG tablet Take 1 tablet (15 mg total) by mouth 3 (three) times daily.  Marland Kitchen. escitalopram (LEXAPRO) 10 MG tablet Take 1 tablet (10 mg total) by mouth daily.  . [  DISCONTINUED] amLODipine (NORVASC) 5 MG tablet Take 1 tablet (5 mg total) by mouth daily.  . [DISCONTINUED] busPIRone (BUSPAR) 15 MG tablet Take 1 tablet (15 mg total) by mouth 3 (three) times daily.  . [DISCONTINUED] escitalopram (LEXAPRO) 10 MG tablet Take 1 tablet (10 mg total) by mouth daily.     Allergies:   Patient has no known allergies.   ROS:   Please see the history of present illness.    All other systems reviewed and are negative.   Labs/Other Tests and Data Reviewed:    Recent Labs: 01/05/2019: ALT 12; BUN 18; Creat 1.40; Potassium 4.5; Sodium 138; TSH 1.25   Recent Lipid Panel No  results found for: CHOL, TRIG, HDL, CHOLHDL, LDLCALC, LDLDIRECT  Wt Readings from Last 3 Encounters:  03/16/19 130 lb 0.6 oz (59 kg)  02/16/19 129 lb 1.9 oz (58.6 kg)  01/19/19 130 lb (59 kg)     Objective:    Vital Signs:  There were no vitals taken for this visit.   GEN:  Alert and oriented EYES:  Clear sclera, intact EOM RESPIRATORY:  No respiratory distress noted in conversation or seen on video, cough present SKIN:  Clear dry and intact,  PSYCH:  Normal affect, mood and good communication  ASSESSMENT & PLAN:   Please see problem list for details  1. Anxiety  - busPIRone (BUSPAR) 15 MG tablet; Take 1 tablet (15 mg total) by mouth 3 (three) times daily.  Dispense: 90 tablet; Refill: 1 - escitalopram (LEXAPRO) 10 MG tablet; Take 1 tablet (10 mg total) by mouth daily.  Dispense: 90 tablet; Refill: 1  2. Essential hypertension  - amLODipine (NORVASC) 5 MG tablet; Take 1 tablet (5 mg total) by mouth daily.  Dispense: 90 tablet; Refill: 1  3. Post-viral cough syndrome  - albuterol (VENTOLIN HFA) 108 (90 Base) MCG/ACT inhaler; Inhale 1-2 puffs into the lungs every 6 (six) hours as needed for wheezing or shortness of breath.  Dispense: 8 g; Refill: 1  Time:   Today, I have spent 10 minutes with the patient with telehealth technology discussing the above problems.     Medication Adjustments/Labs and Tests Ordered: Current medicines are reviewed at length with the patient today.  Concerns regarding medicines are outlined above.   Tests Ordered: No orders of the defined types were placed in this encounter.   Medication Changes: Meds ordered this encounter  Medications  . busPIRone (BUSPAR) 15 MG tablet    Sig: Take 1 tablet (15 mg total) by mouth 3 (three) times daily.    Dispense:  90 tablet    Refill:  1    Order Specific Question:   Supervising Provider    Answer:   SIMPSON, MARGARET E [[id number]]  . escitalopram (LEXAPRO) 10 MG tablet    Sig: Take 1 tablet (10 mg total)  by mouth daily.    Dispense:  90 tablet    Refill:  1    Order Specific Question:   Supervising Provider    Answer:   SIMPSON, MARGARET E [[id number]]  . amLODipine (NORVASC) 5 MG tablet    Sig: Take 1 tablet (5 mg total) by mouth daily.    Dispense:  90 tablet    Refill:  1    Please 5 mg thank you    Order Specific Question:   Supervising Provider    Answer:   SIMPSON, MARGARET E [[id number]]  . albuterol (VENTOLIN HFA) 108 (90 Base) MCG/ACT inhaler    Sig: Inhale  1-2 puffs into the lungs every 6 (six) hours as needed for wheezing or shortness of breath.    Dispense:  8 g    Refill:  1    Order Specific Question:   Supervising Provider    Answer:   Fayrene Helper [[id number]]    Disposition:  Follow up July months for annual  Signed, Perlie Mayo, NP  06/20/2019 11:53 AM     [place]

## 2019-06-20 NOTE — Assessment & Plan Note (Signed)
Post viral cough related to Covid infection.  Reports that she does get some coughing spells and has some chest tightness overall is doing okay cannot find her inhaler that she used to have.  Send an inhaler today to help manage this advised that if she has ongoing continuing or worsening symptoms her report to the nearest emergency room.[facility] Patient acknowledged agreement and understanding of the plan.  Marland Kitchen

## 2019-06-20 NOTE — Assessment & Plan Note (Signed)
Improved, continue current medications: lexapro and buspar.

## 2019-06-26 ENCOUNTER — Other Ambulatory Visit: Payer: Self-pay

## 2019-06-26 ENCOUNTER — Ambulatory Visit
Admission: EM | Admit: 2019-06-26 | Discharge: 2019-06-26 | Disposition: A | Discharge status: Home / Self Care | Payer: Medicaid Other | Attending: Emergency Medicine | Admitting: Emergency Medicine

## 2019-06-26 DIAGNOSIS — U071 COVID-19: Secondary | ICD-10-CM | POA: Diagnosis not present

## 2019-06-26 DIAGNOSIS — R05 Cough: Secondary | ICD-10-CM | POA: Diagnosis not present

## 2019-06-26 DIAGNOSIS — R053 Chronic cough: Secondary | ICD-10-CM

## 2019-06-26 MED ORDER — BENZONATATE 100 MG PO CAPS: 100.0000 mg | ORAL_CAPSULE | Freq: Three times a day (TID) | ORAL | 0 refills | Status: DC

## 2019-06-26 MED ORDER — HYDROCODONE-HOMATROPINE 5-1.5 MG/5ML PO SYRP: 5.0000 mL | ORAL_SOLUTION | Freq: Every evening | ORAL | 0 refills | Status: DC | PRN

## 2019-06-26 NOTE — ED Provider Notes (Signed)
[facility]   [id number] 06/26/19 Arrival Time: 1313   CC: COVID and cough  SUBJECTIVE: History from: patient.  Joyce [name] is a 23 y.o. female who presents with persistent cough with mild production with light brown sputum x few weeks.  Diagnosed with COVID 05/31/2019.  Follow up with PCP on 12/21 for cough, and prescribed albuterol.  Patient reports minimal improvement with albuterol.  Symptoms are made worse at night with associated wheezing.  Denies previous symptoms in the past.   Denies fever, chills, fatigue, sinus pain, rhinorrhea, sore throat, SOB, chest pain, nausea, changes in bowel or bladder habits.    ROS: As per HPI.  All other pertinent ROS negative.     Past Medical History:  Diagnosis Date  . Allergy    seasonal  . Anxiety   . Elevated blood-pressure reading without diagnosis of hypertension 12/26/2014  . Elevated serum creatinine 12/26/2014  . Gestational hypertension 02/12/2015  . H/O acute renal failure 12/26/2014  . Heart murmur    was told that she had beginning stages  . Migraine   . Renal dysfunction   . Renal dysfunction 12/26/2014  . S/P thyroid surgery 12/26/2014  . Severe preeclampsia   . Short interval between pregnancies complicating pregnancy in third trimester, antepartum   . Substance abuse (HCC)    uses marijuanna  . Thyroid disease    right side removed   Past Surgical History:  Procedure Laterality Date  . THYROID SURGERY     No Known Allergies No current facility-administered medications on file prior to encounter.   Current Outpatient Medications on File Prior to Encounter  Medication Sig Dispense Refill  . acetaminophen (TYLENOL) 325 MG tablet Take 2 tablets (650 mg total) by mouth every 6 (six) hours as needed. 20 tablet 0  . albuterol (VENTOLIN HFA) 108 (90 Base) MCG/ACT inhaler Inhale 1-2 puffs into the lungs every 6 (six) hours as needed for wheezing or shortness of breath. 8 g 1  . amLODipine (NORVASC) 5 MG tablet  Take 1 tablet (5 mg total) by mouth daily. 90 tablet 1  . busPIRone (BUSPAR) 15 MG tablet Take 1 tablet (15 mg total) by mouth 3 (three) times daily. 90 tablet 1  . escitalopram (LEXAPRO) 10 MG tablet Take 1 tablet (10 mg total) by mouth daily. 90 tablet 1  . medroxyPROGESTERone Acetate 150 MG/ML SUSY Inject 1 mL (150 mg total) into the muscle once for 1 dose. 1 Syringe 3   Social History   Socioeconomic History  . Marital status: Single    Spouse name: Not on file  . Number of children: 2  . Years of education: Not on file  . Highest education level: High school graduate  Occupational History  . Not on file  Tobacco Use  . Smoking status: Former Smoker    Types: Cigarettes  . Smokeless tobacco: Never Used  Substance and Sexual Activity  . Alcohol use: Yes    Comment: occasion  . Drug use: Yes    Frequency: 1.0 times per week    Types: Marijuana  . Sexual activity: Yes    Birth control/protection: Injection  Other Topics Concern  . Not on file  Social History Narrative   Lives with two kids   Auberi: 5 (full time)    Jaxon: 4 (half and half) [place]    Fathers are in the lives, help take care of children      Enjoys: playing with kids, going out to eat, movies  and music      Diet: Does not feel like she eats as good as she should. Does eat veggies and fruits; will drink v8   Caffeine: pepis -4-5 cans a day, has slowed down    Water: gallon daily      Wear seat belt   Does not wear sunscreen   Smoke and carbon monoxide detectors   Does not use phone while driving, unless it is a call.    Social Determinants of Health   Financial Resource Strain: Low Risk   . Difficulty of Paying Living Expenses: Not hard at all  Food Insecurity: No Food Insecurity  . Worried About Programme researcher, broadcasting/film/videounning Out of Food in the Last Year: Never true  . Ran Out of Food in the Last Year: Never true  Transportation Needs: No Transportation Needs  . Lack of Transportation (Medical): No  . Lack of  Transportation (Non-Medical): No  Physical Activity: Insufficiently Active  . Days of Exercise per Week: 2 days  . Minutes of Exercise per Session: 30 min  Stress: Stress Concern Present  . Feeling of Stress : Very much  Social Connections: Moderately Isolated  . Frequency of Communication with Friends and Family: More than three times a week  . Frequency of Social Gatherings with Friends and Family: Once a week  . Attends Religious Services: Never  . Active Member of Clubs or Organizations: No  . Attends BankerClub or Organization Meetings: Never  . Marital Status: Never married  Intimate Partner Violence: Not At Risk  . Fear of Current or Ex-Partner: No  . Emotionally Abused: No  . Physically Abused: No  . Sexually Abused: No   Family History  Problem Relation Age of Onset  . Hypertension Mother   . Birth defects Paternal Grandmother   . Hypertension Maternal Grandmother     OBJECTIVE:  Vitals:   06/26/19 1324  BP: 134/89  Pulse: 81  Resp: 18  Temp: 98.8 F (37.1 C)  SpO2: 97%     General appearance: alert; well-appearing, nontoxic; speaking in full sentences and tolerating own secretions HEENT: NCAT; Ears: EACs clear, TMs pearly gray; Eyes: PERRL.  EOM grossly intact. Nose: nares patent without rhinorrhea, Throat: oropharynx clear, tonsils non erythematous or enlarged, uvula midline  Neck: supple without LAD Lungs: unlabored respirations, symmetrical air entry; cough: mild; no respiratory distress; CTAB Heart: regular rate and rhythm.   Skin: warm and dry Psychological: alert and cooperative; normal mood and affect  ASSESSMENT & PLAN:  1. COVID-19 virus infection   2. Persistent cough     Meds ordered this encounter  Medications  . benzonatate (TESSALON) 100 MG capsule    Sig: Take 1 capsule (100 mg total) by mouth every 8 (eight) hours.    Dispense:  21 capsule    Refill:  0    Order Specific Question:   Supervising Provider    Answer:   Eustace MooreNELSON, YVONNE SUE  [[id number]]  . HYDROcodone-homatropine (HYCODAN) 5-1.5 MG/5ML syrup    Sig: Take 5 mLs by mouth at bedtime as needed for cough.    Dispense:  60 mL    Refill:  0    Order Specific Question:   Supervising Provider    Answer:   Eustace MooreELSON, YVONNE SUE [[id number]]   Get plenty of rest and push fluids Tessalon perles prescribed take as directed for cough Continue with albuterol inhaler as needed for shortness of breath and/or wheezing Hycodan prescribed.  Use at nighttime for nighttime relief of cough Follow  up with PCP in 1-2 days via phone or e-visit for recheck and to ensure symptoms are improving Call or go to the ED if you have any new or worsening symptoms such as fever, worsening cough, shortness of breath, chest tightness, chest pain, turning blue, changes in mental status, etc...    Reviewed expectations re: course of current medical issues. Questions answered. Outlined signs and symptoms indicating need for more acute intervention. Patient verbalized understanding. After Visit Summary given.         Lestine Box, PA-C 06/26/19 1358

## 2019-06-26 NOTE — ED Triage Notes (Signed)
Pt presents with c/o cough that developed after covid diagnosis a few weeks ago, pt given inhaler by pcp with no improvement in cough

## 2019-06-26 NOTE — Discharge Instructions (Signed)
Get plenty of rest and push fluids Tessalon perles prescribed take as directed for cough Continue with albuterol inhaler as needed for shortness of breath and/or wheezing Hycodan prescribed.  Use at nighttime for nighttime relief of cough Follow up with PCP in 1-2 days via phone or e-visit for recheck and to ensure symptoms are improving Call or go to the ED if you have any new or worsening symptoms such as fever, worsening cough, shortness of breath, chest tightness, chest pain, turning blue, changes in mental status, etc..Marland Kitchen

## 2019-07-20 ENCOUNTER — Other Ambulatory Visit: Payer: Self-pay

## 2019-07-20 ENCOUNTER — Ambulatory Visit: Payer: Medicaid Other

## 2019-07-20 ENCOUNTER — Ambulatory Visit (INDEPENDENT_AMBULATORY_CARE_PROVIDER_SITE_OTHER): Payer: Medicaid Other

## 2019-07-20 DIAGNOSIS — Z3042 Encounter for surveillance of injectable contraceptive: Secondary | ICD-10-CM

## 2019-07-20 MED ORDER — MEDROXYPROGESTERONE ACETATE 150 MG/ML IM SUSP
150.0000 mg | Freq: Once | INTRAMUSCULAR | Status: AC
  Administered 2019-07-20: 16:00:00 150 mg via INTRAMUSCULAR

## 2019-07-20 NOTE — Progress Notes (Signed)
Pt here for depo which was given IM right glut.  NDC# [address]-2 

## 2019-10-12 ENCOUNTER — Ambulatory Visit: Payer: Medicaid Other

## 2019-10-12 ENCOUNTER — Other Ambulatory Visit: Payer: Self-pay | Admitting: Obstetrics and Gynecology

## 2019-10-12 ENCOUNTER — Telehealth: Payer: Self-pay | Admitting: *Deleted

## 2019-10-12 DIAGNOSIS — Z3042 Encounter for surveillance of injectable contraceptive: Secondary | ICD-10-CM

## 2019-10-12 MED ORDER — MEDROXYPROGESTERONE ACETATE 150 MG/ML IM SUSY: 150.0000 mg | PREFILLED_SYRINGE | Freq: Once | INTRAMUSCULAR | 0 refills | Status: DC

## 2019-10-12 NOTE — Addendum Note (Signed)
Addended by: Donnetta Hail on: 10/12/2019 02:50 PM   Modules accepted: Orders

## 2019-10-12 NOTE — Telephone Encounter (Signed)
Thank you for helping her get the care she needs.

## 2019-10-12 NOTE — Telephone Encounter (Signed)
Pt called stating she had her blood pressure checked and it was extremely high wanted to get an appt advised that we did not have an appt coming up however she should go to urgent care or ER to be checked out as high blood pressure is dangerous. She stated she would go to one or the other. Just FYI

## 2019-10-13 ENCOUNTER — Emergency Department: Payer: Medicaid Other

## 2019-10-13 ENCOUNTER — Other Ambulatory Visit: Payer: Self-pay

## 2019-10-13 ENCOUNTER — Emergency Department
Admission: EM | Admit: 2019-10-13 | Discharge: 2019-10-13 | Disposition: A | Discharge status: Home / Self Care | Payer: Medicaid Other | Attending: Emergency Medicine | Admitting: Emergency Medicine

## 2019-10-13 DIAGNOSIS — R079 Chest pain, unspecified: Secondary | ICD-10-CM | POA: Diagnosis not present

## 2019-10-13 DIAGNOSIS — Z87891 Personal history of nicotine dependence: Secondary | ICD-10-CM | POA: Insufficient documentation

## 2019-10-13 DIAGNOSIS — I129 Hypertensive chronic kidney disease with stage 1 through stage 4 chronic kidney disease, or unspecified chronic kidney disease: Secondary | ICD-10-CM | POA: Diagnosis not present

## 2019-10-13 DIAGNOSIS — I159 Secondary hypertension, unspecified: Secondary | ICD-10-CM | POA: Diagnosis not present

## 2019-10-13 DIAGNOSIS — N189 Chronic kidney disease, unspecified: Secondary | ICD-10-CM | POA: Diagnosis not present

## 2019-10-13 DIAGNOSIS — R0789 Other chest pain: Secondary | ICD-10-CM | POA: Diagnosis present

## 2019-10-13 LAB — BASIC METABOLIC PANEL
Anion gap: 9 (ref 5–15)
BUN: 16 mg/dL (ref 6–20)
CO2: 26 mmol/L (ref 22–32)
Calcium: 9.1 mg/dL (ref 8.9–10.3)
Chloride: 102 mmol/L (ref 98–111)
Creatinine, Ser: 1.51 mg/dL — ABNORMAL HIGH (ref 0.44–1.00)
GFR calc Af Amer: 55 mL/min — ABNORMAL LOW (ref >60)
GFR calc non Af Amer: 48 mL/min — ABNORMAL LOW (ref >60)
Glucose, Bld: 82 mg/dL (ref 70–99)
Potassium: 4.1 mmol/L (ref 3.5–5.1)
Sodium: 137 mmol/L (ref 135–145)

## 2019-10-13 LAB — CBC
HCT: 44.7 % (ref 36.0–46.0)
Hemoglobin: 14.2 g/dL (ref 12.0–15.0)
MCH: 28.5 pg (ref 26.0–34.0)
MCHC: 31.8 g/dL (ref 30.0–36.0)
MCV: 89.8 fL (ref 80.0–100.0)
Platelets: 185 10*3/uL (ref 150–400)
RBC: 4.98 MIL/uL (ref 3.87–5.11)
RDW: 12.7 % (ref 11.5–15.5)
WBC: 6.3 10*3/uL (ref 4.0–10.5)
nRBC: 0 % (ref 0.0–0.2)

## 2019-10-13 LAB — TROPONIN I (HIGH SENSITIVITY): Troponin I (High Sensitivity): <2 ng/L (ref <18)

## 2019-10-13 MED ORDER — HYDROCHLOROTHIAZIDE 12.5 MG PO TABS: 12.5000 mg | ORAL_TABLET | Freq: Every day | ORAL | 0 refills | Status: DC

## 2019-10-13 NOTE — ED Provider Notes (Signed)
[facility] Emergency Department Provider Note  ____________________________________________   First MD Initiated Contact with Patient 10/13/19 1546     (approximate)  I have reviewed the triage vital signs and the nursing notes.   HISTORY  Chief Complaint Hypertension, Headache, and Chest Pain    HPI Joyce [name] is a 24 y.o. female with hypertension, CKD who comes in for high blood pressure.  Patient had a high blood pressure noted yesterday.  Patient reports being off of her blood pressure medicine for over 3 months.  On review of records it appears that patient was on amlodipine.  She stated that she did not like the way it made her feel.  It made her feel lightheaded and like she was going to pass out.  Patient states that she has had some intermittent chest pain has been going on for years now.  The pain is a pressure sensation in her chest.  Last episode was last night or early this morning.  Mild to moderate, went away on its own.  Nothing in particular brought it on.  She also reports some headaches.  Patient states that she is had headaches for 2 years and this feels like her typical headache.  No vomiting or difficulty walking.  No confusion.  Patient states the headache has not changed in character at all.  Patient had a physical done yesterday and told that her blood pressure was elevated that she needed to be seen for it.  Patient tried to get to her primary care doctor was told that she would not be able to be seen for 2 weeks which is why she presented to the ER.  Patient currently has a mild headache and no chest pain.          Past Medical History:  Diagnosis Date  . Allergy    seasonal  . Anxiety   . Elevated blood-pressure reading without diagnosis of hypertension 12/26/2014  . Elevated serum creatinine 12/26/2014  . Gestational hypertension 02/12/2015  . H/O acute renal failure 12/26/2014  . Heart murmur    was told that she had  beginning stages  . Migraine   . Renal dysfunction   . Renal dysfunction 12/26/2014  . S/P thyroid surgery 12/26/2014  . Severe preeclampsia   . Short interval between pregnancies complicating pregnancy in third trimester, antepartum   . Substance abuse (HCC)    uses marijuanna  . Thyroid disease    right side removed    Patient Active Problem List   Diagnosis Date Noted  . Post-viral cough syndrome 06/20/2019  . Essential hypertension 02/16/2019  . Anxiety 02/16/2019  . Tension headache 11/11/2018  . Underweight 12/26/2014    Past Surgical History:  Procedure Laterality Date  . THYROID SURGERY      Prior to Admission medications   Medication Sig Start Date End Date Taking? Authorizing Provider  medroxyPROGESTERone Acetate 150 MG/ML SUSY Inject 1 mL (150 mg total) into the muscle once for 1 dose. Patient taking differently: Inject 150 mg into the muscle every 3 (three) months.  10/12/19 10/13/19 Yes Copland, Ilona Sorrel, PA-C  acetaminophen (TYLENOL) 325 MG tablet Take 2 tablets (650 mg total) by mouth every 6 (six) hours as needed. 01/31/19   Wurst, [place], PA-C  albuterol (VENTOLIN HFA) 108 (90 Base) MCG/ACT inhaler Inhale 1-2 puffs into the lungs every 6 (six) hours as needed for wheezing or shortness of breath. 06/20/19   Freddy Finner, NP  amLODipine (NORVASC) 5  MG tablet Take 1 tablet (5 mg total) by mouth daily. Patient not taking: Reported on 10/13/2019 06/20/19   Perlie Mayo, NP  benzonatate (TESSALON) 100 MG capsule Take 1 capsule (100 mg total) by mouth every 8 (eight) hours. Patient not taking: Reported on 10/13/2019 06/26/19   Wurst, [place], PA-C  busPIRone (BUSPAR) 15 MG tablet Take 1 tablet (15 mg total) by mouth 3 (three) times daily. Patient not taking: Reported on 10/13/2019 06/20/19   Perlie Mayo, NP  escitalopram (LEXAPRO) 10 MG tablet Take 1 tablet (10 mg total) by mouth daily. Patient not taking: Reported on 10/13/2019 06/20/19   Perlie Mayo, NP    HYDROcodone-homatropine [facility]) 5-1.5 MG/5ML syrup Take 5 mLs by mouth at bedtime as needed for cough. Patient not taking: Reported on 10/13/2019 06/26/19   Lestine Box, PA-C    Allergies Patient has no known allergies.  Family History  Problem Relation Age of Onset  . Hypertension Mother   . Birth defects Paternal Grandmother   . Hypertension Maternal Grandmother     Social History Social History   Tobacco Use  . Smoking status: Former Smoker    Types: Cigarettes  . Smokeless tobacco: Never Used  Substance Use Topics  . Alcohol use: Yes    Comment: occasion  . Drug use: Yes    Frequency: 1.0 times per week    Types: Marijuana      Review of Systems Constitutional: No fever/chills Eyes: No visual changes. ENT: No sore throat. Cardiovascular: Positive chest pain Respiratory: Denies shortness of breath. Gastrointestinal: No abdominal pain.  No nausea, no vomiting.  No diarrhea.  No constipation. Genitourinary: Negative for dysuria. Musculoskeletal: Negative for back pain. Skin: Negative for rash. Neurological: Positive headache, no focal weakness or numbness. All other ROS negative ____________________________________________   PHYSICAL EXAM:  VITAL SIGNS: ED Triage Vitals  Enc Vitals Group     BP 10/13/19 1127 (!) 172/96     Pulse Rate 10/13/19 1127 82     Resp 10/13/19 1127 20     Temp 10/13/19 1127 98.6 F (37 C)     Temp Source 10/13/19 1127 Oral     SpO2 10/13/19 1127 100 %     Weight 10/13/19 1128 135 lb (61.2 kg)     Height 10/13/19 1128 5\' 9"  (1.753 m)     Head Circumference --      Peak Flow --      Pain Score 10/13/19 1138 6     Pain Loc --      Pain Edu? --      Excl. in [place]? --     Constitutional: Alert and oriented. Well appearing and in no acute distress. Eyes: Conjunctivae are normal. EOMI. Head: Atraumatic. Nose: No congestion/rhinnorhea. Mouth/Throat: Mucous membranes are moist.   Neck: No stridor. Trachea Midline.  FROM Cardiovascular: Normal rate, regular rhythm. Grossly normal heart sounds.  Good peripheral circulation.  Equal pulses Respiratory: Normal respiratory effort.  No retractions. Lungs CTAB. Gastrointestinal: Soft and nontender. No distention. No abdominal bruits.  Musculoskeletal: No lower extremity tenderness nor edema.  No joint effusions. Neurologic:  Normal speech and language.  Cranial nerves appear intact.  Equal strength in arms and legs Skin:  Skin is warm, dry and intact. No rash noted. Psychiatric: Mood and affect are normal. Speech and behavior are normal. GU: Deferred   ____________________________________________   LABS (all labs ordered are listed, but only abnormal results are displayed)  Labs Reviewed  BASIC METABOLIC PANEL -  Abnormal; Notable for the following components:      Result Value   Creatinine, Ser 1.51 (*)    GFR calc non Af Amer 48 (*)    GFR calc Af Amer 55 (*)    All other components within normal limits  CBC  POC URINE PREG, ED  TROPONIN I (HIGH SENSITIVITY)   ____________________________________________   ED ECG REPORT I, Concha Se, the attending physician, personally viewed and interpreted this ECG.  EKG is normal sinus rate of 68, no ST elevation, no T wave inversions, normal intervals ____________________________________________  RADIOLOGY Vela Prose, personally viewed and evaluated these images (plain radiographs) as part of my medical decision making, as well as reviewing the written report by the radiologist.  ED MD interpretation: No pneumonia  Official radiology report(s): DG Chest 2 View  Result Date: 10/13/2019 CLINICAL DATA:  Chest pain EXAM: CHEST - 2 VIEW COMPARISON:  06/24/2007 FINDINGS: The heart size and mediastinal contours are within normal limits. Both lungs are clear. The visualized skeletal structures are unremarkable. IMPRESSION: No active cardiopulmonary disease. Electronically Signed   By: Charlett Nose M.D.    On: 10/13/2019 12:43    ____________________________________________   PROCEDURES  Procedure(s) performed (including Critical Care):  Procedures   ____________________________________________   INITIAL IMPRESSION / ASSESSMENT AND PLAN / ED COURSE  Joyce [name] was evaluated in Emergency Department on 10/13/2019 for the symptoms described in the history of present illness. She was evaluated in the context of the global COVID-19 pandemic, which necessitated consideration that the patient might be at risk for infection with the SARS-CoV-2 virus that causes COVID-19. Institutional protocols and algorithms that pertain to the evaluation of patients at risk for COVID-19 are in a state of rapid change based on information released by regulatory bodies including the CDC and federal and state organizations. These policies and algorithms were followed during the patient's care in the ED.    Patient is a well-appearing 24 year old who comes in hypertensive.  Patient had some intermittent chest pain.  Will get EKG cardiac markers to evaluate for ACS and chest x-ray to evaluate for pneumonia, pneumothorax.  Patient currently has no chest pain and all of the chest pain is gone going on intermittently for 2 years therefore I do not think this is a dissection.  No shortness of breath to suggest PE.  Will get labs to evaluate for electrolyte abnormalities, worsening CKD.  For patient's headache patient states that this is her typical headache.  She denies any altered mental status or confusion to suggest press syndrome.  We discussed CT imaging but patient states that this is her normal headache and does not want to get a CT scan at this time.  Low suspicion for intracranial hemorrhage given normal neuro exam.   Kidney function is at 1.51 which is slightly up from her baseline of 1.4 No evidence of anemia No evidence of white count elevation Initial cardiac marker is less than 2 and symptoms have been  going on since yesterday so this rules her out for ACS   We discussed starting patient on hydrochlorothiazide and keeping track of her blood pressures once in the morning once at nighttime.  Patient will follow up with a primary care doctor within 1 to 2 weeks for recheck of blood pressure actually can uptitrate her hydrochlorothiazide.  Patient understand that she should return to the ER she develops worsening chest pain, worsening headaches or any other concerns.  Patient expressed understanding and  felt comfortable with that plan.  I discussed the provisional nature of ED diagnosis, the treatment so far, the ongoing plan of care, follow up appointments and return precautions with the patient and any family or support people present. They expressed understanding and agreed with the plan, discharged home.     ____________________________________________   FINAL CLINICAL IMPRESSION(S) / ED DIAGNOSES   Final diagnoses:  Secondary hypertension      MEDICATIONS GIVEN DURING THIS VISIT:  Medications - No data to display   ED Discharge Orders         Ordered    hydrochlorothiazide (HYDRODIURIL) 12.5 MG tablet  Daily     10/13/19 1703           Note:  This document was prepared using Dragon voice recognition software and may include unintentional dictation errors.   Concha Se, MD 10/13/19 [phone]

## 2019-10-13 NOTE — ED Triage Notes (Signed)
Reports had physical yesterday and had high BP. Pt states that she had headaches, chest heaviness yesterday which continues today. Pt took herself off of her prescribed BP medications due to not liking the way it made her feel. Pt alert and oriented X4, cooperative, RR even and unlabored, color WNL. Pt in NAD.

## 2019-10-13 NOTE — Discharge Instructions (Signed)
Your blood pressure was significantly elevated your kidney function was elevated as well.  Your blood pressure over time to prevent worsening kidney dysfunction.  We are starting you on hydrochlorothiazide.  You should take your blood pressure once in the morning once at nighttime and keep a log.  You should follow-up with your primary care doctor in 1 week for blood pressure recheck to see if it needs uptitrating.

## 2019-10-13 NOTE — ED Notes (Signed)
Pt up to restroom. Steady. Skin dry. Resp: reg/unlabored.

## 2019-10-13 NOTE — ED Notes (Signed)
Pt reports stopped taking prescribed BP med about 3 months ago even after her primary decreased it as she c/o dizziness and "chilled out". Reports it makes her feel like she is going to "pass out". Pt confirms med is amlodipine. Reports HA, dizziness, HTN, and CP at times which is why she was placed on med. Reports 4/10 aching CP currently. Skin remains dry, pt breathing WDL, placed on cardiac monitor.

## 2019-10-17 ENCOUNTER — Ambulatory Visit (INDEPENDENT_AMBULATORY_CARE_PROVIDER_SITE_OTHER): Payer: Medicaid Other

## 2019-10-17 ENCOUNTER — Other Ambulatory Visit: Payer: Self-pay

## 2019-10-17 DIAGNOSIS — Z3042 Encounter for surveillance of injectable contraceptive: Secondary | ICD-10-CM | POA: Diagnosis not present

## 2019-10-17 MED ORDER — MEDROXYPROGESTERONE ACETATE 150 MG/ML IM SUSP
150.0000 mg | Freq: Once | INTRAMUSCULAR | Status: AC
  Administered 2019-10-17: 150 mg via INTRAMUSCULAR

## 2019-10-17 NOTE — Progress Notes (Signed)
Patient presents today for Depo Provera injection within dates. Given IM LUOQ. Patient tolerated well. 

## 2019-11-09 ENCOUNTER — Encounter: Payer: Self-pay | Admitting: Family Medicine

## 2019-11-09 ENCOUNTER — Other Ambulatory Visit: Payer: Self-pay

## 2019-11-09 ENCOUNTER — Telehealth (INDEPENDENT_AMBULATORY_CARE_PROVIDER_SITE_OTHER): Payer: Medicaid Other | Admitting: Family Medicine

## 2019-11-09 VITALS — BP 173/110 | Ht 69.0 in | Wt 135.0 lb

## 2019-11-09 DIAGNOSIS — N1831 Chronic kidney disease, stage 3a: Secondary | ICD-10-CM | POA: Diagnosis not present

## 2019-11-09 DIAGNOSIS — I10 Essential (primary) hypertension: Secondary | ICD-10-CM | POA: Diagnosis not present

## 2019-11-09 MED ORDER — HYDROCHLOROTHIAZIDE 25 MG PO TABS: 25.0000 mg | ORAL_TABLET | Freq: Every day | ORAL | 0 refills | Status: DC

## 2019-11-09 MED ORDER — LISINOPRIL 10 MG PO TABS: 10.0000 mg | ORAL_TABLET | Freq: Every day | ORAL | 0 refills | Status: DC

## 2019-11-09 NOTE — Patient Instructions (Addendum)
I appreciate the opportunity to provide you with care for your health and wellness. Today we discussed: blood pressure and kidney function   Follow up: 4 weeks in office for BP   Labs in morning  No referrals today  Please continue to practice social distancing to keep you, your family, and our community safe.  If you must go out, please wear a mask and practice good handwashing.  It was a pleasure to see you and I look forward to continuing to work together on your health and well-being. Please do not hesitate to call the office if you need care or have questions about your care.  Have a wonderful day and week. With Gratitude, Joyce Beach, DNP, AGNP-BC   DASH Eating Plan DASH stands for "Dietary Approaches to Stop Hypertension." The DASH eating plan is a healthy eating plan that has been shown to reduce high blood pressure (hypertension). It may also reduce your risk for type 2 diabetes, heart disease, and stroke. The DASH eating plan may also help with weight loss. What are tips for following this plan?  General guidelines  Avoid eating more than 2,300 mg (milligrams) of salt (sodium) a day. If you have hypertension, you may need to reduce your sodium intake to 1,500 mg a day.  Limit alcohol intake to no more than 1 drink a day for nonpregnant women and 2 drinks a day for men. One drink equals 12 oz of beer, 5 oz of wine, or 1 oz of hard liquor.  Work with your health care provider to maintain a healthy body weight or to lose weight. Ask what an ideal weight is for you.  Get at least 30 minutes of exercise that causes your heart to beat faster (aerobic exercise) most days of the week. Activities may include walking, swimming, or biking.  Work with your health care provider or diet and nutrition specialist (dietitian) to adjust your eating plan to your individual calorie needs. Reading food labels   Check food labels for the amount of sodium per serving. Choose foods with less  than 5 percent of the Daily Value of sodium. Generally, foods with less than 300 mg of sodium per serving fit into this eating plan.  To find whole grains, look for the word "whole" as the first word in the ingredient list. Shopping  Buy products labeled as "low-sodium" or "no salt added."  Buy fresh foods. Avoid canned foods and premade or frozen meals. Cooking  Avoid adding salt when cooking. Use salt-free seasonings or herbs instead of table salt or sea salt. Check with your health care provider or pharmacist before using salt substitutes.  Do not fry foods. Cook foods using healthy methods such as baking, boiling, grilling, and broiling instead.  Cook with heart-healthy oils, such as olive, canola, soybean, or sunflower oil. Meal planning  Eat a balanced diet that includes: ? 5 or more servings of fruits and vegetables each day. At each meal, try to fill half of your plate with fruits and vegetables. ? Up to 6-8 servings of whole grains each day. ? Less than 6 oz of lean meat, poultry, or fish each day. A 3-oz serving of meat is about the same size as a deck of cards. One egg equals 1 oz. ? 2 servings of low-fat dairy each day. ? A serving of nuts, seeds, or beans 5 times each week. ? Heart-healthy fats. Healthy fats called Omega-3 fatty acids are found in foods such as flaxseeds and coldwater fish,  like sardines, salmon, and mackerel.  Limit how much you eat of the following: ? Canned or prepackaged foods. ? Food that is high in trans fat, such as fried foods. ? Food that is high in saturated fat, such as fatty meat. ? Sweets, desserts, sugary drinks, and other foods with added sugar. ? Full-fat dairy products.  Do not salt foods before eating.  Try to eat at least 2 vegetarian meals each week.  Eat more home-cooked food and less restaurant, buffet, and fast food.  When eating at a restaurant, ask that your food be prepared with less salt or no salt, if possible. What  foods are recommended? The items listed may not be a complete list. Talk with your dietitian about what dietary choices are best for you. Grains Whole-grain or whole-wheat bread. Whole-grain or whole-wheat pasta. Brown rice. [place]. Bulgur. Whole-grain and low-sodium cereals. Pita bread. Low-fat, low-sodium crackers. Whole-wheat flour tortillas. Vegetables Fresh or frozen vegetables (raw, steamed, roasted, or grilled). Low-sodium or reduced-sodium tomato and vegetable juice. Low-sodium or reduced-sodium tomato sauce and tomato paste. Low-sodium or reduced-sodium canned vegetables. Fruits All fresh, dried, or frozen fruit. Canned fruit in natural juice (without added sugar). Meat and other protein foods Skinless chicken or [place]. Ground chicken or [place]. Pork with fat trimmed off. Fish and seafood. Egg whites. Dried beans, peas, or lentils. Unsalted nuts, nut butters, and seeds. Unsalted canned beans. Lean cuts of beef with fat trimmed off. Low-sodium, lean deli meat. Dairy Low-fat (1%) or fat-free (skim) milk. Fat-free, low-fat, or reduced-fat cheeses. Nonfat, low-sodium ricotta or cottage cheese. Low-fat or nonfat yogurt. Low-fat, low-sodium cheese. Fats and oils Soft margarine without trans fats. Vegetable oil. Low-fat, reduced-fat, or light mayonnaise and salad dressings (reduced-sodium). Canola, safflower, olive, soybean, and sunflower oils. Avocado. Seasoning and other foods Herbs. Spices. Seasoning mixes without salt. Unsalted popcorn and pretzels. Fat-free sweets. What foods are not recommended? The items listed may not be a complete list. Talk with your dietitian about what dietary choices are best for you. Grains Baked goods made with fat, such as croissants, muffins, or some breads. Dry pasta or rice meal packs. Vegetables Creamed or fried vegetables. Vegetables in a cheese sauce. Regular canned vegetables (not low-sodium or reduced-sodium). Regular canned tomato sauce and  paste (not low-sodium or reduced-sodium). Regular tomato and vegetable juice (not low-sodium or reduced-sodium). Angie Fava. Olives. Fruits Canned fruit in a light or heavy syrup. Fried fruit. Fruit in cream or butter sauce. Meat and other protein foods Fatty cuts of meat. Ribs. Fried meat. Berniece Salines. Sausage. Bologna and other processed lunch meats. Salami. Fatback. Hotdogs. Bratwurst. Salted nuts and seeds. Canned beans with added salt. Canned or smoked fish. Whole eggs or egg yolks. Chicken or [place] with skin. Dairy Whole or 2% milk, cream, and half-and-half. Whole or full-fat cream cheese. Whole-fat or sweetened yogurt. Full-fat cheese. Nondairy creamers. Whipped toppings. Processed cheese and cheese spreads. Fats and oils Butter. Stick margarine. Lard. Shortening. Ghee. Bacon fat. Tropical oils, such as coconut, palm kernel, or palm oil. Seasoning and other foods Salted popcorn and pretzels. Onion salt, garlic salt, seasoned salt, table salt, and sea salt. Worcestershire sauce. Tartar sauce. Barbecue sauce. Teriyaki sauce. Soy sauce, including reduced-sodium. Steak sauce. Canned and packaged gravies. Fish sauce. Oyster sauce. Cocktail sauce. Horseradish that you find on the shelf. Ketchup. Mustard. Meat flavorings and tenderizers. Bouillon cubes. Hot sauce and Tabasco sauce. Premade or packaged marinades. Premade or packaged taco seasonings. Relishes. Regular salad dressings. Where to find more information:  National Heart, Lung, and Blood Institute: PopSteam.is  American Heart Association: www.heart.org Summary  The DASH eating plan is a healthy eating plan that has been shown to reduce high blood pressure (hypertension). It may also reduce your risk for type 2 diabetes, heart disease, and stroke.  With the DASH eating plan, you should limit salt (sodium) intake to 2,300 mg a day. If you have hypertension, you may need to reduce your sodium intake to 1,500 mg a day.  When on the DASH  eating plan, aim to eat more fresh fruits and vegetables, whole grains, lean proteins, low-fat dairy, and heart-healthy fats.  Work with your health care provider or diet and nutrition specialist (dietitian) to adjust your eating plan to your individual calorie needs. This information is not intended to replace advice given to you by your health care provider. Make sure you discuss any questions you have with your health care provider. Document Revised: 05/29/2017 Document Reviewed: 06/09/2016 Elsevier Patient Education  2020 [employer].

## 2019-11-09 NOTE — Assessment & Plan Note (Signed)
Most recent lab levels demonstrated a GFR of 55%.  Will be following up on this.  Education today on the impact of blood pressure and the need to control it to prevent worsening kidney function.   Patient acknowledged agreement and understanding of the plan.

## 2019-11-09 NOTE — Progress Notes (Signed)
Virtual Visit via Telephone Note   This visit type was conducted due to national recommendations for restrictions regarding the COVID-19 Pandemic (e.g. social distancing) in an effort to limit this patient's exposure and mitigate transmission in our community.  Due to her co-morbid illnesses, this patient is at least at moderate risk for complications without adequate follow up.  This format is felt to be most appropriate for this patient at this time.  The patient did not have access to video technology/had technical difficulties with video requiring transitioning to audio format only (telephone).  All issues noted in this document were discussed and addressed.  No physical exam could be performed with this format.    Evaluation Performed:  Follow-up visit  Date:  11/09/2019   ID:  Joyce [name], Joyce [name] [birth year], MRN [id number]  Patient Location: Home Provider Location: Office  Location of Patient: Home Location of Provider: Telehealth Consent was obtain for visit to be over via telehealth. I verified that I am speaking with the correct person using two identifiers.  PCP:  Freddy Finner, NP   Chief Complaint: Elevated blood pressure  History of Present Illness:    Joyce [name] is a 24 y.o. female with renal dysfunction, high blood pressure, among others.  Presents today because she is having elevated blood pressure. Reports it is 173/110 today even in the medication that the ER put her on. She reports stopping the Norvasc I had her on because she thought we was better. She was not having signs or symptoms of elevated BP.  But reports daily headaches.  She does not think that the hydrochlorothiazide is doing much good.  She is off the other medication she was on before hand the Norvasc was better.  She has a strong family history of having high blood pressure and most of her family members are on blood pressure medicine.  She reports being very concerned that she might end up on  dialysis.  Of note she did have a CT scan in 2010 there were no noted cystic changes in the kidney.  The patient does not have symptoms concerning for COVID-19 infection (fever, chills, cough, or new shortness of breath).   Past Medical, Surgical, Social History, Allergies, and Medications have been Reviewed.  Past Medical History:  Diagnosis Date  . Allergy    seasonal  . Anxiety   . Elevated blood-pressure reading without diagnosis of hypertension 12/26/2014  . Elevated serum creatinine 12/26/2014  . Gestational hypertension 02/12/2015  . H/O acute renal failure 12/26/2014  . Heart murmur    was told that she had beginning stages  . Migraine   . Renal dysfunction   . Renal dysfunction 12/26/2014  . S/P thyroid surgery 12/26/2014  . Severe preeclampsia   . Short interval between pregnancies complicating pregnancy in third trimester, antepartum   . Substance abuse (HCC)    uses marijuanna  . Thyroid disease    right side removed   Past Surgical History:  Procedure Laterality Date  . THYROID SURGERY       Current Meds  Medication Sig  . hydrochlorothiazide (HYDRODIURIL) 25 MG tablet Take 1 tablet (25 mg total) by mouth daily.  . [DISCONTINUED] hydrochlorothiazide (HYDRODIURIL) 12.5 MG tablet Take 1 tablet (12.5 mg total) by mouth daily.     Allergies:   Patient has no known allergies.   ROS:   Please see the history of present illness.    All other systems reviewed and are negative.  Labs/Other Tests and Data Reviewed:    Recent Labs: 01/05/2019: ALT 12; TSH 1.25 10/13/2019: BUN 16; Creatinine, Ser 1.51; Hemoglobin 14.2; Platelets 185; Potassium 4.1; Sodium 137   Recent Lipid Panel No results found for: CHOL, TRIG, HDL, CHOLHDL, LDLCALC, LDLDIRECT  Wt Readings from Last 3 Encounters:  11/09/19 135 lb (61.2 kg)  10/13/19 135 lb (61.2 kg)  03/16/19 130 lb 0.6 oz (59 kg)     Objective:    Vital Signs:  BP (!) 173/110   Ht 5\' 9"  (1.753 m)   Wt 135 lb (61.2 kg)    BMI 19.94 kg/m    VITAL SIGNS:  reviewed GEN:  Alert and oriented RESPIRATORY:  No shortness of breath noted in conversation PSYCH:  Normal affect, anxious mood  ASSESSMENT & PLAN:    1. Essential hypertension  - COMPLETE METABOLIC PANEL WITH GFR - CBC - lisinopril (ZESTRIL) 10 MG tablet; Take 1 tablet (10 mg total) by mouth daily.  Dispense: 30 tablet; Refill: 0 - hydrochlorothiazide (HYDRODIURIL) 25 MG tablet; Take 1 tablet (25 mg total) by mouth daily.  Dispense: 30 tablet; Refill: 0  2. Stage 3a chronic kidney disease     Time:   Today, I have spent 15 minutes with the patient with telehealth technology discussing the above problems.     Medication Adjustments/Labs and Tests Ordered: Current medicines are reviewed at length with the patient today.  Concerns regarding medicines are outlined above.   Tests Ordered: Orders Placed This Encounter  Procedures  . COMPLETE METABOLIC PANEL WITH GFR  . CBC    Medication Changes: Meds ordered this encounter  Medications  . lisinopril (ZESTRIL) 10 MG tablet    Sig: Take 1 tablet (10 mg total) by mouth daily.    Dispense:  30 tablet    Refill:  0    Order Specific Question:   Supervising Provider    Answer:   SIMPSON, MARGARET E [[id number]]  . hydrochlorothiazide (HYDRODIURIL) 25 MG tablet    Sig: Take 1 tablet (25 mg total) by mouth daily.    Dispense:  30 tablet    Refill:  0    Order Specific Question:   Supervising Provider    Answer:   Fayrene Helper [[id number]]    Disposition:  Follow up 4 weeks Signed, Perlie Mayo, NP  11/09/2019 10:55 AM     [place]

## 2019-11-09 NOTE — Assessment & Plan Note (Signed)
At last visit back in December she was advised to continue Norvasc.  Encouraged on a low-salt diet and exercise.  She reports that she tried to change her diet but has not been the best about this.  Additionally she does not think that the hydrochlorothiazide is doing much for her blood pressure at this time because she considers her blood pressure to be elevated on most days now and she started to have headaches more consistently.  We will increase her hydrochlorothiazide to 25 mg and start her on lisinopril 10 mg.  Additionally will be getting follow-up lab work to check and make sure that her kidney function is stable.   Reviewed side effects, risks and benefits of medication.   Patient acknowledged agreement and understanding of the plan.

## 2019-11-10 DIAGNOSIS — I10 Essential (primary) hypertension: Secondary | ICD-10-CM | POA: Diagnosis not present

## 2019-11-10 LAB — CBC
HCT: 47.2 % — ABNORMAL HIGH (ref 35.0–45.0)
Hemoglobin: 15.2 g/dL (ref 11.7–15.5)
MCH: 28 pg (ref 27.0–33.0)
MCHC: 32.2 g/dL (ref 32.0–36.0)
MCV: 86.9 fL (ref 80.0–100.0)
MPV: 10.9 fL (ref 7.5–12.5)
Platelets: 212 10*3/uL (ref 140–400)
RBC: 5.43 10*6/uL — ABNORMAL HIGH (ref 3.80–5.10)
RDW: 11.8 % (ref 11.0–15.0)
WBC: 5.7 10*3/uL (ref 3.8–10.8)

## 2019-11-10 LAB — COMPLETE METABOLIC PANEL WITH GFR
AG Ratio: 2 (calc) (ref 1.0–2.5)
ALT: 11 U/L (ref 6–29)
AST: 18 U/L (ref 10–30)
Albumin: 4.5 g/dL (ref 3.6–5.1)
Alkaline phosphatase (APISO): 99 U/L (ref 31–125)
BUN/Creatinine Ratio: 16 (calc) (ref 6–22)
BUN: 23 mg/dL (ref 7–25)
CO2: 25 mmol/L (ref 20–32)
Calcium: 9.2 mg/dL (ref 8.6–10.2)
Chloride: 102 mmol/L (ref 98–110)
Creat: 1.44 mg/dL — ABNORMAL HIGH (ref 0.50–1.10)
GFR, Est African American: 59 mL/min/{1.73_m2} — ABNORMAL LOW (ref >=60)
GFR, Est Non African American: 51 mL/min/{1.73_m2} — ABNORMAL LOW (ref >=60)
Globulin: 2.2 g/dL (calc) (ref 1.9–3.7)
Glucose, Bld: 99 mg/dL (ref 65–99)
Potassium: 3.7 mmol/L (ref 3.5–5.3)
Sodium: 136 mmol/L (ref 135–146)
Total Bilirubin: 0.5 mg/dL (ref 0.2–1.2)
Total Protein: 6.7 g/dL (ref 6.1–8.1)

## 2019-11-14 ENCOUNTER — Ambulatory Visit: Payer: Medicaid Other | Admitting: Obstetrics and Gynecology

## 2019-11-14 NOTE — Progress Notes (Deleted)
PCP:  Freddy Finner, NP   No chief complaint on file.    HPI:      [name] is a 24 y.o. [id number] who LMP was No LMP recorded. Patient has had an injection., presents today for her annual examination.  Her menses are irregular bleeding/spotting since starting depo 04/15/18. Just got 2nd shot 08/13/18. Having some mood changes, decreased sex drive. Would like to give it more time, however. Had nexplanon removed 9/19, not sure if she would want IUD. No other options interest her.  Sex activity: single partner, contraception - Depo-Provera injections.  Last Pap: 11/01/18,  Results: no abnormalities Hx of STDs: none  There is no FH of breast cancer. There is no FH of ovarian cancer. The patient does do self-breast exams.  Tobacco use: The patient denies current or previous tobacco use. Alcohol use: none No drug use.  Exercise: moderately active  She does get adequate calcium and Vitamin D in her diet.   Past Medical History:  Diagnosis Date  . Allergy    seasonal  . Anxiety   . Elevated blood-pressure reading without diagnosis of hypertension 12/26/2014  . Elevated serum creatinine 12/26/2014  . Gestational hypertension 02/12/2015  . H/O acute renal failure 12/26/2014  . Heart murmur    was told that she had beginning stages  . Migraine   . Post-viral cough syndrome 06/20/2019  . Renal dysfunction   . Renal dysfunction 12/26/2014  . S/P thyroid surgery 12/26/2014  . Severe preeclampsia   . Short interval between pregnancies complicating pregnancy in third trimester, antepartum   . Substance abuse (HCC)    uses marijuanna  . Thyroid disease    right side removed    Past Surgical History:  Procedure Laterality Date  . THYROID SURGERY      Family History  Problem Relation Age of Onset  . Hypertension Mother   . Birth defects Paternal Grandmother   . Hypertension Maternal Grandmother     Social History   Socioeconomic History  . Marital status: Single     Spouse name: Not on file  . Number of children: 2  . Years of education: Not on file  . Highest education level: High school graduate  Occupational History  . Not on file  Tobacco Use  . Smoking status: Former Smoker    Types: Cigarettes  . Smokeless tobacco: Never Used  Substance and Sexual Activity  . Alcohol use: Yes    Comment: occasion  . Drug use: Yes    Frequency: 1.0 times per week    Types: Marijuana  . Sexual activity: Yes    Birth control/protection: Injection  Other Topics Concern  . Not on file  Social History Narrative   Lives with two kids   Auberi: 5 (full time)    Jaxon: 4 (half and half) [place]    Fathers are in the lives, help take care of children      Enjoys: playing with kids, going out to eat, movies and music      Diet: Does not feel like she eats as good as she should. Does eat veggies and fruits; will drink v8   Caffeine: pepis -4-5 cans a day, has slowed down    Water: gallon daily      Wear seat belt   Does not wear sunscreen   Smoke and carbon monoxide detectors   Does not use phone while driving, unless it is a call.    Social  Determinants of Health   Financial Resource Strain: Low Risk   . Difficulty of Paying Living Expenses: Not hard at all  Food Insecurity: No Food Insecurity  . Worried About Charity fundraiser in the Last Year: Never true  . Ran Out of Food in the Last Year: Never true  Transportation Needs: No Transportation Needs  . Lack of Transportation (Medical): No  . Lack of Transportation (Non-Medical): No  Physical Activity: Insufficiently Active  . Days of Exercise per Week: 2 days  . Minutes of Exercise per Session: 30 min  Stress: Stress Concern Present  . Feeling of Stress : Very much  Social Connections: Moderately Isolated  . Frequency of Communication with Friends and Family: More than three times a week  . Frequency of Social Gatherings with Friends and Family: Once a week  . Attends Religious Services: Never  .  Active Member of Clubs or Organizations: No  . Attends Archivist Meetings: Never  . Marital Status: Never married  Intimate Partner Violence: Not At Risk  . Fear of Current or Ex-Partner: No  . Emotionally Abused: No  . Physically Abused: No  . Sexually Abused: No    Outpatient Medications Prior to Visit  Medication Sig Dispense Refill  . hydrochlorothiazide (HYDRODIURIL) 25 MG tablet Take 1 tablet (25 mg total) by mouth daily. 30 tablet 0  . lisinopril (ZESTRIL) 10 MG tablet Take 1 tablet (10 mg total) by mouth daily. 30 tablet 0   No facility-administered medications prior to visit.      ROS:  Review of Systems  Constitutional: Positive for fatigue. Negative for fever and unexpected weight change.  Respiratory: Negative for cough, shortness of breath and wheezing.   Cardiovascular: Negative for chest pain, palpitations and leg swelling.  Gastrointestinal: Negative for blood in stool, constipation, diarrhea, nausea and vomiting.  Endocrine: Negative for cold intolerance, heat intolerance and polyuria.  Genitourinary: Positive for dyspareunia and vaginal bleeding. Negative for dysuria, flank pain, frequency, genital sores, hematuria, menstrual problem, pelvic pain, urgency, vaginal discharge and vaginal pain.  Musculoskeletal: Negative for back pain, joint swelling and myalgias.  Skin: Negative for rash.  Neurological: Positive for headaches. Negative for dizziness, syncope, light-headedness and numbness.  Hematological: Negative for adenopathy.  Psychiatric/Behavioral: Negative for agitation, confusion, sleep disturbance and suicidal ideas. The patient is not nervous/anxious.    BREAST: No symptoms   Objective: There were no vitals taken for this visit.   Physical Exam Constitutional:      Appearance: She is well-developed.  Genitourinary:     Vulva, vagina, cervix, uterus, right adnexa and left adnexa normal.     No vulval lesion or tenderness noted.      No vaginal discharge, erythema or tenderness.     No cervical polyp.     Uterus is not enlarged or tender.     No right or left adnexal mass present.     Right adnexa not tender.     Left adnexa not tender.  Neck:     Thyroid: No thyromegaly.  Cardiovascular:     Rate and Rhythm: Normal rate and regular rhythm.     Heart sounds: Normal heart sounds. No murmur.  Pulmonary:     Effort: Pulmonary effort is normal.     Breath sounds: Normal breath sounds.  Chest:     Breasts:        Right: No mass, nipple discharge, skin change or tenderness.        Left: No  mass, nipple discharge, skin change or tenderness.  Abdominal:     Palpations: Abdomen is soft.     Tenderness: There is no abdominal tenderness. There is no guarding.  Musculoskeletal:        General: Normal range of motion.     Cervical back: Normal range of motion.  Neurological:     General: No focal deficit present.     Mental Status: She is alert and oriented to person, place, and time.     Cranial Nerves: No cranial nerve deficit.  Skin:    General: Skin is warm and dry.  Psychiatric:        Mood and Affect: Mood normal.        Behavior: Behavior normal.        Thought Content: Thought content normal.        Judgment: Judgment normal.  Vitals reviewed.     Assessment/Plan: No diagnosis found.  No orders of the defined types were placed in this encounter.            GYN counsel family planning choices, adequate intake of calcium and vitamin D, diet and exercise     F/U  No follow-ups on file.  Elijiah Mickley B. Quince Santana, PA-C 11/14/2019 10:09 AM

## 2019-12-06 ENCOUNTER — Ambulatory Visit (INDEPENDENT_AMBULATORY_CARE_PROVIDER_SITE_OTHER): Payer: Medicaid Other | Admitting: Family Medicine

## 2019-12-06 ENCOUNTER — Other Ambulatory Visit: Payer: Self-pay

## 2019-12-06 ENCOUNTER — Encounter: Payer: Self-pay | Admitting: Family Medicine

## 2019-12-06 VITALS — BP 140/80 | HR 76 | Temp 98.6°F | Ht 69.0 in | Wt 147.0 lb

## 2019-12-06 DIAGNOSIS — N1831 Chronic kidney disease, stage 3a: Secondary | ICD-10-CM

## 2019-12-06 DIAGNOSIS — I10 Essential (primary) hypertension: Secondary | ICD-10-CM

## 2019-12-06 MED ORDER — LISINOPRIL 10 MG PO TABS: 10.0000 mg | ORAL_TABLET | Freq: Every day | ORAL | 0 refills | Status: DC

## 2019-12-06 MED ORDER — HYDROCHLOROTHIAZIDE 25 MG PO TABS: 25.0000 mg | ORAL_TABLET | Freq: Every day | ORAL | 0 refills | Status: DC

## 2019-12-06 NOTE — Progress Notes (Signed)
Subjective:  Patient ID: Joyce [name], female    DOB: August 18, 1995  Age: 24 y.o. MRN: [id number]  CC:  Chief Complaint  Patient presents with  . Hypertension    4 week follow up       HPI  HPI   Joyce [name] is a 24 year old female patient of mine who presents today for follow-up on high blood pressure. History of renal dysfunction, high blood pressure among others.  Her last visit in May she went to the emergency room secondary to having high blood pressure.  She had stopped taking her Norvasc as she thought she was better.  She is not having any signs of elevated blood pressure but they reported headaches daily. She did not think that the hydrochlorothiazide was doing much for her.  She is off the other medication she was on before hand the Norvasc was better to her for this.  She has a strong family history of high blood pressure most of her family maternal blood pressure medicine.  She has a blood pressure cuff for the wrist at home.  Reports that her home blood pressures have ranged from 120 -130/80's.  She is working to avoid salt.  Is working 2 jobs so she is very active with that.  But does not actually have a specific workout routine.  He additionally has been able to put on some weight and is eating better and not as stressed as she was.  Cardiac symptoms: none and tighness in chest when BP is elevated, Left arm achying at times.. Patient denies: chest pain, chest pressure/discomfort, claudication, dyspnea, exertional chest pressure/discomfort, fatigue, irregular heart beat, lower extremity edema, near-syncope, orthopnea, palpitations, paroxysmal nocturnal dyspnea, syncope and tachypnea. Reports taking all medications as directed and denies side effects.  Today patient denies signs and symptoms of COVID 19 infection including fever, chills, cough, shortness of breath, and headache. Past Medical, Surgical, Social History, Allergies, and Medications have been Reviewed.   Past  Medical History:  Diagnosis Date  . Allergy    seasonal  . Anxiety   . Elevated blood-pressure reading without diagnosis of hypertension 12/26/2014  . Elevated serum creatinine 12/26/2014  . Gestational hypertension 02/12/2015  . H/O acute renal failure 12/26/2014  . Heart murmur    was told that she had beginning stages  . Migraine   . Post-viral cough syndrome 06/20/2019  . Renal dysfunction   . Renal dysfunction 12/26/2014  . S/P thyroid surgery 12/26/2014  . Severe preeclampsia   . Short interval between pregnancies complicating pregnancy in third trimester, antepartum   . Substance abuse (HCC)    uses marijuanna  . Thyroid disease    right side removed    Current Meds  Medication Sig  . hydrochlorothiazide (HYDRODIURIL) 25 MG tablet Take 1 tablet (25 mg total) by mouth daily.  Marland Kitchen lisinopril (ZESTRIL) 10 MG tablet Take 1 tablet (10 mg total) by mouth daily.  . [DISCONTINUED] hydrochlorothiazide (HYDRODIURIL) 25 MG tablet Take 1 tablet (25 mg total) by mouth daily.  . [DISCONTINUED] lisinopril (ZESTRIL) 10 MG tablet Take 1 tablet (10 mg total) by mouth daily.    ROS:  Review of Systems  Constitutional: Negative.   HENT: Negative.   Eyes: Negative.   Respiratory: Negative.   Cardiovascular: Negative.   Gastrointestinal: Negative.   Genitourinary: Negative.   Musculoskeletal: Negative.   Skin: Negative.   Neurological: Negative.   Endo/Heme/Allergies: Negative.   Psychiatric/Behavioral: Negative.   All other systems reviewed and are negative.  Objective:   Today's Vitals: BP 140/80   Pulse 76   Temp 98.6 F (37 C)   Ht 5\' 9"  (1.753 m)   Wt 147 lb (66.7 kg)   SpO2 99%   BMI 21.71 kg/m  Vitals with BMI 12/06/2019 12/06/2019 11/09/2019  Height - 5\' 9"  5\' 9"   Weight - 147 lbs 135 lbs  BMI - 97.3 53.29  Systolic 924 268 341  Diastolic 80 98 962  Pulse - 76 -     Physical Exam Vitals and nursing note reviewed.  Constitutional:      Appearance: Normal  appearance. She is well-developed, well-groomed and normal weight.  HENT:     Head: Normocephalic and atraumatic.     Right Ear: External ear normal.     Left Ear: External ear normal.     Mouth/Throat:     Comments: Mask in place  Eyes:     General:        Right eye: No discharge.        Left eye: No discharge.     Conjunctiva/sclera: Conjunctivae normal.  Cardiovascular:     Rate and Rhythm: Normal rate and regular rhythm.     Pulses: Normal pulses.     Heart sounds: Normal heart sounds.  Pulmonary:     Effort: Pulmonary effort is normal.     Breath sounds: Normal breath sounds.  Musculoskeletal:        General: Normal range of motion.     Cervical back: Normal range of motion and neck supple.  Skin:    General: Skin is warm.  Neurological:     General: No focal deficit present.     Mental Status: She is alert and oriented to person, place, and time.  Psychiatric:        Attention and Perception: Attention normal.        Mood and Affect: Mood normal.        Speech: Speech normal.        Behavior: Behavior normal. Behavior is cooperative.        Thought Content: Thought content normal.        Cognition and Memory: Cognition normal.        Judgment: Judgment normal.     Assessment   1. Essential hypertension   2. Stage 3a chronic kidney disease     Tests ordered Orders Placed This Encounter  Procedures  . COMPLETE METABOLIC PANEL WITH GFR  . CBC     Plan: Please see assessment and plan per problem list above.   Meds ordered this encounter  Medications  . hydrochlorothiazide (HYDRODIURIL) 25 MG tablet    Sig: Take 1 tablet (25 mg total) by mouth daily.    Dispense:  90 tablet    Refill:  0    Order Specific Question:   Supervising Provider    Answer:   SIMPSON, MARGARET E [[id number]]  . lisinopril (ZESTRIL) 10 MG tablet    Sig: Take 1 tablet (10 mg total) by mouth daily.    Dispense:  90 tablet    Refill:  0    Order Specific Question:   Supervising  Provider    Answer:   Jacklynn Bue    Patient to follow-up in 01/10/2020   Perlie Mayo, NP

## 2019-12-06 NOTE — Patient Instructions (Addendum)
I appreciate the opportunity to provide you with care for your health and wellness. Today we discussed: BP   Follow up: 3 months in office for BP check   Labs before next appt-at least 3-5 days prior No referrals today  GOAL:  120-130/60-80's If getting higher readings please call or message me so we can adjust your medications.  Keep working on avoiding salt Increasing activity as you can Eating a well balanced diet and hydrating with water  Please continue to practice social distancing to keep you, your family, and our community safe.  If you must go out, please wear a mask and practice good handwashing.  It was a pleasure to see you and I look forward to continuing to work together on your health and well-being. Please do not hesitate to call the office if you need care or have questions about your care.  Have a wonderful day and week. With Gratitude, Tereasa Coop, DNP, AGNP-BC

## 2019-12-06 NOTE — Assessment & Plan Note (Signed)
I have continued her hydrochlorothiazide at 25 mg, her lisinopril at 10 mg.  Her blood pressures that she reports that she gets at home are in normal range.  I have advised for her to follow back up with her kidney function and lab work within the next week.  Pending what these say we will look at increasing her lisinopril if needed.  I have encouraged her to focus on a DASH diet and to work on getting exercise on a daily basis outside of work.  Patient acknowledged agreement and understanding of the plan.

## 2019-12-06 NOTE — Assessment & Plan Note (Signed)
Ordering updated labs to follow GFR trend.  Encouraged continued medication compliance and lisinopril and hydrochlorothiazide as directed.  I have asked the office to contact the nephrology office to see if we can get an appointment set up for her.  Referral was placed last month.

## 2019-12-07 ENCOUNTER — Ambulatory Visit: Payer: Medicaid Other | Admitting: Family Medicine

## 2020-01-03 ENCOUNTER — Other Ambulatory Visit: Payer: Self-pay | Admitting: Family Medicine

## 2020-01-03 DIAGNOSIS — I10 Essential (primary) hypertension: Secondary | ICD-10-CM

## 2020-01-03 MED ORDER — HYDROCHLOROTHIAZIDE 25 MG PO TABS: 25.0000 mg | ORAL_TABLET | Freq: Every day | ORAL | 0 refills | Status: DC

## 2020-01-03 MED ORDER — LISINOPRIL 10 MG PO TABS: 10.0000 mg | ORAL_TABLET | Freq: Every day | ORAL | 0 refills | Status: DC

## 2020-01-10 ENCOUNTER — Encounter: Payer: Medicaid Other | Admitting: Family Medicine

## 2020-01-11 ENCOUNTER — Other Ambulatory Visit: Payer: Self-pay | Admitting: Obstetrics and Gynecology

## 2020-01-11 ENCOUNTER — Telehealth: Payer: Self-pay | Admitting: Obstetrics and Gynecology

## 2020-01-11 DIAGNOSIS — Z3042 Encounter for surveillance of injectable contraceptive: Secondary | ICD-10-CM

## 2020-01-11 MED ORDER — MEDROXYPROGESTERONE ACETATE 150 MG/ML IM SUSY: 150.0000 mg | PREFILLED_SYRINGE | INTRAMUSCULAR | 0 refills | Status: DC

## 2020-01-11 NOTE — Telephone Encounter (Signed)
I can't refill it for some reason. Can you pls help?

## 2020-01-11 NOTE — Progress Notes (Signed)
Rx RF depo till next annual.

## 2020-01-11 NOTE — Telephone Encounter (Signed)
Rx eRxd.  

## 2020-01-11 NOTE — Telephone Encounter (Signed)
Pt scheduled for annual, please send in deop she has appointment Friday to get it

## 2020-01-13 ENCOUNTER — Ambulatory Visit: Payer: Medicaid Other

## 2020-01-13 ENCOUNTER — Other Ambulatory Visit: Payer: Self-pay | Admitting: Obstetrics and Gynecology

## 2020-01-13 DIAGNOSIS — Z3042 Encounter for surveillance of injectable contraceptive: Secondary | ICD-10-CM

## 2020-01-16 ENCOUNTER — Ambulatory Visit (INDEPENDENT_AMBULATORY_CARE_PROVIDER_SITE_OTHER): Payer: Self-pay

## 2020-01-16 ENCOUNTER — Other Ambulatory Visit: Payer: Self-pay

## 2020-01-16 DIAGNOSIS — Z3042 Encounter for surveillance of injectable contraceptive: Secondary | ICD-10-CM

## 2020-01-16 MED ORDER — MEDROXYPROGESTERONE ACETATE 150 MG/ML IM SUSP
150.0000 mg | Freq: Once | INTRAMUSCULAR | Status: AC
  Administered 2020-01-16: 150 mg via INTRAMUSCULAR

## 2020-01-16 NOTE — Progress Notes (Signed)
Patient presents today for Depo Provera injection within dates. Given IM RUOQ. Patient tolerated well. 

## 2020-02-07 ENCOUNTER — Ambulatory Visit (INDEPENDENT_AMBULATORY_CARE_PROVIDER_SITE_OTHER): Payer: Medicaid Other | Admitting: Obstetrics and Gynecology

## 2020-02-07 ENCOUNTER — Other Ambulatory Visit: Payer: Self-pay

## 2020-02-07 ENCOUNTER — Other Ambulatory Visit (HOSPITAL_COMMUNITY)
Admission: RE | Admit: 2020-02-07 | Discharge: 2020-02-07 | Disposition: A | Discharge status: Home / Self Care | Payer: Medicaid Other | Source: Ambulatory Visit | Attending: Obstetrics and Gynecology | Admitting: Obstetrics and Gynecology

## 2020-02-07 ENCOUNTER — Encounter: Payer: Self-pay | Admitting: Obstetrics and Gynecology

## 2020-02-07 VITALS — BP 124/86 | Ht 68.0 in | Wt 137.0 lb

## 2020-02-07 DIAGNOSIS — Z113 Encounter for screening for infections with a predominantly sexual mode of transmission: Secondary | ICD-10-CM | POA: Diagnosis not present

## 2020-02-07 DIAGNOSIS — Z01419 Encounter for gynecological examination (general) (routine) without abnormal findings: Secondary | ICD-10-CM | POA: Diagnosis not present

## 2020-02-07 DIAGNOSIS — Z3042 Encounter for surveillance of injectable contraceptive: Secondary | ICD-10-CM

## 2020-02-07 MED ORDER — MEDROXYPROGESTERONE ACETATE 150 MG/ML IM SUSY: 150.0000 mg | PREFILLED_SYRINGE | INTRAMUSCULAR | 3 refills | Status: DC

## 2020-02-07 NOTE — Progress Notes (Signed)
PCP:  Freddy Finner, NP   Chief Complaint  Patient presents with  . Annual Exam     HPI:      [name] is a 24 y.o. [id number] who LMP was No LMP recorded. Patient has had an injection., presents today for her annual examination.  Her menses are absent with depo, rare BTB, no dysmen. Mood changes improved, wants to cont.  Had nexplanon removed 9/19, not sure if she would want IUD. No other options interest her.  Sex activity: single partner, contraception - Depo-Provera injections. Hx of HTN, followed by PCP Last Pap: 11/11/18 Results: no abnormalities Hx of STDs: none  There is no FH of breast cancer. There is no FH of ovarian cancer. The patient does do self-breast exams.  Tobacco use: The patient denies current or previous tobacco use. Alcohol use: none No drug use.  Exercise: moderately active  She does get adequate calcium but not Vitamin D in her diet.   Past Medical History:  Diagnosis Date  . Allergy    seasonal  . Anxiety   . Elevated blood-pressure reading without diagnosis of hypertension 12/26/2014  . Elevated serum creatinine 12/26/2014  . Gestational hypertension 02/12/2015  . H/O acute renal failure 12/26/2014  . Heart murmur    was told that she had beginning stages  . Migraine   . Post-viral cough syndrome 06/20/2019  . Renal dysfunction   . Renal dysfunction 12/26/2014  . S/P thyroid surgery 12/26/2014  . Severe preeclampsia   . Short interval between pregnancies complicating pregnancy in third trimester, antepartum   . Substance abuse (HCC)    uses marijuanna  . Thyroid disease    right side removed    Past Surgical History:  Procedure Laterality Date  . THYROID SURGERY      Family History  Problem Relation Age of Onset  . Hypertension Mother   . Birth defects Paternal Grandmother   . Hypertension Maternal Grandmother     Social History   Socioeconomic History  . Marital status: Single    Spouse name: Not on file  . Number  of children: 2  . Years of education: Not on file  . Highest education level: High school graduate  Occupational History  . Not on file  Tobacco Use  . Smoking status: Former Smoker    Types: Cigarettes  . Smokeless tobacco: Never Used  Vaping Use  . Vaping Use: Never used  Substance and Sexual Activity  . Alcohol use: Yes    Comment: occasion  . Drug use: Yes    Frequency: 1.0 times per week    Types: Marijuana  . Sexual activity: Yes    Birth control/protection: Injection  Other Topics Concern  . Not on file  Social History Narrative   Lives with two kids   Auberi: 5 (full time)    Jaxon: 4 (half and half) [place]    Fathers are in the lives, help take care of children      Enjoys: playing with kids, going out to eat, movies and music      Diet: Does not feel like she eats as good as she should. Does eat veggies and fruits; will drink v8   Caffeine: pepis -4-5 cans a day, has slowed down    Water: gallon daily      Wear seat belt   Does not wear sunscreen   Smoke and carbon monoxide detectors   Does not use phone while driving, unless  it is a call.    Social Determinants of Health   Financial Resource Strain:   . Difficulty of Paying Living Expenses:   Food Insecurity:   . Worried About Programme researcher, broadcasting/film/video in the Last Year:   . Barista in the Last Year:   Transportation Needs:   . Freight forwarder (Medical):   Marland Kitchen Lack of Transportation (Non-Medical):   Physical Activity:   . Days of Exercise per Week:   . Minutes of Exercise per Session:   Stress:   . Feeling of Stress :   Social Connections:   . Frequency of Communication with Friends and Family:   . Frequency of Social Gatherings with Friends and Family:   . Attends Religious Services:   . Active Member of Clubs or Organizations:   . Attends Banker Meetings:   Marland Kitchen Marital Status:   Intimate Partner Violence:   . Fear of Current or Ex-Partner:   . Emotionally Abused:   Marland Kitchen Physically  Abused:   . Sexually Abused:     Outpatient Medications Prior to Visit  Medication Sig Dispense Refill  . hydrochlorothiazide (HYDRODIURIL) 25 MG tablet Take 1 tablet (25 mg total) by mouth daily. 90 tablet 0  . lisinopril (ZESTRIL) 10 MG tablet Take 1 tablet (10 mg total) by mouth daily. 90 tablet 0  . medroxyPROGESTERone Acetate 150 MG/ML SUSY Inject 1 mL (150 mg total) into the muscle every 3 (three) months. 1 mL 0   No facility-administered medications prior to visit.      ROS:  Review of Systems  Constitutional: Negative for fatigue, fever and unexpected weight change.  Respiratory: Negative for cough, shortness of breath and wheezing.   Cardiovascular: Negative for chest pain, palpitations and leg swelling.  Gastrointestinal: Negative for blood in stool, constipation, diarrhea, nausea and vomiting.  Endocrine: Negative for cold intolerance, heat intolerance and polyuria.  Genitourinary: Negative for dyspareunia, dysuria, flank pain, frequency, genital sores, hematuria, menstrual problem, pelvic pain, urgency, vaginal bleeding, vaginal discharge and vaginal pain.  Musculoskeletal: Negative for back pain, joint swelling and myalgias.  Skin: Negative for rash.  Neurological: Negative for dizziness, syncope, light-headedness, numbness and headaches.  Hematological: Negative for adenopathy.  Psychiatric/Behavioral: Negative for agitation, confusion, sleep disturbance and suicidal ideas. The patient is not nervous/anxious.   BREAST: No symptoms   Objective: BP 124/86   Ht 5\' 8"  (1.727 m)   Wt 137 lb (62.1 kg)   BMI 20.83 kg/m    Physical Exam Constitutional:      Appearance: She is well-developed.  Genitourinary:     Vulva, vagina, cervix, uterus, right adnexa and left adnexa normal.     No vulval lesion or tenderness noted.     No vaginal discharge, erythema or tenderness.     No cervical polyp.     Uterus is not enlarged or tender.     No right or left adnexal mass  present.     Right adnexa not tender.     Left adnexa not tender.  Neck:     Thyroid: No thyromegaly.  Cardiovascular:     Rate and Rhythm: Normal rate and regular rhythm.     Heart sounds: Normal heart sounds. No murmur heard.   Pulmonary:     Effort: Pulmonary effort is normal.     Breath sounds: Normal breath sounds.  Chest:     Breasts:        Right: No mass, nipple discharge, skin change  or tenderness.        Left: No mass, nipple discharge, skin change or tenderness.  Abdominal:     Palpations: Abdomen is soft.     Tenderness: There is no abdominal tenderness. There is no guarding.  Musculoskeletal:        General: Normal range of motion.     Cervical back: Normal range of motion.  Neurological:     General: No focal deficit present.     Mental Status: She is alert and oriented to person, place, and time.     Cranial Nerves: No cranial nerve deficit.  Skin:    General: Skin is warm and dry.  Psychiatric:        Mood and Affect: Mood normal.        Behavior: Behavior normal.        Thought Content: Thought content normal.        Judgment: Judgment normal.  Vitals reviewed.     Assessment/Plan: Encounter for annual routine gynecological examination  Screening for STD (sexually transmitted disease) - Plan: Cervicovaginal ancillary only  Encounter for surveillance of injectable contraceptive - Rx RF depo. Cont ca/Vit D. - Plan: medroxyPROGESTERone Acetate 150 MG/ML SUSY  Meds ordered this encounter  Medications  . medroxyPROGESTERone Acetate 150 MG/ML SUSY    Sig: Inject 1 mL (150 mg total) into the muscle every 3 (three) months.    Dispense:  1 mL    Refill:  3    Order Specific Question:   Supervising Provider    Answer:   Nadara Mustard [[id number]]             GYN counsel adequate intake of calcium and vitamin D, diet and exercise     F/U  Return in about 1 year (around 02/06/2021).  Samora Jernberg B. Corianna Avallone, PA-C 02/07/2020 4:44 PM

## 2020-02-07 NOTE — Patient Instructions (Signed)
I value your feedback and entrusting us with your care. If you get a [facility] patient survey, I would appreciate you taking the time to let us know about your experience today. Thank you!  As of June 09, 2019, your lab results will be released to your MyChart immediately, before I even have a chance to see them. Please give me time to review them and contact you if there are any abnormalities. Thank you for your patience.  

## 2020-02-10 LAB — CERVICOVAGINAL ANCILLARY ONLY
Chlamydia: NEGATIVE
Comment: NEGATIVE
Comment: NORMAL
Neisseria Gonorrhea: NEGATIVE

## 2020-02-16 ENCOUNTER — Encounter: Payer: Medicaid Other | Admitting: Family Medicine

## 2020-02-29 ENCOUNTER — Encounter: Payer: Medicaid Other | Admitting: Family Medicine

## 2020-02-29 ENCOUNTER — Telehealth: Payer: Self-pay

## 2020-02-29 ENCOUNTER — Ambulatory Visit
Admission: EM | Admit: 2020-02-29 | Discharge: 2020-02-29 | Disposition: A | Discharge status: Home / Self Care | Payer: Medicaid Other | Attending: Emergency Medicine | Admitting: Emergency Medicine

## 2020-02-29 ENCOUNTER — Other Ambulatory Visit: Payer: Self-pay

## 2020-02-29 DIAGNOSIS — Z1152 Encounter for screening for COVID-19: Secondary | ICD-10-CM | POA: Diagnosis not present

## 2020-02-29 NOTE — Telephone Encounter (Signed)
Pt called at 12:59pm stating that she was at urgent care being tested for COVID

## 2020-02-29 NOTE — ED Triage Notes (Signed)
covid exposure , only wants testing

## 2020-03-02 ENCOUNTER — Telehealth: Payer: Self-pay | Admitting: Family Medicine

## 2020-03-02 ENCOUNTER — Other Ambulatory Visit: Payer: Self-pay | Admitting: *Deleted

## 2020-03-02 DIAGNOSIS — I10 Essential (primary) hypertension: Secondary | ICD-10-CM

## 2020-03-02 LAB — NOVEL CORONAVIRUS, NAA: SARS-CoV-2, NAA: NOT DETECTED

## 2020-03-02 MED ORDER — LISINOPRIL 10 MG PO TABS: 10.0000 mg | ORAL_TABLET | Freq: Every day | ORAL | 0 refills | Status: DC

## 2020-03-02 MED ORDER — HYDROCHLOROTHIAZIDE 25 MG PO TABS: 25.0000 mg | ORAL_TABLET | Freq: Every day | ORAL | 0 refills | Status: DC

## 2020-03-02 NOTE — Telephone Encounter (Signed)
This medication has been sent to the pharmacy  ?

## 2020-03-02 NOTE — Telephone Encounter (Signed)
Patient is out of her blood pressure medication and kidney meds please send to [facility] on [address] [phone]

## 2020-03-07 ENCOUNTER — Ambulatory Visit (INDEPENDENT_AMBULATORY_CARE_PROVIDER_SITE_OTHER): Payer: Medicaid Other | Admitting: Family Medicine

## 2020-03-07 ENCOUNTER — Other Ambulatory Visit: Payer: Self-pay

## 2020-03-07 ENCOUNTER — Encounter: Payer: Self-pay | Admitting: Family Medicine

## 2020-03-07 VITALS — BP 135/95 | HR 70 | Temp 97.5°F | Resp 16 | Ht 69.0 in | Wt 144.1 lb

## 2020-03-07 DIAGNOSIS — I10 Essential (primary) hypertension: Secondary | ICD-10-CM | POA: Diagnosis not present

## 2020-03-07 DIAGNOSIS — N1831 Chronic kidney disease, stage 3a: Secondary | ICD-10-CM

## 2020-03-07 NOTE — Patient Instructions (Signed)
I appreciate the opportunity to provide you with care for your health and wellness. Today we discussed: BP   Follow up: 4-6 weeks for BP check in with readings   Labs- today at [facility] No referrals today  Continue all medications, we might adjust depending on your lab results. Please call with your readings so we can document them.  Please continue to practice social distancing to keep you, your family, and our community safe.  If you must go out, please wear a mask and practice good handwashing.  It was a pleasure to see you and I look forward to continuing to work together on your health and well-being. Please do not hesitate to call the office if you need care or have questions about your care.  Have a wonderful day and week. With Gratitude, Tereasa Coop, DNP, AGNP-BC

## 2020-03-07 NOTE — Assessment & Plan Note (Signed)
Ordering updated labs to follow GFR trend.  Encouraged to continue medication compliance.  I have asked the office to contact nephrology previously so we can set up an appointment for her but she reports that she still has not had any calls about this.

## 2020-03-07 NOTE — Progress Notes (Signed)
Subjective:  Patient ID: Joyce [name], female    DOB: 10/24/1995  Age: 24 y.o. MRN: [id number]  CC:  Chief Complaint  Patient presents with  . Follow-up      HPI  HPI  Ms Joyce [name] is a 24 year old female patient of mine.  She presents today for follow-up on blood pressure and kidney function.  She reports taking her medications as directed daily.  But she just took them right before she walked into the office this morning and they have not had time to get into her system.  She reports that her blood pressures at home the numbers that she recalls seeing recently are 150-160 systolic range.  With the diastolic range 60-80.  She denies having any cardiac symptoms.  She denies any chest pain, headaches or dizziness.  Regional report she has had some changes in her kidney function but she has not been able to get her lab work is willing to go get it today.  She reports that she never heard from the kidney doctor either even though the referral was put in back in May.  Overall she is doing well today.  Today patient denies signs and symptoms of COVID 19 infection including fever, chills, cough, shortness of breath, and headache. Past Medical, Surgical, Social History, Allergies, and Medications have been Reviewed.   Past Medical History:  Diagnosis Date  . Allergy    seasonal  . Anxiety   . Elevated blood-pressure reading without diagnosis of hypertension 12/26/2014  . Elevated serum creatinine 12/26/2014  . Gestational hypertension 02/12/2015  . H/O acute renal failure 12/26/2014  . Heart murmur    was told that she had beginning stages  . Migraine   . Post-viral cough syndrome 06/20/2019  . Renal dysfunction   . Renal dysfunction 12/26/2014  . S/P thyroid surgery 12/26/2014  . Severe preeclampsia   . Short interval between pregnancies complicating pregnancy in third trimester, antepartum   . Substance abuse (HCC)    uses marijuanna  . Thyroid disease    right side removed     Current Meds  Medication Sig  . hydrochlorothiazide (HYDRODIURIL) 25 MG tablet Take 1 tablet (25 mg total) by mouth daily.  Marland Kitchen lisinopril (ZESTRIL) 10 MG tablet Take 1 tablet (10 mg total) by mouth daily.  . medroxyPROGESTERone Acetate 150 MG/ML SUSY Inject 1 mL (150 mg total) into the muscle every 3 (three) months.    ROS:  Review of Systems  Constitutional: Negative.   HENT: Negative.   Eyes: Negative.   Respiratory: Negative.   Cardiovascular: Negative.   Gastrointestinal: Negative.   Genitourinary: Negative.   Musculoskeletal: Negative.   Skin: Negative.   Neurological: Negative.   Endo/Heme/Allergies: Negative.   Psychiatric/Behavioral: Negative.      Objective:   Today's Vitals: BP (!) 135/95   Pulse 70   Temp (!) 97.5 F (36.4 C) (Temporal)   Resp 16   Ht 5\' 9"  (1.753 m)   Wt 144 lb 1.9 oz (65.4 kg)   SpO2 99%   BMI 21.28 kg/m  Vitals with BMI 03/07/2020 03/07/2020 02/29/2020  Height - 5\' 9"  -  Weight - 144 lbs 2 oz -  BMI - 21.27 -  Systolic 135 147 04/30/2020  Diastolic 95 98 91  Pulse - 70     Physical Exam Vitals and nursing note reviewed.  Constitutional:      Appearance: Normal appearance. She is well-developed, well-groomed and normal weight.  HENT:  Head: Normocephalic and atraumatic.     Right Ear: External ear normal.     Left Ear: External ear normal.     Mouth/Throat:     Comments: Mask in place  Eyes:     General:        Right eye: No discharge.        Left eye: No discharge.     Conjunctiva/sclera: Conjunctivae normal.  Cardiovascular:     Rate and Rhythm: Normal rate and regular rhythm.     Pulses: Normal pulses.     Heart sounds: Normal heart sounds.  Pulmonary:     Effort: Pulmonary effort is normal.     Breath sounds: Normal breath sounds.  Musculoskeletal:        General: Normal range of motion.     Cervical back: Normal range of motion and neck supple.  Skin:    General: Skin is warm.  Neurological:     General: No  focal deficit present.     Mental Status: She is alert and oriented to person, place, and time.  Psychiatric:        Attention and Perception: Attention normal.        Mood and Affect: Mood normal.        Speech: Speech normal.        Behavior: Behavior normal. Behavior is cooperative.        Thought Content: Thought content normal.        Cognition and Memory: Cognition normal.        Judgment: Judgment normal.     Assessment   1. Essential hypertension   2. Stage 3a chronic kidney disease     Tests ordered Orders Placed This Encounter  Procedures  . CBC  . Comprehensive metabolic panel     Plan: Please see assessment and plan per problem list above.   No orders of the defined types were placed in this encounter.   Patient to follow-up in 4-6 weeks for BP   Freddy Finner, NP

## 2020-03-07 NOTE — Assessment & Plan Note (Signed)
We will continue her medications at this time.  Her current blood pressures that she is recalling at home are elevated 150s to 160s systolically.  She has not had time to get her kidney function lab work.  I had referred her to nephrology as well secondary to changes in her kidney function.  We will continue all her medications and look at what her labs are and adjust as needed.  I have encouraged her to maintain a diet and to do cardiovascular such as walking exercises.  She agrees to this plan of care.

## 2020-03-08 LAB — COMPREHENSIVE METABOLIC PANEL
ALT: 8 IU/L (ref 0–32)
AST: 19 IU/L (ref 0–40)
Albumin/Globulin Ratio: 2.5 — ABNORMAL HIGH (ref 1.2–2.2)
Albumin: 4.3 g/dL (ref 3.9–5.0)
Alkaline Phosphatase: 86 IU/L (ref 48–121)
BUN/Creatinine Ratio: 8 — ABNORMAL LOW (ref 9–23)
BUN: 12 mg/dL (ref 6–20)
Bilirubin Total: 0.4 mg/dL (ref 0.0–1.2)
CO2: 19 mmol/L — ABNORMAL LOW (ref 20–29)
Calcium: 9.3 mg/dL (ref 8.7–10.2)
Chloride: 108 mmol/L — ABNORMAL HIGH (ref 96–106)
Creatinine, Ser: 1.46 mg/dL — ABNORMAL HIGH (ref 0.57–1.00)
GFR calc Af Amer: 58 mL/min/{1.73_m2} — ABNORMAL LOW (ref >59)
GFR calc non Af Amer: 50 mL/min/{1.73_m2} — ABNORMAL LOW (ref >59)
Globulin, Total: 1.7 g/dL (ref 1.5–4.5)
Glucose: 86 mg/dL (ref 65–99)
Potassium: 4.2 mmol/L (ref 3.5–5.2)
Sodium: 141 mmol/L (ref 134–144)
Total Protein: 6 g/dL (ref 6.0–8.5)

## 2020-03-08 LAB — CBC
Hematocrit: 37.1 % (ref 34.0–46.6)
Hemoglobin: 11.5 g/dL (ref 11.1–15.9)
MCH: 28 pg (ref 26.6–33.0)
MCHC: 31 g/dL — ABNORMAL LOW (ref 31.5–35.7)
MCV: 91 fL (ref 79–97)
Platelets: 167 10*3/uL (ref 150–450)
RBC: 4.1 x10E6/uL (ref 3.77–5.28)
RDW: 13 % (ref 11.7–15.4)
WBC: 5.3 10*3/uL (ref 3.4–10.8)

## 2020-03-21 ENCOUNTER — Encounter: Payer: Medicaid Other | Admitting: Family Medicine

## 2020-04-04 ENCOUNTER — Encounter: Payer: Self-pay | Admitting: Family Medicine

## 2020-04-04 ENCOUNTER — Other Ambulatory Visit: Payer: Self-pay

## 2020-04-04 ENCOUNTER — Ambulatory Visit (INDEPENDENT_AMBULATORY_CARE_PROVIDER_SITE_OTHER): Payer: Medicaid Other | Admitting: Family Medicine

## 2020-04-04 VITALS — BP 105/60 | HR 88 | Resp 16 | Ht 69.0 in | Wt 133.1 lb

## 2020-04-04 DIAGNOSIS — N1831 Chronic kidney disease, stage 3a: Secondary | ICD-10-CM | POA: Diagnosis not present

## 2020-04-04 DIAGNOSIS — I10 Essential (primary) hypertension: Secondary | ICD-10-CM

## 2020-04-04 DIAGNOSIS — R634 Abnormal weight loss: Secondary | ICD-10-CM | POA: Diagnosis not present

## 2020-04-04 DIAGNOSIS — E441 Mild protein-calorie malnutrition: Secondary | ICD-10-CM

## 2020-04-04 DIAGNOSIS — Z0001 Encounter for general adult medical examination with abnormal findings: Secondary | ICD-10-CM

## 2020-04-04 NOTE — Assessment & Plan Note (Signed)
GFR trend is staying end-stage 3 a chronic kidney disease.  Have been trying for a while not to get her into the nephrologist she reports that she has not had any calls about this.  Advised our office to call and make the appointment for today while she is in office to help prevent this from being continuously missed.  She is in agreements with this.  Continue good control of blood pressure at this time and encourage better eating habits and hydration habits

## 2020-04-04 NOTE — Assessment & Plan Note (Signed)
Unintended weight loss she reports that she does not keep up with her eating like she should.  She reports the Depo injection helps her eat some and then once it starts to wear off she is not eating any more.  She reports she does not stay hydrated as she should either.  I would like for her to start a prenatal vitamin, calcium and vitamin D in addition to starting Ensure or some other type of supplement when she does not feel like she can eat. Patient acknowledged agreement and understanding of the plan.  Possible need for dietitian intervention if this continues.  But she reports that she is always been like this since she was a teenager.

## 2020-04-04 NOTE — Progress Notes (Signed)
Health Maintenance reviewed -   Immunization History  Administered Date(s) Administered  . Influenza,inj,Quad PF,6+ Mos 02/16/2019   Last Pap smear: [facility] in [place] Last mammogram: n/a Last colonoscopy: n/a Last DEXA: n/a Dentist: Twice yearly- no changes Ophtho: needs referral  Exercise: no  Smoker: no  Alcohol Use: no   Other doctors caring for patient include:  Patient Care Team: Freddy Finner, NP as PCP - General (Family Medicine)  End of Life Discussion:  Patient does not have a living will and medical power of attorney  Subjective:   HPI  Joyce [name] is a 24 y.o. female who presents for annual comprehensive visit and follow-up on chronic medical conditions.  She has the following concerns: Regarding her getting the Covid vaccine secondary to starting work with current health, following up on her labs at the beginning of September as she never heard from the nephrologist.  Additionally reports that she has had trouble eating again has had almost a 10 pound weight loss in the last month.\She also reports that at times she will feel like she is short of breath and she cannot pass out but she reports that she also thinks this could be related to not drinking enough fluids.  She denies having any elevated or low blood pressure during this.  She denies having any anxiety during these events.  She denies having any chest pain, palpitations, leg swelling, of cough, vision changes, dizziness or blurred vision during the event.. Otherwise she has no other issues or concerns and overall is doing okay.   Review Of Systems  Review of Systems  Constitutional: Positive for appetite change and unexpected weight change.  HENT: Negative.   Eyes: Negative.   Respiratory: Positive for shortness of breath.   Cardiovascular: Negative.   Gastrointestinal: Negative.   Endocrine: Negative.   Genitourinary: Negative.   Musculoskeletal: Negative.   Skin: Negative.    Neurological: Negative.   Psychiatric/Behavioral: Negative.   All other systems reviewed and are negative.   Objective:   PHYSICAL EXAM:  BP 105/60   Pulse 88   Resp 16   Ht 5\' 9"  (1.753 m)   Wt 133 lb 1.9 oz (60.4 kg)   SpO2 95%   BMI 19.66 kg/m   Physical Exam Vitals and nursing note reviewed.  Constitutional:      Appearance: Normal appearance. She is well-developed, well-groomed and normal weight.  HENT:     Head: Normocephalic.     Right Ear: Hearing and external ear normal.     Left Ear: Hearing and external ear normal.     Nose: Nose normal.     Mouth/Throat:     Lips: Pink.     Mouth: Mucous membranes are moist.     Pharynx: Oropharynx is clear.  Eyes:     General: Lids are normal.     Extraocular Movements: Extraocular movements intact.     Conjunctiva/sclera: Conjunctivae normal.     Pupils: Pupils are equal, round, and reactive to light.  Neck:     Thyroid: No thyroid mass, thyromegaly or thyroid tenderness.     Vascular: No carotid bruit.  Cardiovascular:     Rate and Rhythm: Normal rate and regular rhythm.     Pulses: Normal pulses.          Radial pulses are 2+ on the right side and 2+ on the left side.       Dorsalis pedis pulses are 2+ on the right side  and 2+ on the left side.       Posterior tibial pulses are 2+ on the right side and 2+ on the left side.     Heart sounds: Normal heart sounds.  Pulmonary:     Effort: Pulmonary effort is normal.     Breath sounds: Normal breath sounds.  Abdominal:     General: Abdomen is flat. Bowel sounds are normal.     Palpations: Abdomen is soft.     Tenderness: There is no abdominal tenderness. There is no right CVA tenderness or left CVA tenderness.     Hernia: No hernia is present.  Musculoskeletal:        General: Normal range of motion.     Cervical back: Normal range of motion and neck supple.     Right lower leg: No edema.     Left lower leg: No edema.     Comments: MAE, ROM intact  Feet:      Right foot:     Skin integrity: Skin integrity normal.     Left foot:     Skin integrity: Skin integrity normal.  Lymphadenopathy:     Cervical: No cervical adenopathy.  Skin:    General: Skin is warm and dry.     Capillary Refill: Capillary refill takes less than 2 seconds.  Neurological:     General: No focal deficit present.     Mental Status: She is alert and oriented to person, place, and time. Mental status is at baseline.     Cranial Nerves: Cranial nerves are intact.     Sensory: Sensation is intact.     Motor: Motor function is intact.     Coordination: Coordination is intact.     Gait: Gait is intact.     Deep Tendon Reflexes: Reflexes are normal and symmetric.  Psychiatric:        Attention and Perception: Attention and perception normal.        Mood and Affect: Mood and affect normal.        Speech: Speech normal.        Behavior: Behavior normal. Behavior is cooperative.        Thought Content: Thought content normal.        Cognition and Memory: Cognition and memory normal.        Judgment: Judgment normal.     Depression Screening  Depression screen [facility] 2/9 04/04/2020 03/07/2020 12/06/2019 06/20/2019 03/16/2019  Decreased Interest 0 0 0 0 0  Down, Depressed, Hopeless 0 0 0 0 0  PHQ - 2 Score 0 0 0 0 0  Altered sleeping - 1 0 - -  Tired, decreased energy - 1 0 - -  Change in appetite - 0 0 - -  Feeling bad or failure about yourself  - 0 0 - -  Trouble concentrating - 0 0 - -  Moving slowly or fidgety/restless - 0 0 - -  Suicidal thoughts - 0 0 - -  PHQ-9 Score - 2 0 - -  Difficult doing work/chores - Not difficult at all Not difficult at all - -     Falls  Fall Risk  04/04/2020 03/07/2020 06/20/2019 03/16/2019 02/16/2019  Falls in the past year? 0 0 0 0 0  Number falls in past yr: - 0 0 0 0  Injury with Fall? - 0 0 0 0  Risk for fall due to : - No Fall Risks - - -  Follow up - Falls evaluation completed - - -  Assessment & Plan:   1. Annual visit for  general adult medical examination with abnormal findings   2. Malnutrition of mild degree (HCC)   3. Stage 3a chronic kidney disease (HCC)   4. Essential hypertension   5. Unintended weight loss     Tests ordered No orders of the defined types were placed in this encounter.    Plan: Please see assessment and plan per problem list above.   No orders of the defined types were placed in this encounter.  I have personally reviewed: The patient's medical and social history Their use of alcohol, tobacco or illicit drugs Their current medications and supplements The patient's functional ability including ADLs,fall risks, home safety risks, cognitive, and hearing and visual impairment Diet and physical activities Evidence for depression or mood disorders  The patient's weight, height, BMI, and visual acuity have been recorded in the chart.  I have made referrals, counseling, and provided education to the patient based on review of the above and I have provided the patient with a written personalized care plan for preventive services.    Note: This dictation was prepared with Dragon dictation along with smaller phrase technology. Similar sounding words can be transcribed inadequately or may not be corrected upon review. Any transcriptional errors that result from this process are unintentional.      Freddy Finner, NP   04/04/2020

## 2020-04-04 NOTE — Assessment & Plan Note (Signed)
Discussed monthly self breast exams and yearly mammograms; at least 30 minutes of aerobic activity at least 5 days/week and weight-bearing exercise 2x/week; proper sunscreen use reviewed; healthy diet, including goals of calcium and vitamin D intake and alcohol recommendations (less than or equal to 1 drink/day) reviewed; regular seatbelt use; changing batteries in smoke detectors.  Immunization recommendations discussed.  Colonoscopy recommendations reviewed.  

## 2020-04-04 NOTE — Assessment & Plan Note (Signed)
Blood pressure is reading low today.  Not in her normal range.  Even for when she is controlled.  Reports that she might be dehydrated when questioned.  Do not want to do any adjustments to medication at this time since unsure of accuracy of blood pressure. Appointment with nephrologist have been set up. Encouraged to do DASH diet and to eat more consistently regularly.  As she has had a 10 pound weight loss in a month.  And this also could be affecting her blood pressure. Patient acknowledged agreement and understanding of the plan.

## 2020-04-04 NOTE — Patient Instructions (Addendum)
  HAPPY FALL!  I appreciate the opportunity to provide you with care for your health and wellness. Today we discussed: overall health   Follow up: 3 months for weight check in and BP check  No labs or referrals today  Wishing you luck on the new job!  When you do take the covid vaccine. Take tylenol 2-4 hours afterwards and as needed for 1-2 days afterwards. Please drink a lot of fluids day before, day of and day after vaccine.  Start prenatal with iron once daily Calcium 500 mg once daily Vitamin D3 1,000 once daily   Set an alarm on your phone to remind you when to eat and drink. :)  Please continue to practice social distancing to keep you, your family, and our community safe.  If you must go out, please wear a mask and practice good handwashing.  It was a pleasure to see you and I look forward to continuing to work together on your health and well-being. Please do not hesitate to call the office if you need care or have questions about your care.  Have a wonderful day and week. With Gratitude, Tereasa Coop, DNP, AGNP-BC  HEALTH MAINTENANCE RECOMMENDATIONS:  It is recommended that you get at least 30 minutes of aerobic exercise at least 5 days/week (for weight loss, you may need as much as 60-90 minutes). This can be any activity that gets your heart rate up. This can be divided in 10-15 minute intervals if needed, but try and build up your endurance at least once a week.  Weight bearing exercise is also recommended twice weekly.  Eat a healthy diet with lots of vegetables, fruits and fiber.  "Colorful" foods have a lot of vitamins (ie green vegetables, tomatoes, red peppers, etc).  Limit sweet tea, regular sodas and alcoholic beverages, all of which has a lot of calories and sugar.  Up to 1 alcoholic drink daily may be beneficial for women (unless trying to lose weight, watch sugars).  Drink a lot of water.  Calcium recommendations are 1200-1500 mg daily (1500 mg for  postmenopausal women or women without ovaries), and vitamin D 1000 IU daily.  This should be obtained from diet and/or supplements (vitamins), and calcium should not be taken all at once, but in divided doses.  Monthly self breast exams and yearly mammograms for women over the age of 55 is recommended.  Sunscreen of at least SPF 30 should be used on all sun-exposed parts of the skin when outside between the hours of 10 am and 4 pm (not just when at beach or pool, but even with exercise, golf, tennis, and yard work!)  Use a sunscreen that says "broad spectrum" so it covers both UVA and UVB rays, and make sure to reapply every 1-2 hours.  Remember to change the batteries in your smoke detectors when changing your clock times in the spring and fall.  Use your seat belt every time you are in a car, and please drive safely and not be distracted with cell phones and texting while driving.

## 2020-04-04 NOTE — Assessment & Plan Note (Signed)
Unintended weight loss she reports that she does not keep up with her eating like she should. She has lost 11 pounds in the last 28 days. She reports she does not stay hydrated as she should either.  I would like for her to start a prenatal vitamin, calcium and vitamin D in addition to starting Ensure or some other type of supplement when she does not feel like she can eat. Patient acknowledged agreement and understanding of the plan.  Possible need for dietitian intervention if this continues.  But she reports that she is always been like this since she was a teenager.

## 2020-04-09 ENCOUNTER — Ambulatory Visit: Payer: Self-pay

## 2020-04-11 ENCOUNTER — Ambulatory Visit: Payer: Self-pay

## 2020-04-11 ENCOUNTER — Other Ambulatory Visit: Payer: Self-pay | Admitting: Obstetrics and Gynecology

## 2020-04-11 DIAGNOSIS — Z3042 Encounter for surveillance of injectable contraceptive: Secondary | ICD-10-CM

## 2020-04-11 NOTE — Telephone Encounter (Signed)
Pls check with pharm. #1 with 3 RF sent 8/21.

## 2020-04-11 NOTE — Telephone Encounter (Signed)
Pt calling; needs refill of depo.  [phone]  Called [facility] spoke c Alycia Rossetti; they did not receive rx 02/07/20; verbal order given.  Pt aware and states she isn't going to make appt today.  Tx'd to [facility] for rescheduling.

## 2020-04-13 ENCOUNTER — Ambulatory Visit: Payer: Self-pay

## 2020-04-16 ENCOUNTER — Telehealth: Payer: Self-pay | Admitting: Obstetrics and Gynecology

## 2020-04-16 ENCOUNTER — Ambulatory Visit (INDEPENDENT_AMBULATORY_CARE_PROVIDER_SITE_OTHER): Payer: Medicaid Other

## 2020-04-16 ENCOUNTER — Other Ambulatory Visit: Payer: Self-pay

## 2020-04-16 DIAGNOSIS — Z3042 Encounter for surveillance of injectable contraceptive: Secondary | ICD-10-CM | POA: Diagnosis not present

## 2020-04-16 MED ORDER — MEDROXYPROGESTERONE ACETATE 150 MG/ML IM SUSP
150.0000 mg | Freq: Once | INTRAMUSCULAR | Status: AC
  Administered 2020-04-16: 150 mg via INTRAMUSCULAR

## 2020-04-16 NOTE — Telephone Encounter (Signed)
Patient is scheduled for today at 2 pm for Depo. Patient is requesting refill to be [facility] to [facility] in [place].

## 2020-04-16 NOTE — Telephone Encounter (Signed)
Called pt and advised Rx for 1 yr supply sent to annual. Pt will call pharmacy to fill Rx. Will call back if she has any issues.

## 2020-04-20 ENCOUNTER — Telehealth: Payer: Self-pay | Admitting: Family Medicine

## 2020-04-20 NOTE — Telephone Encounter (Signed)
Patient calling requesting a copy of her vaccines p# [phone]

## 2020-04-23 NOTE — Telephone Encounter (Signed)
Voice mail box not set up.

## 2020-04-24 NOTE — Telephone Encounter (Signed)
Pt informed all we have on file is is her flu shot. She is going to call her pediatrician and get them to print her a shot record.

## 2020-05-09 IMAGING — DX RIGHT FOREARM - 2 VIEW
2 series · 2 of 2 positions shown · non-contrast
Comparison: None.

CLINICAL DATA: Injury last night. Reported lateral right hand pain
radiating into the distal forearm.

EXAM:
RIGHT FOREARM - 2 VIEW

[forearm ap]
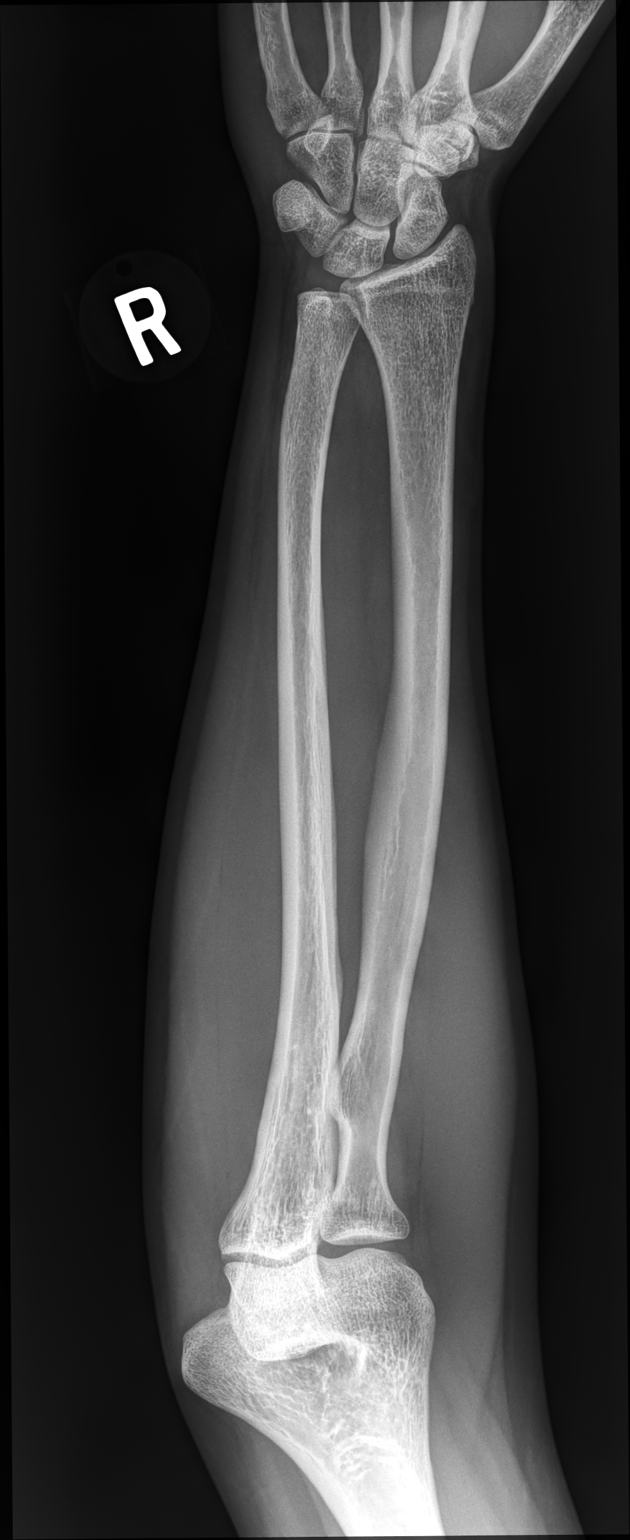

[forearm lat]
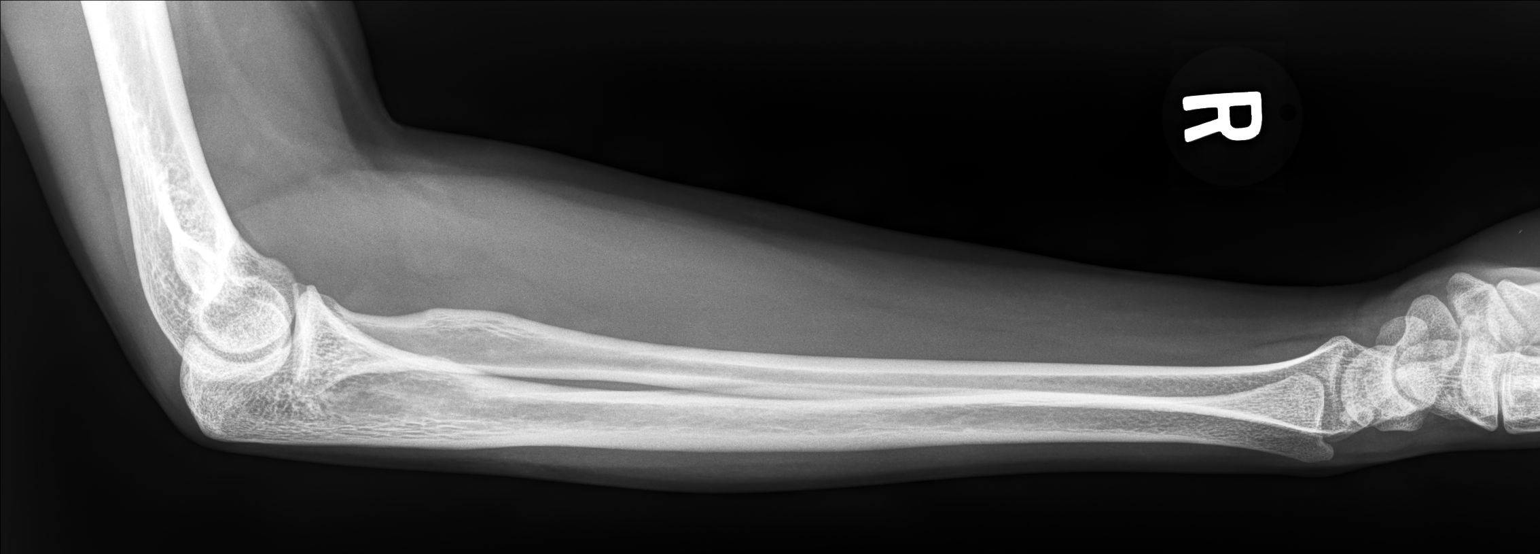

[2 of 2 positions shown; findings below may reference images not displayed]

FINDINGS: The mineralization and alignment are normal. There is no evidence of
acute fracture or dislocation. The joint spaces are preserved. No
foreign body or focal soft tissue swelling identified. No
significant effusion identified at the right elbow.
IMPRESSION: Normal right forearm radiographs.

## 2020-05-09 IMAGING — DX RIGHT HAND - COMPLETE 3+ VIEW
3 series · 3 of 3 positions shown · non-contrast
Comparison: None.

CLINICAL DATA: Injury last night. Reported lateral right hand pain
radiating into the distal forearm.

EXAM:
RIGHT HAND - COMPLETE 3+ VIEW

[hand pa]
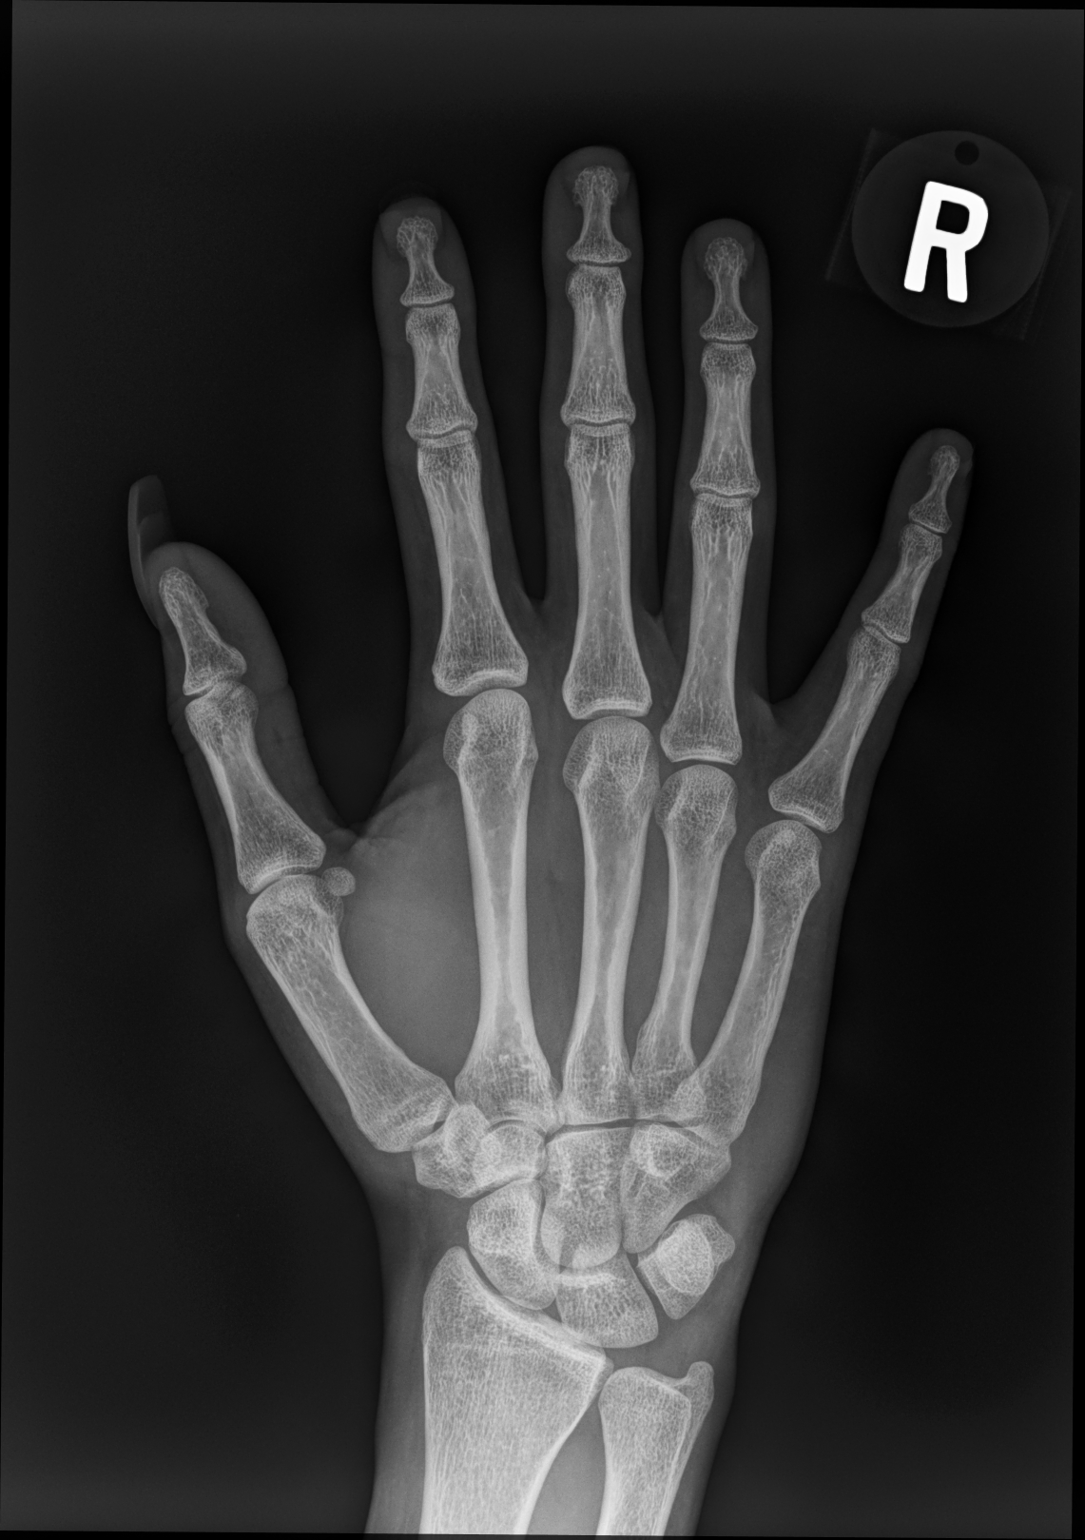

[hand mlo]
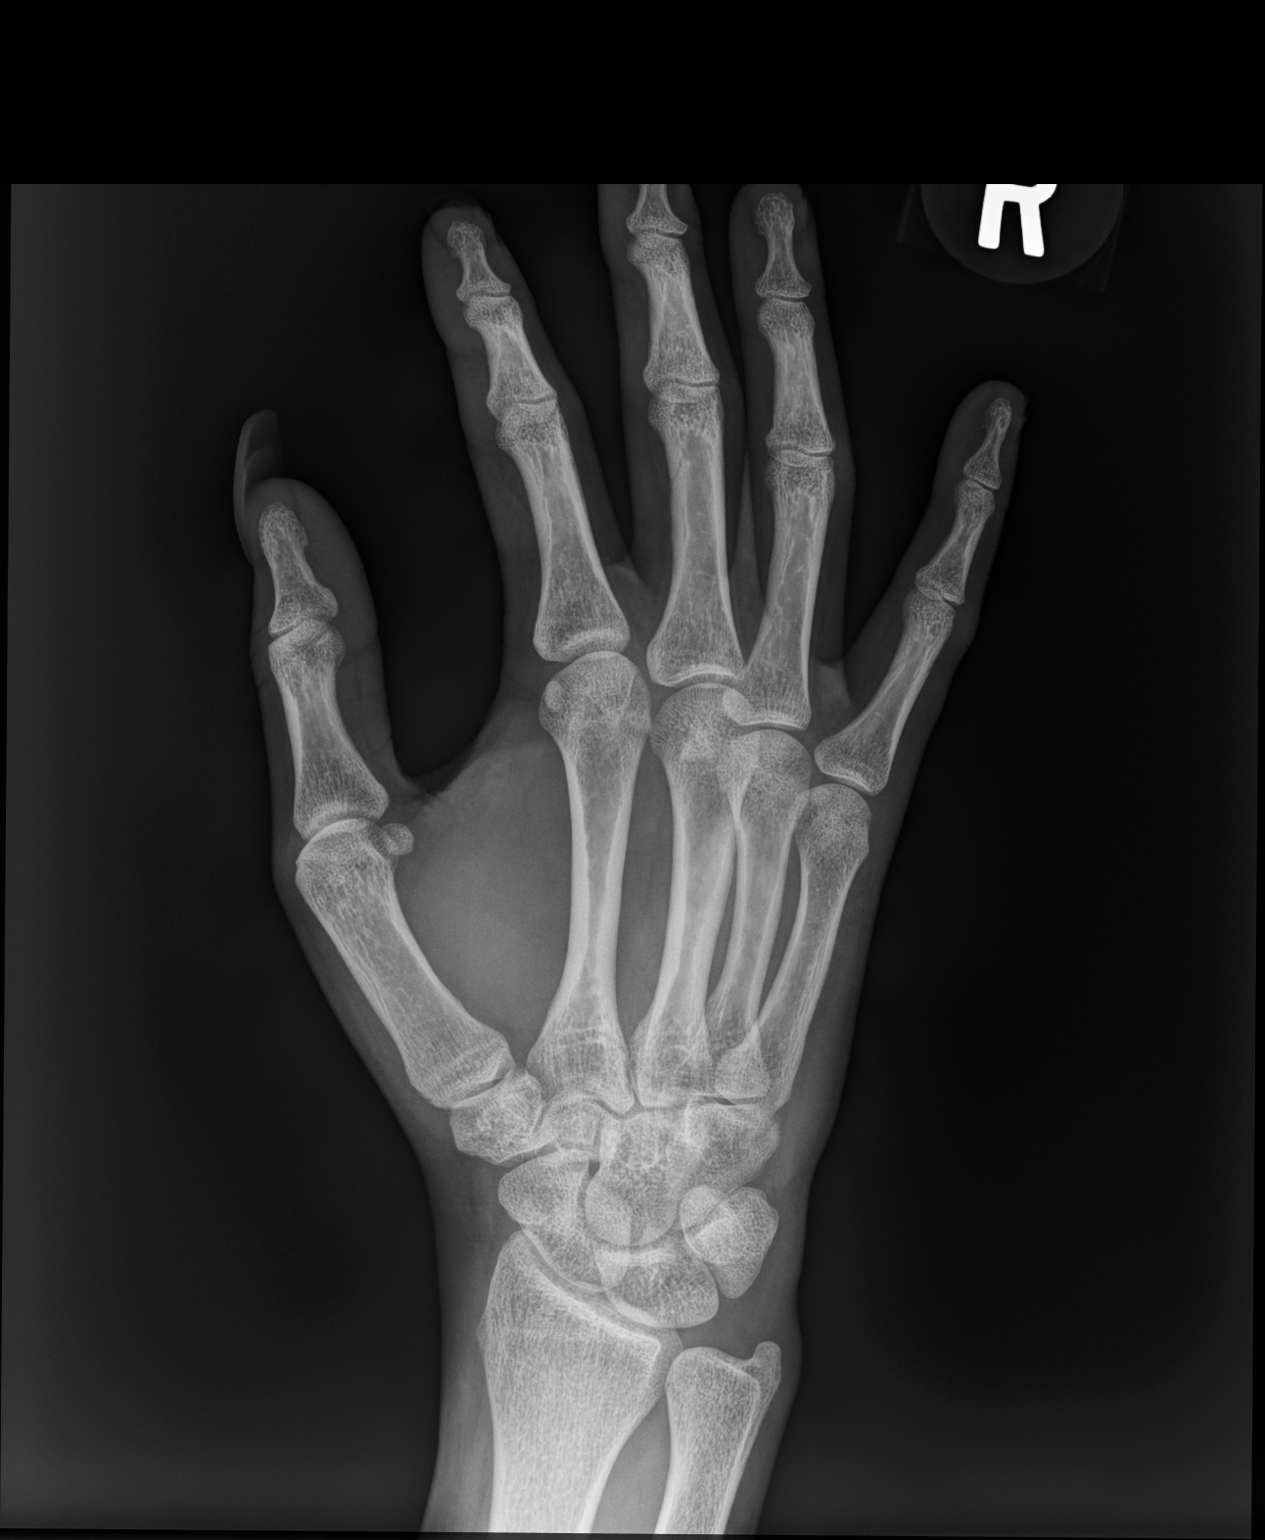

[hand lat]
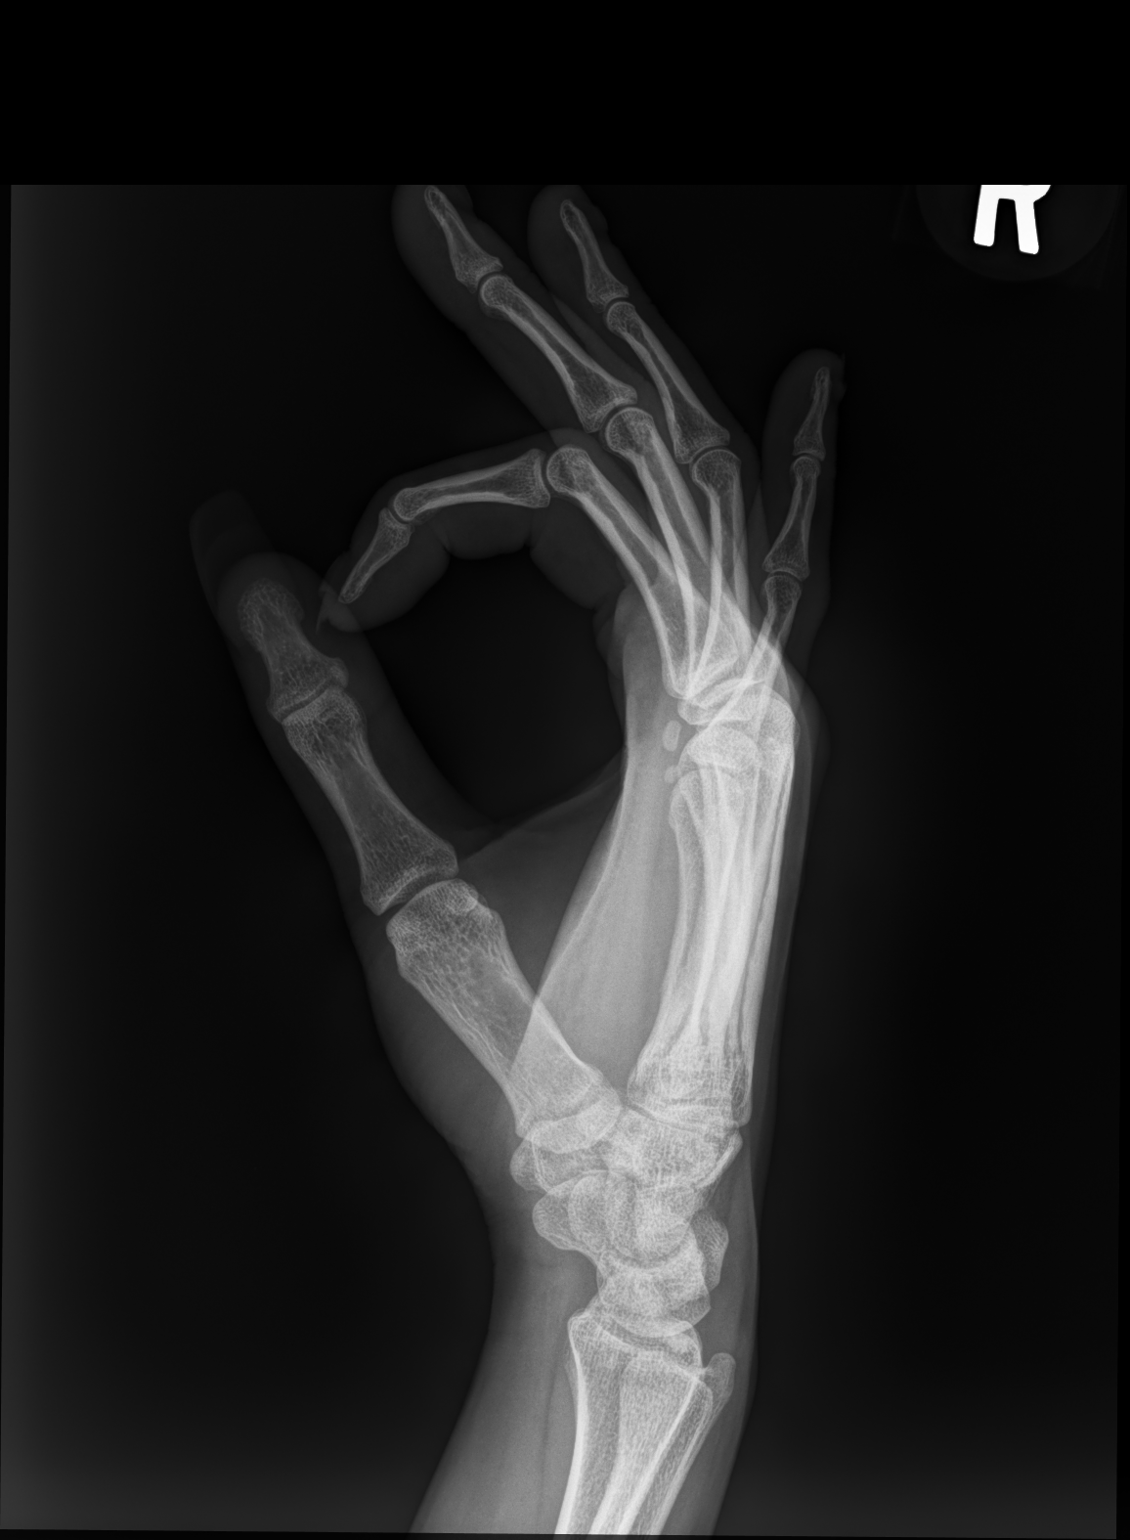

[3 of 3 positions shown; findings below may reference images not displayed]

FINDINGS: The mineralization and alignment are normal. There is no evidence of
acute fracture or dislocation. The joint spaces are preserved. No
focal soft tissue swelling or foreign body identified.
IMPRESSION: Normal right hand radiographs.

## 2020-05-21 DIAGNOSIS — N183 Chronic kidney disease, stage 3 unspecified: Secondary | ICD-10-CM | POA: Diagnosis not present

## 2020-05-21 DIAGNOSIS — I129 Hypertensive chronic kidney disease with stage 1 through stage 4 chronic kidney disease, or unspecified chronic kidney disease: Secondary | ICD-10-CM | POA: Diagnosis not present

## 2020-06-21 ENCOUNTER — Telehealth: Payer: Self-pay

## 2020-06-21 NOTE — Telephone Encounter (Signed)
Pt calling; has questions.  [phone]  vm not set up yet 3;27.

## 2020-06-21 NOTE — Telephone Encounter (Signed)
4:46 no answer.

## 2020-06-25 ENCOUNTER — Encounter: Payer: Self-pay | Admitting: Obstetrics and Gynecology

## 2020-06-25 ENCOUNTER — Ambulatory Visit (INDEPENDENT_AMBULATORY_CARE_PROVIDER_SITE_OTHER): Payer: Medicaid Other | Admitting: Obstetrics and Gynecology

## 2020-06-25 ENCOUNTER — Other Ambulatory Visit: Payer: Self-pay

## 2020-06-25 VITALS — BP 116/78 | HR 72 | Ht 69.0 in | Wt 132.0 lb

## 2020-06-25 DIAGNOSIS — N760 Acute vaginitis: Secondary | ICD-10-CM

## 2020-06-25 DIAGNOSIS — B9689 Other specified bacterial agents as the cause of diseases classified elsewhere: Secondary | ICD-10-CM | POA: Diagnosis not present

## 2020-06-25 DIAGNOSIS — R875 Abnormal microbiological findings in specimens from female genital organs: Secondary | ICD-10-CM

## 2020-06-25 DIAGNOSIS — N926 Irregular menstruation, unspecified: Secondary | ICD-10-CM | POA: Diagnosis not present

## 2020-06-25 LAB — POCT URINE PREGNANCY: Preg Test, Ur: NEGATIVE

## 2020-06-25 MED ORDER — CLINDAMYCIN HCL 300 MG PO CAPS: 300.0000 mg | ORAL_CAPSULE | Freq: Two times a day (BID) | ORAL | 0 refills | Status: AC

## 2020-06-25 NOTE — Patient Instructions (Signed)
Bacterial Vaginosis  Bacterial vaginosis is a vaginal infection that occurs when the normal balance of bacteria in the vagina is disrupted. It results from an overgrowth of certain bacteria. This is the most common vaginal infection among women ages 15-44. Because bacterial vaginosis increases your risk for STIs (sexually transmitted infections), getting treated can help reduce your risk for chlamydia, gonorrhea, herpes, and HIV (human immunodeficiency virus). Treatment is also important for preventing complications in pregnant women, because this condition can cause an early (premature) delivery. What are the causes? This condition is caused by an increase in harmful bacteria that are normally present in small amounts in the vagina. However, the reason that the condition develops is not fully understood. What increases the risk? The following factors may make you more likely to develop this condition:  Having a new sexual partner or multiple sexual partners.  Having unprotected sex.  Douching.  Having an intrauterine device (IUD).  Smoking.  Drug and alcohol abuse.  Taking certain antibiotic medicines.  Being pregnant. You cannot get bacterial vaginosis from toilet seats, bedding, swimming pools, or contact with objects around you. What are the signs or symptoms? Symptoms of this condition include:  Grey or white vaginal discharge. The discharge can also be watery or foamy.  A fish-like odor with discharge, especially after sexual intercourse or during menstruation.  Itching in and around the vagina.  Burning or pain with urination. Some women with bacterial vaginosis have no signs or symptoms. How is this diagnosed? This condition is diagnosed based on:  Your medical history.  A physical exam of the vagina.  Testing a sample of vaginal fluid under a microscope to look for a large amount of bad bacteria or abnormal cells. Your health care provider may use a cotton swab or  a small wooden spatula to collect the sample. How is this treated? This condition is treated with antibiotics. These may be given as a pill, a vaginal cream, or a medicine that is put into the vagina (suppository). If the condition comes back after treatment, a second round of antibiotics may be needed. Follow these instructions at home: Medicines  Take over-the-counter and prescription medicines only as told by your health care provider.  Take or use your antibiotic as told by your health care provider. Do not stop taking or using the antibiotic even if you start to feel better. General instructions  If you have a female sexual partner, tell her that you have a vaginal infection. She should see her health care provider and be treated if she has symptoms. If you have a female sexual partner, he does not need treatment.  During treatment: ? Avoid sexual activity until you finish treatment. ? Do not douche. ? Avoid alcohol as directed by your health care provider. ? Avoid breastfeeding as directed by your health care provider.  Drink enough water and fluids to keep your urine clear or pale yellow.  Keep the area around your vagina and rectum clean. ? Wash the area daily with warm water. ? Wipe yourself from front to back after using the toilet.  Keep all follow-up visits as told by your health care provider. This is important. How is this prevented?  Do not douche.  Wash the outside of your vagina with warm water only.  Use protection when having sex. This includes latex condoms and dental dams.  Limit how many sexual partners you have. To help prevent bacterial vaginosis, it is best to have sex with just one partner (  monogamous).  Make sure you and your sexual partner are tested for STIs.  Wear cotton or cotton-lined underwear.  Avoid wearing tight pants and pantyhose, especially during summer.  Limit the amount of alcohol that you drink.  Do not use any products that contain  nicotine or tobacco, such as cigarettes and e-cigarettes. If you need help quitting, ask your health care provider.  Do not use illegal drugs. Where to find more information  Centers for Disease Control and Prevention: www.cdc.gov/std  American Sexual Health Association (ASHA): www.ashastd.org  U.S. Department of Health and Human Services, Office on Women's Health: www.womenshealth.gov/ or https://www.womenshealth.gov/a-z-topics/bacterial-vaginosis Contact a health care provider if:  Your symptoms do not improve, even after treatment.  You have more discharge or pain when urinating.  You have a fever.  You have pain in your abdomen.  You have pain during sex.  You have vaginal bleeding between periods. Summary  Bacterial vaginosis is a vaginal infection that occurs when the normal balance of bacteria in the vagina is disrupted.  Because bacterial vaginosis increases your risk for STIs (sexually transmitted infections), getting treated can help reduce your risk for chlamydia, gonorrhea, herpes, and HIV (human immunodeficiency virus). Treatment is also important for preventing complications in pregnant women, because the condition can cause an early (premature) delivery.  This condition is treated with antibiotic medicines. These may be given as a pill, a vaginal cream, or a medicine that is put into the vagina (suppository). This information is not intended to replace advice given to you by your health care provider. Make sure you discuss any questions you have with your health care provider. Document Revised: 05/29/2017 Document Reviewed: 03/01/2016 Elsevier Patient Education  2020 Elsevier Inc.  

## 2020-06-25 NOTE — Progress Notes (Signed)
Patient ID: Joyce [name], female   DOB: 08/10/1995, 24 y.o.   MRN: [id number]  Reason for Consult: Gynecologic Exam (Pelvic soreness, "in vagina" no discharge)   Referred by Freddy Finner, NP  Subjective:     HPI:  Joyce [name] is a 24 y.o. female . She requests a pregnancy test today. She also has noted recent vaginal discomfort and itching. She would like to be checked for vaginal infection   Past Medical History:  Diagnosis Date  . Allergy    seasonal  . Anxiety   . Elevated blood-pressure reading without diagnosis of hypertension 12/26/2014  . Elevated serum creatinine 12/26/2014  . Gestational hypertension 02/12/2015  . H/O acute renal failure 12/26/2014  . Heart murmur    was told that she had beginning stages  . Migraine   . Post-viral cough syndrome 06/20/2019  . Renal dysfunction   . Renal dysfunction 12/26/2014  . S/P thyroid surgery 12/26/2014  . Severe preeclampsia   . Short interval between pregnancies complicating pregnancy in third trimester, antepartum   . Substance abuse (HCC)    uses marijuanna  . Tension headache 11/11/2018  . Thyroid disease    right side removed  . Underweight 12/26/2014   Family History  Problem Relation Age of Onset  . Hypertension Mother   . Birth defects Paternal Grandmother   . Hypertension Maternal Grandmother    Past Surgical History:  Procedure Laterality Date  . THYROID SURGERY      Short Social History:  Social History   Tobacco Use  . Smoking status: Former Smoker    Types: Cigarettes  . Smokeless tobacco: Never Used  Substance Use Topics  . Alcohol use: Yes    Comment: occasion    No Known Allergies  Current Outpatient Medications  Medication Sig Dispense Refill  . hydrochlorothiazide (HYDRODIURIL) 25 MG tablet Take 1 tablet (25 mg total) by mouth daily. 90 tablet 0  . lisinopril (ZESTRIL) 10 MG tablet Take 1 tablet (10 mg total) by mouth daily. 90 tablet 0  . medroxyPROGESTERone Acetate 150 MG/ML  SUSY Inject 1 mL (150 mg total) into the muscle every 3 (three) months. 1 mL 3  . clindamycin (CLEOCIN) 300 MG capsule Take 1 capsule (300 mg total) by mouth 2 (two) times daily for 7 days. 14 capsule 0   No current facility-administered medications for this visit.    Review of Systems  Constitutional: Negative for chills, fatigue, fever and unexpected weight change.  HENT: Negative for trouble swallowing.  Eyes: Negative for loss of vision.  Respiratory: Negative for cough, shortness of breath and wheezing.  Cardiovascular: Negative for chest pain, leg swelling, palpitations and syncope.  GI: Negative for abdominal pain, blood in stool, diarrhea, nausea and vomiting.  GU: Negative for difficulty urinating, dysuria, frequency and hematuria.  Musculoskeletal: Negative for back pain, leg pain and joint pain.  Skin: Negative for rash.  Neurological: Negative for dizziness, headaches, light-headedness, numbness and seizures.  Psychiatric: Negative for behavioral problem, confusion, depressed mood and sleep disturbance.        Objective:  Objective   Vitals:   06/25/20 0855  BP: 116/78  Pulse: 72  Weight: 132 lb (59.9 kg)  Height: 5\' 9"  (1.753 m)   Body mass index is 19.49 kg/m.  Physical Exam Vitals and nursing note reviewed. Exam conducted with a chaperone present.  Constitutional:      Appearance: She is well-developed and well-nourished.  HENT:     Head: Normocephalic  and atraumatic.  Eyes:     Extraocular Movements: EOM normal.     Pupils: Pupils are equal, round, and reactive to light.  Cardiovascular:     Rate and Rhythm: Normal rate and regular rhythm.  Pulmonary:     Effort: Pulmonary effort is normal. No respiratory distress.  Genitourinary:    Comments: External: Normal appearing vulva. No lesions noted.  Speculum examination: Normal appearing cervix. No blood in the vaginal vault. Scant pink discharge.      Skin:    General: Skin is warm and dry.   Neurological:     Mental Status: She is alert and oriented to person, place, and time.  Psychiatric:        Mood and Affect: Mood and affect normal.        Behavior: Behavior normal.        Thought Content: Thought content normal.        Judgment: Judgment normal.    Wet Prep: PH: normal Clue Cells: Positive Fungal elements: Negative Trichomonas: Negative   Assessment/Plan:     24 yo with acute vaginitis Will treat for presumptive BV based on wet prep result. RX for oral medication sent.  Nuswab sent for confirmation.   Pregnancy test negative today.  More than 20 minutes were spent face to face with the patient in the room, reviewing the medical record, labs and images, and coordinating care for the patient. The plan of management was discussed in detail and counseling was provided.     Adelene Idler MD [facility], [facility] 06/25/2020 9:34 AM

## 2020-06-28 LAB — NUSWAB VAGINITIS PLUS (VG+)
Atopobium vaginae: HIGH Score — AB
BVAB 2: HIGH Score — AB
Candida albicans, NAA: NEGATIVE
Candida glabrata, NAA: NEGATIVE
Chlamydia trachomatis, NAA: NEGATIVE
Megasphaera 1: HIGH Score — AB
Neisseria gonorrhoeae, NAA: NEGATIVE
Trich vag by NAA: NEGATIVE

## 2020-06-28 NOTE — Telephone Encounter (Signed)
Pt seen 06/25/20.

## 2020-07-04 ENCOUNTER — Encounter: Payer: Self-pay | Admitting: Family Medicine

## 2020-07-04 ENCOUNTER — Ambulatory Visit: Payer: Medicaid Other | Admitting: Family Medicine

## 2020-07-04 ENCOUNTER — Other Ambulatory Visit: Payer: Self-pay

## 2020-07-04 VITALS — BP 133/85 | HR 80 | Temp 99.0°F | Resp 18 | Ht 69.0 in | Wt 130.0 lb

## 2020-07-04 DIAGNOSIS — F419 Anxiety disorder, unspecified: Secondary | ICD-10-CM | POA: Diagnosis not present

## 2020-07-04 DIAGNOSIS — I10 Essential (primary) hypertension: Secondary | ICD-10-CM | POA: Diagnosis not present

## 2020-07-04 DIAGNOSIS — E441 Mild protein-calorie malnutrition: Secondary | ICD-10-CM | POA: Diagnosis not present

## 2020-07-04 MED ORDER — MIRTAZAPINE 15 MG PO TABS: 15.0000 mg | ORAL_TABLET | Freq: Every day | ORAL | 1 refills | Status: DC

## 2020-07-04 NOTE — Assessment & Plan Note (Signed)
I think anxiety is not well controlled and this is causing weight loss  I suggested therapy but she is more looking for a medication to start with first. I will trial Remeron with close follow up.  Denies SI and HI today in office

## 2020-07-04 NOTE — Assessment & Plan Note (Signed)
Joyce [name] is encouraged to maintain a well balanced diet that is low in salt. Controlled, continue current medication regimen.   Additionally, she is also reminded that exercise is beneficial for heart health and control of  Blood pressure. 30-60 minutes daily is recommended-walking was suggested.

## 2020-07-04 NOTE — Progress Notes (Signed)
Subjective:  Patient ID: Joyce [name], female    DOB: Nov 19, 1995  Age: 25 y.o. MRN: [id number]  CC:  Chief Complaint  Patient presents with  . Follow-up  . Weight Check  . Hypertension      HPI  HPI  Joyce [name] is a 25 year old female of mine who presents today for follow up on weight loss and BP. At her annual in Oct she reported that she has had trouble eating again has had almost a 10 pound weight loss in the last month. Today she has loss another 3 pounds. And when asked if she is eating she reports she is, but then when asked to diet recall she reports skipping several meals daily. She does report anxiety and inability to rest at night and lack of appetite. She does not want to go the therapy route yet, but is willing to try a medication with close follow up.  BP:  She denies having any elevated or low blood pressure. Overall feels this is better controlled. She denies having any chest pain, palpitations, leg swelling, of cough, vision changes, dizziness or blurred vision.  Today patient denies signs and symptoms of COVID 19 infection including fever, chills, cough, shortness of breath, and headache. Past Medical, Surgical, Social History, Allergies, and Medications have been Reviewed.   Past Medical History:  Diagnosis Date  . Allergy    seasonal  . Anxiety   . Elevated blood-pressure reading without diagnosis of hypertension 12/26/2014  . Elevated serum creatinine 12/26/2014  . Gestational hypertension 02/12/2015  . H/O acute renal failure 12/26/2014  . Heart murmur    was told that she had beginning stages  . Migraine   . Post-viral cough syndrome 06/20/2019  . Renal dysfunction   . Renal dysfunction 12/26/2014  . S/P thyroid surgery 12/26/2014  . Severe preeclampsia   . Short interval between pregnancies complicating pregnancy in third trimester, antepartum   . Substance abuse (HCC)    uses marijuanna  . Tension headache 11/11/2018  . Thyroid disease     right side removed  . Underweight 12/26/2014    Current Meds  Medication Sig  . hydrochlorothiazide (HYDRODIURIL) 25 MG tablet Take 1 tablet (25 mg total) by mouth daily.  Marland Kitchen lisinopril (ZESTRIL) 10 MG tablet Take 1 tablet (10 mg total) by mouth daily.  . medroxyPROGESTERone Acetate 150 MG/ML SUSY Inject 1 mL (150 mg total) into the muscle every 3 (three) months.  . mirtazapine (REMERON) 15 MG tablet Take 1 tablet (15 mg total) by mouth at bedtime.    ROS:  Review of Systems  Constitutional: Positive for weight loss.  HENT: Negative.   Eyes: Negative.   Respiratory: Negative.   Cardiovascular: Negative.   Gastrointestinal: Negative.   Genitourinary: Negative.   Musculoskeletal: Negative.   Skin: Negative.   Neurological: Negative.   Endo/Heme/Allergies: Negative.   Psychiatric/Behavioral: The patient is nervous/anxious.      Objective:   Today's Vitals: BP 133/85   Pulse 80   Temp 99 F (37.2 C)   Resp 18   Ht 5\' 9"  (1.753 m)   Wt 130 lb (59 kg)   LMP 06/18/2020   SpO2 98%   BMI 19.20 kg/m  Vitals with BMI 07/04/2020 06/25/2020 04/04/2020  Height 5\' 9"  5\' 9"  5\' 9"   Weight 130 lbs 132 lbs 133 lbs 2 oz  BMI 19.19 19.48 19.65  Systolic 133 116 06/04/2020  Diastolic 85 78 60  Pulse 80 72 88  Physical Exam Vitals and nursing note reviewed.  Constitutional:      Appearance: Normal appearance. She is well-developed, well-groomed and normal weight.  HENT:     Head: Normocephalic and atraumatic.     Right Ear: External ear normal.     Left Ear: External ear normal.     Mouth/Throat:     Comments: Mask in place  Eyes:     General:        Right eye: No discharge.        Left eye: No discharge.     Conjunctiva/sclera: Conjunctivae normal.  Cardiovascular:     Rate and Rhythm: Normal rate and regular rhythm.     Pulses: Normal pulses.     Heart sounds: Normal heart sounds.  Pulmonary:     Effort: Pulmonary effort is normal.     Breath sounds: Normal breath sounds.   Musculoskeletal:        General: Normal range of motion.     Cervical back: Normal range of motion and neck supple.  Skin:    General: Skin is warm.  Neurological:     General: No focal deficit present.     Mental Status: She is alert and oriented to person, place, and time.  Psychiatric:        Attention and Perception: Attention normal.        Mood and Affect: Mood normal.        Speech: Speech normal.        Behavior: Behavior normal. Behavior is cooperative.        Thought Content: Thought content normal.        Cognition and Memory: Cognition normal.        Judgment: Judgment normal.     Assessment   1. Malnutrition of mild degree (HCC)   2. Anxiety   3. Essential hypertension     Tests ordered No orders of the defined types were placed in this encounter.    Plan: Please see assessment and plan per problem list above.   Meds ordered this encounter  Medications  . mirtazapine (REMERON) 15 MG tablet    Sig: Take 1 tablet (15 mg total) by mouth at bedtime.    Dispense:  30 tablet    Refill:  1    Order Specific Question:   Supervising Provider    Answer:   Kerri Perches [[id number]]    Patient to follow-up in 4 weeks.   Freddy Finner, NP

## 2020-07-04 NOTE — Patient Instructions (Signed)
  I appreciate the opportunity to provide you with care for your health and wellness.  Follow up: 4 weeks wt check  No labs or referrals today  I have sent in a new medication to help with mood anxiety and appetite. You will take this at bedtime. If is safe to take with your level of kidney function.   Please call Dr Lucio Edward office to get updated info.  Please continue to practice social distancing to keep you, your family, and our community safe.  If you must go out, please wear a mask and practice good handwashing.  It was a pleasure to see you and I look forward to continuing to work together on your health and well-being. Please do not hesitate to call the office if you need care or have questions about your care.  Have a wonderful day. With Gratitude, Tereasa Coop, DNP, AGNP-BC

## 2020-07-04 NOTE — Assessment & Plan Note (Signed)
I think anxiety is not well controlled and this is causing weight loss  I suggested therapy but she is more looking for a medication to start with first. I will trial Remeron with close follow up.  Denies SI and HI today in office  

## 2020-07-10 ENCOUNTER — Telehealth (INDEPENDENT_AMBULATORY_CARE_PROVIDER_SITE_OTHER): Payer: Medicaid Other | Admitting: Family Medicine

## 2020-07-10 ENCOUNTER — Other Ambulatory Visit: Payer: Self-pay

## 2020-07-10 ENCOUNTER — Encounter: Payer: Self-pay | Admitting: Family Medicine

## 2020-07-10 DIAGNOSIS — F419 Anxiety disorder, unspecified: Secondary | ICD-10-CM | POA: Diagnosis not present

## 2020-07-10 NOTE — Patient Instructions (Signed)
I appreciate the opportunity to provide you with care for your health and wellness.  Follow up: as scheduled on 07/31/2020  No labs or referrals today  Note for pet therapy provided today   Please call if you have any issues.   Please continue to practice social distancing to keep you, your family, and our community safe.  If you must go out, please wear a mask and practice good handwashing.  It was a pleasure to see you and I look forward to continuing to work together on your health and well-being. Please do not hesitate to call the office if you need care or have questions about your care.  Have a wonderful day. With Gratitude, Tereasa Coop, DNP, AGNP-BC

## 2020-07-10 NOTE — Assessment & Plan Note (Signed)
Started on Remeron for anxiety and weight gain needs. Would like a pet for therapy at home, Note provided today Follow up in 3 weeks for wt check

## 2020-07-10 NOTE — Progress Notes (Signed)
Virtual Visit via Telephone Note   This visit type was conducted due to national recommendations for restrictions regarding the COVID-19 Pandemic (e.g. social distancing) in an effort to limit this patient's exposure and mitigate transmission in our community.  Due to her co-morbid illnesses, this patient is at least at moderate risk for complications without adequate follow up.  This format is felt to be most appropriate for this patient at this time.  The patient did not have access to video technology/had technical difficulties with video requiring transitioning to audio format only (telephone).  All issues noted in this document were discussed and addressed.  No physical exam could be performed with this format.    Evaluation Performed:  Follow-up visit  Date:  07/10/2020   ID:  Joyce [name], DOB 02/21/1996, MRN [id number]  Patient Location: Home Provider Location: Office/Clinic   Participants: Nurse for intake and work up; Patient and Provider for Visit and Wrap up  Method of visit: Telephone Location of Patient: Home Location of Provider: Office Consent was obtain for visit over the telephone. Services rendered by provider: Visit was performed via telephone  I verified that I am speaking with the correct person using two identifiers.  PCP:  Freddy Finner, NP   Chief Complaint:  Anxiety and desire to have a pet for therapy   History of Present Illness:    Joyce [name] is a 25 y.o. female with history of anxiety which is complicated due to  OCD. She has had a hard time with keep weight on and recently was started on a medication to help with this and anxiety. She is presenting today for a phone visit to discuss the need for a letter so she can have her dog for pet therapy. She reports she is happier and calmer when she is in the present of a pet and has noticed this when around other peoples animals. Her apartment complex requires a note to verify this pet is for health  care measures.   The patient does not have symptoms concerning for COVID-19 infection (fever, chills, cough, or new shortness of breath).   Past Medical, Surgical, Social History, Allergies, and Medications have been Reviewed.  Past Medical History:  Diagnosis Date  . Allergy    seasonal  . Anxiety   . Elevated blood-pressure reading without diagnosis of hypertension 12/26/2014  . Elevated serum creatinine 12/26/2014  . Gestational hypertension 02/12/2015  . H/O acute renal failure 12/26/2014  . Heart murmur    was told that she had beginning stages  . Migraine   . Post-viral cough syndrome 06/20/2019  . Renal dysfunction   . Renal dysfunction 12/26/2014  . S/P thyroid surgery 12/26/2014  . Severe preeclampsia   . Short interval between pregnancies complicating pregnancy in third trimester, antepartum   . Substance abuse (HCC)    uses marijuanna  . Tension headache 11/11/2018  . Thyroid disease    right side removed  . Underweight 12/26/2014   Past Surgical History:  Procedure Laterality Date  . THYROID SURGERY       Current Meds  Medication Sig  . hydrochlorothiazide (HYDRODIURIL) 25 MG tablet Take 1 tablet (25 mg total) by mouth daily.  Marland Kitchen lisinopril (ZESTRIL) 10 MG tablet Take 1 tablet (10 mg total) by mouth daily.  . medroxyPROGESTERone Acetate 150 MG/ML SUSY Inject 1 mL (150 mg total) into the muscle every 3 (three) months.  . mirtazapine (REMERON) 15 MG tablet Take 1 tablet (15  mg total) by mouth at bedtime.     Allergies:   Patient has no known allergies.   ROS:   Please see the history of present illness.    All other systems reviewed and are negative.   Labs/Other Tests and Data Reviewed:    Recent Labs: 03/07/2020: ALT 8; BUN 12; Creatinine, Ser 1.46; Hemoglobin 11.5; Platelets 167; Potassium 4.2; Sodium 141   Recent Lipid Panel No results found for: CHOL, TRIG, HDL, CHOLHDL, LDLCALC, LDLDIRECT  Wt Readings from Last 3 Encounters:  07/10/20 130 lb (59 kg)   07/04/20 130 lb (59 kg)  06/25/20 132 lb (59.9 kg)     Objective:    Vital Signs:  Ht 5\' 9"  (1.753 m)   Wt 130 lb (59 kg)   LMP 06/18/2020   BMI 19.20 kg/m    VITAL SIGNS:  reviewed GEN:  no acute distress RESPIRATORY:  no shortness of breath in conversation PSYCH:  normal affect  ASSESSMENT & PLAN:    1. Anxiety New meds started Pet therapy started- note provided today   Time:   Today, I have spent 5 minutes with the patient with telehealth technology discussing the above problems.     Medication Adjustments/Labs and Tests Ordered: Current medicines are reviewed at length with the patient today.  Concerns regarding medicines are outlined above.   Tests Ordered: No orders of the defined types were placed in this encounter.   Medication Changes: No orders of the defined types were placed in this encounter.    Disposition:  Follow up as schedule 07/31/2020 Signed, 09/28/2020, NP  07/10/2020 8:57 AM     09/07/2020 [facility]

## 2020-07-31 ENCOUNTER — Ambulatory Visit: Payer: Medicaid Other | Admitting: Internal Medicine

## 2020-08-01 ENCOUNTER — Other Ambulatory Visit: Payer: Self-pay

## 2020-08-01 ENCOUNTER — Ambulatory Visit: Payer: Medicaid Other | Admitting: Internal Medicine

## 2020-08-01 ENCOUNTER — Encounter: Payer: Self-pay | Admitting: Internal Medicine

## 2020-08-01 VITALS — BP 125/88 | HR 84 | Temp 98.8°F | Resp 18 | Ht 69.0 in | Wt 128.0 lb

## 2020-08-01 DIAGNOSIS — F419 Anxiety disorder, unspecified: Secondary | ICD-10-CM

## 2020-08-01 DIAGNOSIS — E441 Mild protein-calorie malnutrition: Secondary | ICD-10-CM | POA: Diagnosis not present

## 2020-08-01 DIAGNOSIS — E873 Alkalosis: Secondary | ICD-10-CM

## 2020-08-01 MED ORDER — MIRTAZAPINE 15 MG PO TABS: 15.0000 mg | ORAL_TABLET | Freq: Every day | ORAL | 2 refills | Status: DC

## 2020-08-01 NOTE — Assessment & Plan Note (Signed)
Continue Remeron for now Had recent spell of anxiety at home due to argument with her mother, currently stable

## 2020-08-01 NOTE — Patient Instructions (Signed)
Please continue taking medications as prescribed.  Please avoid skipping any meals.  Please take at least 1.5 liters of fluid in a day, preferable water, fresh fruit juice. Electrolyte drinks are acceptable alternative up to certain limit.  Please get fasting blood tests done before the next visit.

## 2020-08-01 NOTE — Assessment & Plan Note (Signed)
Mostly related to anxiety Reports gaining about 5 lbs, but recently had argument with her mother and did not eat for few days 2 lbs less weight compared to previous visit Advised to avoid skipping meals Proper hydration advised

## 2020-08-01 NOTE — Assessment & Plan Note (Signed)
Previous BMP suggests metabolic alkalosis with CKD stage 3a Could be worsening due to dehydration Check BMP and UA before next visit Will obatin Renal US if persistent elevated S. Cr. F/u with Nephrology

## 2020-08-01 NOTE — Progress Notes (Signed)
Established Patient Office Visit  Subjective:  Patient ID: Joyce [name], female    DOB: 1996-04-25  Age: 25 y.o. MRN: [id number]  CC:  Chief Complaint  Patient presents with  . Follow-up    4 week follow up wt check     HPI Joyce [name] is a 25 year old female with PMH of HTN, CKD stage 3 and malnutrition who presents for follow up of weight since starting Remeron.  She states that she has been tolerating Remeron well and has improved sleep with it. Her appetite has improved as well, and she had gained about 5 lbs. since staring Remeron. But she had an argument with her mother and did not eat for few days, and has actually lost 2 lbs. since the last visit. She denies any binge eating spells, intentional vomiting/retching, diarrhea or polyuria. She has stopped taking soft drinks. She is willing to gain weight.  Past Medical History:  Diagnosis Date  . Allergy    seasonal  . Anxiety   . Elevated blood-pressure reading without diagnosis of hypertension 12/26/2014  . Elevated serum creatinine 12/26/2014  . Gestational hypertension 02/12/2015  . H/O acute renal failure 12/26/2014  . Heart murmur    was told that she had beginning stages  . Migraine   . Post-viral cough syndrome 06/20/2019  . Renal dysfunction   . Renal dysfunction 12/26/2014  . S/P thyroid surgery 12/26/2014  . Severe preeclampsia   . Short interval between pregnancies complicating pregnancy in third trimester, antepartum   . Substance abuse (HCC)    uses marijuanna  . Tension headache 11/11/2018  . Thyroid disease    right side removed  . Underweight 12/26/2014    Past Surgical History:  Procedure Laterality Date  . THYROID SURGERY      Family History  Problem Relation Age of Onset  . Hypertension Mother   . Birth defects Paternal Grandmother   . Hypertension Maternal Grandmother     Social History   Socioeconomic History  . Marital status: Single    Spouse name: Not on file  . Number of  children: 2  . Years of education: Not on file  . Highest education level: High school graduate  Occupational History  . Not on file  Tobacco Use  . Smoking status: Former Smoker    Types: Cigarettes  . Smokeless tobacco: Never Used  Vaping Use  . Vaping Use: Never used  Substance and Sexual Activity  . Alcohol use: Yes    Comment: occasion  . Drug use: Yes    Frequency: 1.0 times per week    Types: Marijuana  . Sexual activity: Yes    Birth control/protection: Injection  Other Topics Concern  . Not on file  Social History Narrative   Lives with two kids   Auberi: 5 (full time)    Jaxon: 4 (half and half) [place]    Fathers are in the lives, help take care of children      Enjoys: playing with kids, going out to eat, movies and music      Diet: Does not feel like she eats as good as she should. Does eat veggies and fruits; will drink v8   Caffeine: pepis -4-5 cans a day, has slowed down    Water: gallon daily      Wear seat belt   Does not wear sunscreen   Smoke and carbon monoxide detectors   Does not use phone while driving, unless it is a call.  Social Determinants of Health   Financial Resource Strain: Low Risk   . Difficulty of Paying Living Expenses: Not very hard  Food Insecurity: No Food Insecurity  . Worried About Programme researcher, broadcasting/film/video in the Last Year: Never true  . Ran Out of Food in the Last Year: Never true  Transportation Needs: No Transportation Needs  . Lack of Transportation (Medical): No  . Lack of Transportation (Non-Medical): No  Physical Activity: Sufficiently Active  . Days of Exercise per Week: 5 days  . Minutes of Exercise per Session: 30 min  Stress: No Stress Concern Present  . Feeling of Stress : Not at all  Social Connections: Socially Isolated  . Frequency of Communication with Friends and Family: More than three times a week  . Frequency of Social Gatherings with Friends and Family: Twice a week  . Attends Religious Services: Never  .  Active Member of Clubs or Organizations: No  . Attends Banker Meetings: Never  . Marital Status: Never married  Intimate Partner Violence: Not At Risk  . Fear of Current or Ex-Partner: No  . Emotionally Abused: No  . Physically Abused: No  . Sexually Abused: No    Outpatient Medications Prior to Visit  Medication Sig Dispense Refill  . hydrochlorothiazide (HYDRODIURIL) 25 MG tablet Take 1 tablet (25 mg total) by mouth daily. 90 tablet 0  . lisinopril (ZESTRIL) 10 MG tablet Take 1 tablet (10 mg total) by mouth daily. 90 tablet 0  . medroxyPROGESTERone Acetate 150 MG/ML SUSY Inject 1 mL (150 mg total) into the muscle every 3 (three) months. 1 mL 3  . mirtazapine (REMERON) 15 MG tablet Take 1 tablet (15 mg total) by mouth at bedtime. 30 tablet 1   No facility-administered medications prior to visit.    No Known Allergies  ROS Review of Systems  Constitutional: Negative for chills and fever.  HENT: Negative for congestion, sinus pressure, sinus pain and sore throat.   Eyes: Negative for pain and discharge.  Respiratory: Negative for cough and shortness of breath.   Cardiovascular: Negative for chest pain and palpitations.  Gastrointestinal: Negative for abdominal pain, constipation, diarrhea, nausea and vomiting.  Endocrine: Negative for polydipsia and polyuria.  Genitourinary: Negative for dysuria and hematuria.  Musculoskeletal: Negative for neck pain and neck stiffness.  Skin: Negative for rash.  Neurological: Negative for dizziness and weakness.  Psychiatric/Behavioral: Negative for agitation and behavioral problems. The patient is nervous/anxious.       Objective:    Physical Exam Vitals reviewed.  Constitutional:      General: She is not in acute distress.    Appearance: She is not diaphoretic.  HENT:     Head: Normocephalic and atraumatic.     Nose: Nose normal. No congestion.     Mouth/Throat:     Mouth: Mucous membranes are moist.     Pharynx: No  posterior oropharyngeal erythema.  Eyes:     General: No scleral icterus.    Extraocular Movements: Extraocular movements intact.     Pupils: Pupils are equal, round, and reactive to light.  Cardiovascular:     Rate and Rhythm: Normal rate and regular rhythm.     Pulses: Normal pulses.     Heart sounds: Normal heart sounds. No murmur heard.   Pulmonary:     Breath sounds: Normal breath sounds. No wheezing or rales.  Musculoskeletal:     Cervical back: Neck supple. No tenderness.     Right lower leg: No  edema.     Left lower leg: No edema.  Skin:    General: Skin is warm.     Findings: No rash.  Neurological:     General: No focal deficit present.     Mental Status: She is alert and oriented to person, place, and time.  Psychiatric:        Mood and Affect: Mood normal.        Behavior: Behavior normal.     BP 125/88 (BP Location: Right Arm, Patient Position: Sitting, Cuff Size: Normal)   Pulse 84   Temp 98.8 F (37.1 C) (Oral)   Resp 18   Ht 5\' 9"  (1.753 m)   Wt 128 lb (58.1 kg)   SpO2 99%   BMI 18.90 kg/m  Wt Readings from Last 3 Encounters:  08/01/20 128 lb (58.1 kg)  07/10/20 130 lb (59 kg)  07/04/20 130 lb (59 kg)     There are no preventive care reminders to display for this patient.  There are no preventive care reminders to display for this patient.  Lab Results  Component Value Date   TSH 1.25 01/05/2019   Lab Results  Component Value Date   WBC 5.3 03/07/2020   HGB 11.5 03/07/2020   HCT 37.1 03/07/2020   MCV 91 03/07/2020   PLT 167 03/07/2020   Lab Results  Component Value Date   NA 141 03/07/2020   K 4.2 03/07/2020   CO2 19 (L) 03/07/2020   GLUCOSE 86 03/07/2020   BUN 12 03/07/2020   CREATININE 1.46 (H) 03/07/2020   BILITOT 0.4 03/07/2020   ALKPHOS 86 03/07/2020   AST 19 03/07/2020   ALT 8 03/07/2020   PROT 6.0 03/07/2020   ALBUMIN 4.3 03/07/2020   CALCIUM 9.3 03/07/2020   ANIONGAP 9 10/13/2019   No results found for: CHOL No  results found for: HDL No results found for: LDLCALC No results found for: TRIG No results found for: CHOLHDL No results found for: 10/15/2019    Assessment & Plan:   Problem List Items Addressed This Visit      Other   Anxiety    Continue Remeron for now Had recent spell of anxiety at home due to argument with her mother, currently stable      Relevant Medications   mirtazapine (REMERON) 15 MG tablet   Malnutrition of mild degree (HCC) - Primary    Mostly related to anxiety Reports gaining about 5 lbs, but recently had argument with her mother and did not eat for few days 2 lbs less weight compared to previous visit Advised to avoid skipping meals Proper hydration advised      Relevant Medications   mirtazapine (REMERON) 15 MG tablet   Other Relevant Orders   Basic Metabolic Panel (BMET)   Urinalysis, Routine w reflex microscopic   Metabolic alkalosis    Previous BMP suggests metabolic alkalosis with CKD stage 3a Could be worsening due to dehydration Check BMP and UA before next visit Will obatin Renal [id number] if persistent elevated S. Cr. F/u with Nephrology      Relevant Orders   Basic Metabolic Panel (BMET)      Meds ordered this encounter  Medications  . mirtazapine (REMERON) 15 MG tablet    Sig: Take 1 tablet (15 mg total) by mouth at bedtime.    Dispense:  30 tablet    Refill:  2    Follow-up: Return in about 2 months (around 09/29/2020) for Weight check and f/u BMP.  Orson Rho K Novali Vollman, MD 

## 2020-08-08 ENCOUNTER — Ambulatory Visit: Payer: Medicaid Other

## 2020-08-13 ENCOUNTER — Ambulatory Visit: Payer: Medicaid Other

## 2020-08-14 ENCOUNTER — Ambulatory Visit (INDEPENDENT_AMBULATORY_CARE_PROVIDER_SITE_OTHER): Payer: Medicaid Other

## 2020-08-14 ENCOUNTER — Ambulatory Visit: Payer: Medicaid Other

## 2020-08-14 ENCOUNTER — Other Ambulatory Visit: Payer: Self-pay

## 2020-08-14 DIAGNOSIS — Z3042 Encounter for surveillance of injectable contraceptive: Secondary | ICD-10-CM | POA: Diagnosis not present

## 2020-08-14 LAB — POCT URINE PREGNANCY: Preg Test, Ur: NEGATIVE

## 2020-08-14 MED ORDER — MEDROXYPROGESTERONE ACETATE 150 MG/ML IM SUSP: 150.0000 mg | Freq: Once | INTRAMUSCULAR | Status: AC

## 2020-08-14 MED ORDER — MEDROXYPROGESTERONE ACETATE 150 MG/ML IM SUSP
150.0000 mg | Freq: Once | INTRAMUSCULAR | Status: AC
  Administered 2020-08-14: 150 mg via INTRAMUSCULAR

## 2020-08-14 NOTE — Progress Notes (Signed)
Patient presents today for Depo Provera injection within dates. Given IM RUOQ. Patient tolerated well. 

## 2020-08-23 ENCOUNTER — Telehealth: Payer: Self-pay

## 2020-08-23 NOTE — Telephone Encounter (Signed)
Pt calling; has questions about BV.  [phone]

## 2020-08-23 NOTE — Telephone Encounter (Signed)
Had BV sx 12/21. Never did flagyl because sx improved. Now having vaginal pain/possible tear, irritation. Started flagyl without sx change. Pt aware she needs appt to be eval, rule out HSV vs yeast. Doing sitz baths, can try NSAIDs.

## 2020-08-24 ENCOUNTER — Other Ambulatory Visit (HOSPITAL_COMMUNITY)
Admission: RE | Admit: 2020-08-24 | Discharge: 2020-08-24 | Disposition: A | Discharge status: Home / Self Care | Payer: Managed Care, Other (non HMO) | Source: Ambulatory Visit | Attending: Advanced Practice Midwife | Admitting: Advanced Practice Midwife

## 2020-08-24 ENCOUNTER — Other Ambulatory Visit: Payer: Self-pay

## 2020-08-24 ENCOUNTER — Ambulatory Visit (INDEPENDENT_AMBULATORY_CARE_PROVIDER_SITE_OTHER): Payer: Medicaid Other | Admitting: Advanced Practice Midwife

## 2020-08-24 ENCOUNTER — Encounter: Payer: Self-pay | Admitting: Advanced Practice Midwife

## 2020-08-24 VITALS — BP 120/80 | Ht 69.0 in | Wt 133.0 lb

## 2020-08-24 DIAGNOSIS — Z113 Encounter for screening for infections with a predominantly sexual mode of transmission: Secondary | ICD-10-CM

## 2020-08-24 DIAGNOSIS — S30814A Abrasion of vagina and vulva, initial encounter: Secondary | ICD-10-CM

## 2020-08-24 DIAGNOSIS — N898 Other specified noninflammatory disorders of vagina: Secondary | ICD-10-CM

## 2020-08-24 NOTE — Progress Notes (Signed)
Patient ID: Joyce [name], female   DOB: Dec 19, 1995, 26 y.o.   MRN: [id number]  Reason for Consult: Pelvic Pain   Subjective:  HPI:  Joyce [name] is a 25 y.o. female with vaginal pain and tears in the vagina following recent intercourse. She had intercourse for the first time since December, last weekend. She describes the experience as "rough". She did not use a condom. Her partner states he tested negative for STDs 3 weeks ago and he is not having any symptoms. She uses Depo for birth control.   She has pain and swelling around her clitoris and inside her vagina. She has noticed white areas that appear to be open skin in the healing stage. She has mild itching. She denies discharge or odor. The area is very sensitive to touch. She is taking Clindamycin previously prescribed for BV back in December- she didn't take the medicine then. She tried AZO yeast. She has been soaking in the tub with table salt. She uses vagisil pH soap. She does not douche. She requests testing for vaginitis, STDs and HSV if the sores could possibly be HSV.   Past Medical History:  Diagnosis Date  . Allergy    seasonal  . Anxiety   . Elevated blood-pressure reading without diagnosis of hypertension 12/26/2014  . Elevated serum creatinine 12/26/2014  . Gestational hypertension 02/12/2015  . H/O acute renal failure 12/26/2014  . Heart murmur    was told that she had beginning stages  . Migraine   . Post-viral cough syndrome 06/20/2019  . Renal dysfunction   . Renal dysfunction 12/26/2014  . S/P thyroid surgery 12/26/2014  . Severe preeclampsia   . Short interval between pregnancies complicating pregnancy in third trimester, antepartum   . Substance abuse (HCC)    uses marijuanna  . Tension headache 11/11/2018  . Thyroid disease    right side removed  . Underweight 12/26/2014   Family History  Problem Relation Age of Onset  . Hypertension Mother   . Birth defects Paternal Grandmother   . Hypertension  Maternal Grandmother    Past Surgical History:  Procedure Laterality Date  . THYROID SURGERY      Short Social History:  Social History   Tobacco Use  . Smoking status: Former Smoker    Types: Cigarettes  . Smokeless tobacco: Never Used  Substance Use Topics  . Alcohol use: Yes    Comment: occasion    No Known Allergies  Current Outpatient Medications  Medication Sig Dispense Refill  . hydrochlorothiazide (HYDRODIURIL) 25 MG tablet Take 1 tablet (25 mg total) by mouth daily. 90 tablet 0  . lisinopril (ZESTRIL) 10 MG tablet Take 1 tablet (10 mg total) by mouth daily. 90 tablet 0  . mirtazapine (REMERON) 15 MG tablet Take 1 tablet (15 mg total) by mouth at bedtime. 30 tablet 2  . clindamycin (CLEOCIN) 300 MG capsule Take 300 mg by mouth 2 (two) times daily.     Current Facility-Administered Medications  Medication Dose Route Frequency Provider Last Rate Last Admin  . medroxyPROGESTERone (DEPO-PROVERA) injection 150 mg  150 mg Intramuscular Once Tresea Mall, CNM        Review of Systems  Constitutional: Negative for chills and fever.  HENT: Negative for congestion, ear discharge, ear pain, hearing loss, sinus pain and sore throat.   Eyes: Negative for blurred vision and double vision.  Respiratory: Negative for cough, shortness of breath and wheezing.   Cardiovascular: Negative for chest pain, palpitations and  leg swelling.  Gastrointestinal: Negative for abdominal pain, blood in stool, constipation, diarrhea, heartburn, melena, nausea and vomiting.  Genitourinary: Negative for dysuria, flank pain, frequency, hematuria and urgency.       Positive for vaginal pain, tears, irritation, itching  Musculoskeletal: Negative for back pain, joint pain and myalgias.  Skin: Negative for itching and rash.  Neurological: Negative for dizziness, tingling, tremors, sensory change, speech change, focal weakness, seizures, loss of consciousness, weakness and headaches.   Endo/Heme/Allergies: Negative for environmental allergies. Does not bruise/bleed easily.  Psychiatric/Behavioral: Negative for depression, hallucinations, memory loss, substance abuse and suicidal ideas. The patient is not nervous/anxious and does not have insomnia.         Objective:  Objective   Vitals:   08/24/20 1057  BP: 120/80  Weight: 133 lb (60.3 kg)  Height: 5\' 9"  (1.753 m)   Body mass index is 19.64 kg/m. Constitutional: Well nourished, well developed female in no acute distress.  HEENT: normal Skin: Warm and dry.    Extremity: no edema  Respiratory:  Normal respiratory effort Neuro: DTRs 2+, Cranial nerves grossly intact Psych: Alert and Oriented x3. No memory deficits. Normal mood and affect.  MS: normal gait, normal bilateral lower extremity ROM/strength/stability.  Pelvic exam: (female chaperone present) is not limited by body habitus EGBUS: swelling of clitoral hood, external abrasion appears white and healing Vagina: within normal limits and with normal mucosa, multiple areas of abrasions just inside vagina in healing stages, very tender to palpation Cervix: not evaluated, pink noted on Aptima swab   Assessment/Plan:     25 y.o. G2 P69 female with vaginal abrasions following intercourse, not highly suspicious for STDs/HSV- will check out of an abundance of caution  Aptima; vaginitis/STDs HSV culture Follow up as needed after labs result Sea Salt tub soaks daily Aloe Vera gel if tolerated Apple Cider Vinegar tub soaks if tolerated Avoid body care products that are perfumed/chemicals   P100 CNM [facility] 08/24/2020, 11:52 AM

## 2020-08-27 ENCOUNTER — Other Ambulatory Visit: Payer: Self-pay | Admitting: Advanced Practice Midwife

## 2020-08-27 DIAGNOSIS — B009 Herpesviral infection, unspecified: Secondary | ICD-10-CM

## 2020-08-27 DIAGNOSIS — B9689 Other specified bacterial agents as the cause of diseases classified elsewhere: Secondary | ICD-10-CM

## 2020-08-27 LAB — CERVICOVAGINAL ANCILLARY ONLY
Bacterial Vaginitis (gardnerella): POSITIVE — AB
Candida Glabrata: NEGATIVE
Candida Vaginitis: NEGATIVE
Chlamydia: NEGATIVE
Comment: NEGATIVE
Comment: NEGATIVE
Comment: NEGATIVE
Comment: NEGATIVE
Comment: NEGATIVE
Comment: NORMAL
Neisseria Gonorrhea: NEGATIVE
Trichomonas: NEGATIVE

## 2020-08-27 LAB — HERPES SIMPLEX VIRUS CULTURE

## 2020-08-27 MED ORDER — METRONIDAZOLE 500 MG PO TABS: 500.0000 mg | ORAL_TABLET | Freq: Two times a day (BID) | ORAL | 0 refills | Status: AC

## 2020-08-27 MED ORDER — VALACYCLOVIR HCL 1 G PO TABS: 1000.0000 mg | ORAL_TABLET | Freq: Two times a day (BID) | ORAL | 0 refills | Status: AC

## 2020-08-27 NOTE — Progress Notes (Signed)
Rx metronidazole sent to treat BV. 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 Message sent to patient.

## 2020-08-28 NOTE — Telephone Encounter (Signed)
Pt had questions about the HSV , she was worried that she could give it to them by letting them drink after her. I advised pt if she never had cold sores on her mouth she could not give it to her kids,

## 2020-08-31 NOTE — Telephone Encounter (Signed)
Please advise 

## 2020-09-24 ENCOUNTER — Other Ambulatory Visit: Payer: Self-pay | Admitting: Family Medicine

## 2020-09-24 ENCOUNTER — Telehealth: Payer: Medicaid Other | Admitting: Internal Medicine

## 2020-09-24 ENCOUNTER — Encounter: Payer: Self-pay | Admitting: Internal Medicine

## 2020-09-24 DIAGNOSIS — R300 Dysuria: Secondary | ICD-10-CM | POA: Diagnosis not present

## 2020-09-24 DIAGNOSIS — I10 Essential (primary) hypertension: Secondary | ICD-10-CM

## 2020-09-24 DIAGNOSIS — N3000 Acute cystitis without hematuria: Secondary | ICD-10-CM | POA: Diagnosis not present

## 2020-09-24 LAB — POCT URINALYSIS DIP (CLINITEK)
Bilirubin, UA: NEGATIVE
Glucose, UA: NEGATIVE mg/dL
Ketones, POC UA: NEGATIVE mg/dL
Nitrite, UA: POSITIVE — AB
POC PROTEIN,UA: >=300 — AB
Spec Grav, UA: >=1.03 — AB (ref 1.010–1.025)
Urobilinogen, UA: 0.2 E.U./dL
pH, UA: 6 (ref 5.0–8.0)

## 2020-09-24 MED ORDER — SULFAMETHOXAZOLE-TRIMETHOPRIM 800-160 MG PO TABS: 1.0000 | ORAL_TABLET | Freq: Two times a day (BID) | ORAL | 0 refills | Status: DC

## 2020-09-24 NOTE — Patient Instructions (Signed)
Urinary Tract Infection, Adult A urinary tract infection (UTI) is an infection of any part of the urinary tract. The urinary tract includes:  The kidneys.  The ureters.  The bladder.  The urethra. These organs make, store, and get rid of pee (urine) in the body. What are the causes? This infection is caused by germs (bacteria) in your genital area. These germs grow and cause swelling (inflammation) of your urinary tract. What increases the risk? The following factors may make you more likely to develop this condition:  Using a small, thin tube (catheter) to drain pee.  Not being able to control when you pee or poop (incontinence).  Being female. If you are female, these things can increase the risk: ? Using these methods to prevent pregnancy:  A medicine that kills sperm (spermicide).  A device that blocks sperm (diaphragm). ? Having low levels of a female hormone (estrogen). ? Being pregnant. You are more likely to develop this condition if:  You have genes that add to your risk.  You are sexually active.  You take antibiotic medicines.  You have trouble peeing because of: ? A prostate that is bigger than normal, if you are female. ? A blockage in the part of your body that drains pee from the bladder. ? A kidney stone. ? A nerve condition that affects your bladder. ? Not getting enough to drink. ? Not peeing often enough.  You have other conditions, such as: ? Diabetes. ? A weak disease-fighting system (immune system). ? Sickle cell disease. ? Gout. ? Injury of the spine. What are the signs or symptoms? Symptoms of this condition include:  Needing to pee right away.  Peeing small amounts often.  Pain or burning when peeing.  Blood in the pee.  Pee that smells bad or not like normal.  Trouble peeing.  Pee that is cloudy.  Fluid coming from the vagina, if you are female.  Pain in the belly or lower back. Other symptoms include:  Vomiting.  Not  feeling hungry.  Feeling mixed up (confused). This may be the first symptom in older adults.  Being tired and grouchy (irritable).  A fever.  Watery poop (diarrhea). How is this treated?  Taking antibiotic medicine.  Taking other medicines.  Drinking enough water. In some cases, you may need to see a specialist. Follow these instructions at home: Medicines  Take over-the-counter and prescription medicines only as told by your doctor.  If you were prescribed an antibiotic medicine, take it as told by your doctor. Do not stop taking it even if you start to feel better. General instructions  Make sure you: ? Pee until your bladder is empty. ? Do not hold pee for a long time. ? Empty your bladder after sex. ? Wipe from front to back after peeing or pooping if you are a female. Use each tissue one time when you wipe.  Drink enough fluid to keep your pee pale yellow.  Keep all follow-up visits.   Contact a doctor if:  You do not get better after 1-2 days.  Your symptoms go away and then come back. Get help right away if:  You have very bad back pain.  You have very bad pain in your lower belly.  You have a fever.  You have chills.  You feeling like you will vomit or you vomit. Summary  A urinary tract infection (UTI) is an infection of any part of the urinary tract.  This condition is caused by   germs in your genital area.  There are many risk factors for a UTI.  Treatment includes antibiotic medicines.  Drink enough fluid to keep your pee pale yellow. This information is not intended to replace advice given to you by your health care provider. Make sure you discuss any questions you have with your health care provider. Document Revised: 01/27/2020 Document Reviewed: 01/27/2020 Elsevier Patient Education  2021 Elsevier Inc.  

## 2020-09-24 NOTE — Progress Notes (Signed)
Virtual Visit via Telephone Note   This visit type was conducted due to national recommendations for restrictions regarding the COVID-19 Pandemic (e.g. social distancing) in an effort to limit this patient's exposure and mitigate transmission in our community.  Due to her co-morbid illnesses, this patient is at least at moderate risk for complications without adequate follow up.  This format is felt to be most appropriate for this patient at this time.  The patient did not have access to video technology/had technical difficulties with video requiring transitioning to audio format only (telephone).  All issues noted in this document were discussed and addressed.  No physical exam could be performed with this format.  Evaluation Performed:  Follow-up visit  Date:  09/24/2020   ID:  Joyce [name], Joyce [name] 05-12-1996, MRN [id number]  Patient Location: Home Provider Location: Office/Clinic  Location of Patient: Home Location of Provider: Telehealth Consent was obtain for visit to be over via telehealth. I verified that I am speaking with the correct person using two identifiers.  PCP:  Freddy Finner, NP   Chief Complaint:  Dysuria and urinary frequency  History of Present Illness:    Joyce [name] is a 25 y.o. female who has a televisit for c/o dysuria and urinary frequency since yesterday. She denies hematuria or flank pain. Denies fever, chills, nausea or vomiting.  The patient does not have symptoms concerning for COVID-19 infection (fever, chills, cough, or new shortness of breath).   Past Medical, Surgical, Social History, Allergies, and Medications have been Reviewed.  Past Medical History:  Diagnosis Date  . Allergy    seasonal  . Anxiety   . Elevated blood-pressure reading without diagnosis of hypertension 12/26/2014  . Elevated serum creatinine 12/26/2014  . Gestational hypertension 02/12/2015  . H/O acute renal failure 12/26/2014  . Heart murmur    was told that she  had beginning stages  . Migraine   . Post-viral cough syndrome 06/20/2019  . Renal dysfunction   . Renal dysfunction 12/26/2014  . S/P thyroid surgery 12/26/2014  . Severe preeclampsia   . Short interval between pregnancies complicating pregnancy in third trimester, antepartum   . Substance abuse (HCC)    uses marijuanna  . Tension headache 11/11/2018  . Thyroid disease    right side removed  . Underweight 12/26/2014   Past Surgical History:  Procedure Laterality Date  . THYROID SURGERY       Current Meds  Medication Sig  . hydrochlorothiazide (HYDRODIURIL) 25 MG tablet Take 1 tablet (25 mg total) by mouth daily.  Marland Kitchen lisinopril (ZESTRIL) 10 MG tablet Take 1 tablet (10 mg total) by mouth daily.  . mirtazapine (REMERON) 15 MG tablet Take 1 tablet (15 mg total) by mouth at bedtime.  . sulfamethoxazole-trimethoprim (BACTRIM DS) 800-160 MG tablet Take 1 tablet by mouth 2 (two) times daily.   Current Facility-Administered Medications for the 09/24/20 encounter (Video Visit) with Anabel Halon, MD  Medication  . medroxyPROGESTERone (DEPO-PROVERA) injection 150 mg     Allergies:   Patient has no known allergies.   ROS:   Please see the history of present illness.     All other systems reviewed and are negative.   Labs/Other Tests and Data Reviewed:    Recent Labs: 03/07/2020: ALT 8; BUN 12; Creatinine, Ser 1.46; Hemoglobin 11.5; Platelets 167; Potassium 4.2; Sodium 141   Recent Lipid Panel No results found for: CHOL, TRIG, HDL, CHOLHDL, LDLCALC, LDLDIRECT  Wt Readings from Last 3  Encounters:  08/24/20 133 lb (60.3 kg)  08/01/20 128 lb (58.1 kg)  07/10/20 130 lb (59 kg)      ASSESSMENT & PLAN:    UTI UA reviewed Prescribed Bactrim DS Check urine culture and adjust antibiotics if necessary Increase water intake  Time:   Today, I have spent 12 minutes reviewing the chart, including problem list, medications, and with the patient with telehealth technology discussing the  above problems.   Medication Adjustments/Labs and Tests Ordered: Current medicines are reviewed at length with the patient today.  Concerns regarding medicines are outlined above.   Tests Ordered: Orders Placed This Encounter  Procedures  . Urine Culture  . POCT URINALYSIS DIP (CLINITEK)    Medication Changes: Meds ordered this encounter  Medications  . sulfamethoxazole-trimethoprim (BACTRIM DS) 800-160 MG tablet    Sig: Take 1 tablet by mouth 2 (two) times daily.    Dispense:  10 tablet    Refill:  0     Note: This dictation was prepared with Dragon dictation along with smaller phrase technology. Similar sounding words can be transcribed inadequately or may not be corrected upon review. Any transcriptional errors that result from this process are unintentional.      Disposition:  Follow up  Signed, Anabel Halon, MD  09/24/2020 4:07 PM     [facility]

## 2020-09-28 ENCOUNTER — Other Ambulatory Visit: Payer: Self-pay | Admitting: Internal Medicine

## 2020-09-28 DIAGNOSIS — N3000 Acute cystitis without hematuria: Secondary | ICD-10-CM

## 2020-09-28 LAB — URINE CULTURE

## 2020-09-28 MED ORDER — NITROFURANTOIN MONOHYD MACRO 100 MG PO CAPS: 100.0000 mg | ORAL_CAPSULE | Freq: Two times a day (BID) | ORAL | 0 refills | Status: DC

## 2020-10-01 ENCOUNTER — Telehealth: Payer: Self-pay

## 2020-10-01 NOTE — Telephone Encounter (Signed)
Pt notified of lab results with verbal understanding  

## 2020-10-01 NOTE — Telephone Encounter (Signed)
Patient returning call about uti results. [phone]

## 2020-10-03 ENCOUNTER — Ambulatory Visit: Payer: Medicaid Other | Admitting: Internal Medicine

## 2020-10-08 ENCOUNTER — Other Ambulatory Visit: Payer: Self-pay

## 2020-10-08 ENCOUNTER — Encounter: Payer: Self-pay | Admitting: Internal Medicine

## 2020-10-08 ENCOUNTER — Ambulatory Visit: Payer: Medicaid Other | Admitting: Internal Medicine

## 2020-10-08 VITALS — BP 122/77 | HR 75 | Resp 18 | Ht 69.0 in | Wt 132.0 lb

## 2020-10-08 DIAGNOSIS — N3000 Acute cystitis without hematuria: Secondary | ICD-10-CM | POA: Diagnosis not present

## 2020-10-08 DIAGNOSIS — E873 Alkalosis: Secondary | ICD-10-CM

## 2020-10-08 DIAGNOSIS — F419 Anxiety disorder, unspecified: Secondary | ICD-10-CM | POA: Diagnosis not present

## 2020-10-08 DIAGNOSIS — E441 Mild protein-calorie malnutrition: Secondary | ICD-10-CM | POA: Diagnosis not present

## 2020-10-08 DIAGNOSIS — N1831 Chronic kidney disease, stage 3a: Secondary | ICD-10-CM | POA: Diagnosis not present

## 2020-10-08 MED ORDER — NITROFURANTOIN MONOHYD MACRO 100 MG PO CAPS: 100.0000 mg | ORAL_CAPSULE | Freq: Two times a day (BID) | ORAL | 0 refills | Status: DC

## 2020-10-08 MED ORDER — MIRTAZAPINE 15 MG PO TABS: 15.0000 mg | ORAL_TABLET | Freq: Every day | ORAL | 2 refills | Status: DC

## 2020-10-08 MED ORDER — SULFAMETHOXAZOLE-TRIMETHOPRIM 800-160 MG PO TABS: 1.0000 | ORAL_TABLET | Freq: Two times a day (BID) | ORAL | 0 refills | Status: DC

## 2020-10-08 NOTE — Progress Notes (Signed)
Established Patient Office Visit  Subjective:  Patient ID: Joyce [name], female    DOB: [birth year]  Age: 25 y.o. MRN: [id number]  CC:  Chief Complaint  Patient presents with  . Follow-up    2 month follow up weight check     HPI Joyce [name]  is a 25 year old female with PMH of HTN, CKD stage 3 and malnutrition who presents for follow up of weight.  Her weight has improved 4 lbs. compared to last visit. She has run out of Remeron and was not able to get it from [employer]. Refills have been sent again.  She has not had BMP yet for follow up of CKD and metabolic alkalosis. She recently had UTI, which was treated with Macrobid. She is still having mild dysuria. Denies any fever, chills, nausea or vomiting. Denies hematuria, flank or pelvic pain.  Past Medical History:  Diagnosis Date  . Allergy    seasonal  . Anxiety   . Elevated blood-pressure reading without diagnosis of hypertension 12/26/2014  . Elevated serum creatinine 12/26/2014  . Gestational hypertension 02/12/2015  . H/O acute renal failure 12/26/2014  . Heart murmur    was told that she had beginning stages  . Migraine   . Post-viral cough syndrome 06/20/2019  . Renal dysfunction   . Renal dysfunction 12/26/2014  . S/P thyroid surgery 12/26/2014  . Severe preeclampsia   . Short interval between pregnancies complicating pregnancy in third trimester, antepartum   . Substance abuse (HCC)    uses marijuanna  . Tension headache 11/11/2018  . Thyroid disease    right side removed  . Underweight 12/26/2014    Past Surgical History:  Procedure Laterality Date  . THYROID SURGERY      Family History  Problem Relation Age of Onset  . Hypertension Mother   . Birth defects Paternal Grandmother   . Hypertension Maternal Grandmother     Social History   Socioeconomic History  . Marital status: Single    Spouse name: Not on file  . Number of children: 2  . Years of education: Not on file  . Highest education  level: High school graduate  Occupational History  . Not on file  Tobacco Use  . Smoking status: Former Smoker    Types: Cigarettes  . Smokeless tobacco: Never Used  Vaping Use  . Vaping Use: Never used  Substance and Sexual Activity  . Alcohol use: Yes    Comment: occasion  . Drug use: Yes    Frequency: 1.0 times per week    Types: Marijuana  . Sexual activity: Yes    Birth control/protection: Injection  Other Topics Concern  . Not on file  Social History Narrative   Lives with two kids   Auberi: 5 (full time)    Jaxon: 4 (half and half) [place]    Fathers are in the lives, help take care of children      Enjoys: playing with kids, going out to eat, movies and music      Diet: Does not feel like she eats as good as she should. Does eat veggies and fruits; will drink v8   Caffeine: pepis -4-5 cans a day, has slowed down    Water: gallon daily      Wear seat belt   Does not wear sunscreen   Smoke and carbon monoxide detectors   Does not use phone while driving, unless it is a call.    Social Determinants of Health  Financial Resource Strain: Low Risk   . Difficulty of Paying Living Expenses: Not very hard  Food Insecurity: No Food Insecurity  . Worried About Programme researcher, broadcasting/film/video in the Last Year: Never true  . Ran Out of Food in the Last Year: Never true  Transportation Needs: No Transportation Needs  . Lack of Transportation (Medical): No  . Lack of Transportation (Non-Medical): No  Physical Activity: Sufficiently Active  . Days of Exercise per Week: 5 days  . Minutes of Exercise per Session: 30 min  Stress: No Stress Concern Present  . Feeling of Stress : Not at all  Social Connections: Socially Isolated  . Frequency of Communication with Friends and Family: More than three times a week  . Frequency of Social Gatherings with Friends and Family: Twice a week  . Attends Religious Services: Never  . Active Member of Clubs or Organizations: No  . Attends Tax inspector Meetings: Never  . Marital Status: Never married  Intimate Partner Violence: Not At Risk  . Fear of Current or Ex-Partner: No  . Emotionally Abused: No  . Physically Abused: No  . Sexually Abused: No    Outpatient Medications Prior to Visit  Medication Sig Dispense Refill  . hydrochlorothiazide (HYDRODIURIL) 25 MG tablet TAKE 1 TABLET(25 MG) BY MOUTH DAILY 90 tablet 0  . lisinopril (ZESTRIL) 10 MG tablet TAKE 1 TABLET(10 MG) BY MOUTH DAILY 90 tablet 0  . mirtazapine (REMERON) 15 MG tablet Take 1 tablet (15 mg total) by mouth at bedtime. (Patient not taking: Reported on 10/08/2020) 30 tablet 2  . nitrofurantoin, macrocrystal-monohydrate, (MACROBID) 100 MG capsule Take 1 capsule (100 mg total) by mouth 2 (two) times daily. (Patient not taking: Reported on 10/08/2020) 10 capsule 0  . sulfamethoxazole-trimethoprim (BACTRIM DS) 800-160 MG tablet Take 1 tablet by mouth 2 (two) times daily. (Patient not taking: Reported on 10/08/2020) 10 tablet 0   Facility-Administered Medications Prior to Visit  Medication Dose Route Frequency Provider Last Rate Last Admin  . medroxyPROGESTERone (DEPO-PROVERA) injection 150 mg  150 mg Intramuscular Once Tresea Mall, CNM        No Known Allergies  ROS Review of Systems  Constitutional: Negative for chills and fever.  HENT: Negative for congestion, sinus pressure, sinus pain and sore throat.   Eyes: Negative for pain and discharge.  Respiratory: Negative for cough and shortness of breath.   Cardiovascular: Negative for chest pain and palpitations.  Gastrointestinal: Negative for abdominal pain, constipation, diarrhea, nausea and vomiting.  Endocrine: Negative for polydipsia and polyuria.  Genitourinary: Positive for dysuria. Negative for hematuria.  Musculoskeletal: Negative for neck pain and neck stiffness.  Skin: Negative for rash.  Neurological: Negative for dizziness and weakness.  Psychiatric/Behavioral: Negative for agitation and  behavioral problems. The patient is nervous/anxious.       Objective:    Physical Exam Vitals reviewed.  Constitutional:      General: She is not in acute distress.    Appearance: She is not diaphoretic.  HENT:     Head: Normocephalic and atraumatic.     Nose: Nose normal. No congestion.     Mouth/Throat:     Mouth: Mucous membranes are moist.     Pharynx: No posterior oropharyngeal erythema.  Eyes:     General: No scleral icterus.    Extraocular Movements: Extraocular movements intact.     Pupils: Pupils are equal, round, and reactive to light.  Cardiovascular:     Rate and Rhythm: Normal rate and  regular rhythm.     Pulses: Normal pulses.     Heart sounds: Normal heart sounds. No murmur heard.   Pulmonary:     Breath sounds: Normal breath sounds. No wheezing or rales.  Musculoskeletal:     Cervical back: Neck supple. No tenderness.     Right lower leg: No edema.     Left lower leg: No edema.  Skin:    General: Skin is warm.     Findings: No rash.  Neurological:     General: No focal deficit present.     Mental Status: She is alert and oriented to person, place, and time.  Psychiatric:        Mood and Affect: Mood normal.        Behavior: Behavior normal.     BP 122/77 (BP Location: Right Arm, Patient Position: Sitting, Cuff Size: Normal)   Pulse 75   Resp 18   Ht 5\' 9"  (1.753 m)   Wt 132 lb (59.9 kg)   SpO2 97%   BMI 19.49 kg/m  Wt Readings from Last 3 Encounters:  10/08/20 132 lb (59.9 kg)  08/24/20 133 lb (60.3 kg)  08/01/20 128 lb (58.1 kg)     Health Maintenance Due  Topic Date Due  . HPV VACCINES (1 - 2-dose series) Never done       Topic Date Due  . HPV VACCINES (1 - 2-dose series) Never done    Lab Results  Component Value Date   TSH 1.25 01/05/2019   Lab Results  Component Value Date   WBC 5.3 03/07/2020   HGB 11.5 03/07/2020   HCT 37.1 03/07/2020   MCV 91 03/07/2020   PLT 167 03/07/2020   Lab Results  Component Value Date    NA 141 03/07/2020   K 4.2 03/07/2020   CO2 19 (L) 03/07/2020   GLUCOSE 86 03/07/2020   BUN 12 03/07/2020   CREATININE 1.46 (H) 03/07/2020   BILITOT 0.4 03/07/2020   ALKPHOS 86 03/07/2020   AST 19 03/07/2020   ALT 8 03/07/2020   PROT 6.0 03/07/2020   ALBUMIN 4.3 03/07/2020   CALCIUM 9.3 03/07/2020   ANIONGAP 9 10/13/2019   No results found for: CHOL No results found for: HDL No results found for: LDLCALC No results found for: TRIG No results found for: CHOLHDL No results found for: 10/15/2019    Assessment & Plan:   Problem List Items Addressed This Visit      Genitourinary   Stage 3a chronic kidney disease (HCC)    Check BMP Avoid nephrotoxic agents Advised to improve fluid intake Will refer to Nephrology based on BMP and possible GLOV5I renal        Other   Anxiety   Relevant Medications   mirtazapine (REMERON) 15 MG tablet   Malnutrition of mild degree (HCC) - Primary    Mostly related to anxiety Weight gain of 4 lbs compared to previous visit Advised to avoid skipping meals Proper hydration advised      Relevant Medications   mirtazapine (REMERON) 15 MG tablet   Metabolic alkalosis    Previous BMP suggests metabolic alkalosis with CKD stage 3a Could be worsening due to dehydration Has not had BMP done yet, check BMP today Will obatin Renal US if persistent elevated S. Cr.       Other Visit Diagnoses    Acute cystitis without hematuria       Relevant Medications   nitrofurantoin, macrocrystal-monohydrate, (MACROBID) 100 MG capsule  Meds ordered this encounter  Medications  . mirtazapine (REMERON) 15 MG tablet    Sig: Take 1 tablet (15 mg total) by mouth at bedtime.    Dispense:  30 tablet    Refill:  2  . DISCONTD: sulfamethoxazole-trimethoprim (BACTRIM DS) 800-160 MG tablet    Sig: Take 1 tablet by mouth 2 (two) times daily.    Dispense:  10 tablet    Refill:  0  . nitrofurantoin, macrocrystal-monohydrate, (MACROBID) 100 MG capsule    Sig:  Take 1 capsule (100 mg total) by mouth 2 (two) times daily.    Dispense:  10 capsule    Refill:  0    Please cancel the Bactrim DS prescription.    Follow-up: Return in about 4 months (around 02/07/2021) for HTN, CKD, and weight check.    Anabel Halonutwik K Zilah Villaflor, MD

## 2020-10-08 NOTE — Assessment & Plan Note (Signed)
Check BMP Avoid nephrotoxic agents Advised to improve fluid intake Will refer to Nephrology based on BMP and possible US renal

## 2020-10-08 NOTE — Assessment & Plan Note (Addendum)
Previous BMP suggests metabolic alkalosis with CKD stage 3a Could be worsening due to dehydration Has not had BMP done yet, check BMP today Will obatin Renal US if persistent elevated S. Cr.

## 2020-10-08 NOTE — Patient Instructions (Signed)
Please continue taking medications as prescribed.  Please take at least 2 liters of fluid in a day.  Please contact us before you run out of any medications or if you have trouble obtaining it from Pharmacy.

## 2020-10-08 NOTE — Assessment & Plan Note (Signed)
Mostly related to anxiety Weight gain of 4 lbs compared to previous visit Advised to avoid skipping meals Proper hydration advised

## 2020-10-09 LAB — URINALYSIS, ROUTINE W REFLEX MICROSCOPIC
Bilirubin, UA: NEGATIVE
Glucose, UA: NEGATIVE
Ketones, UA: NEGATIVE
Leukocytes,UA: NEGATIVE
Nitrite, UA: NEGATIVE
RBC, UA: NEGATIVE
Specific Gravity, UA: 1.021 (ref 1.005–1.030)
Urobilinogen, Ur: 0.2 mg/dL (ref 0.2–1.0)
pH, UA: 5.5 (ref 5.0–7.5)

## 2020-10-09 LAB — BASIC METABOLIC PANEL
BUN/Creatinine Ratio: 9 (ref 9–23)
BUN: 13 mg/dL (ref 6–20)
CO2: 20 mmol/L (ref 20–29)
Calcium: 9.1 mg/dL (ref 8.7–10.2)
Chloride: 107 mmol/L — ABNORMAL HIGH (ref 96–106)
Creatinine, Ser: 1.4 mg/dL — ABNORMAL HIGH (ref 0.57–1.00)
Glucose: 89 mg/dL (ref 65–99)
Potassium: 4.2 mmol/L (ref 3.5–5.2)
Sodium: 143 mmol/L (ref 134–144)
eGFR: 54 mL/min/{1.73_m2} — ABNORMAL LOW (ref >59)

## 2020-10-09 LAB — MICROSCOPIC EXAMINATION
Bacteria, UA: NONE SEEN
Casts: NONE SEEN /lpf
RBC, Urine: NONE SEEN /hpf (ref 0–2)

## 2020-11-05 ENCOUNTER — Other Ambulatory Visit: Payer: Self-pay

## 2020-11-05 ENCOUNTER — Ambulatory Visit (INDEPENDENT_AMBULATORY_CARE_PROVIDER_SITE_OTHER): Payer: Medicaid Other

## 2020-11-05 DIAGNOSIS — Z3042 Encounter for surveillance of injectable contraceptive: Secondary | ICD-10-CM | POA: Diagnosis not present

## 2020-11-05 MED ORDER — MEDROXYPROGESTERONE ACETATE 150 MG/ML IM SUSP
150.0000 mg | Freq: Once | INTRAMUSCULAR | Status: AC
Start: 2020-11-05 — End: 2020-11-05
  Administered 2020-11-05: 150 mg via INTRAMUSCULAR

## 2020-11-05 NOTE — Progress Notes (Signed)
Pt here for depo which was given IM right glut.  NDC# 66993-371-79 

## 2020-11-06 ENCOUNTER — Ambulatory Visit: Payer: Medicaid Other

## 2020-12-24 ENCOUNTER — Other Ambulatory Visit: Payer: Self-pay | Admitting: Internal Medicine

## 2020-12-24 DIAGNOSIS — I10 Essential (primary) hypertension: Secondary | ICD-10-CM

## 2021-01-01 ENCOUNTER — Other Ambulatory Visit: Payer: Self-pay | Admitting: Internal Medicine

## 2021-01-01 DIAGNOSIS — N3000 Acute cystitis without hematuria: Secondary | ICD-10-CM

## 2021-01-28 ENCOUNTER — Ambulatory Visit: Payer: Medicaid Other

## 2021-01-31 ENCOUNTER — Ambulatory Visit: Payer: Medicaid Other

## 2021-02-01 ENCOUNTER — Ambulatory Visit (INDEPENDENT_AMBULATORY_CARE_PROVIDER_SITE_OTHER): Payer: Medicaid Other

## 2021-02-01 ENCOUNTER — Other Ambulatory Visit: Payer: Self-pay

## 2021-02-01 DIAGNOSIS — Z3042 Encounter for surveillance of injectable contraceptive: Secondary | ICD-10-CM | POA: Diagnosis not present

## 2021-02-01 MED ORDER — MEDROXYPROGESTERONE ACETATE 150 MG/ML IM SUSP
150.0000 mg | Freq: Once | INTRAMUSCULAR | Status: AC
  Administered 2021-02-01: 150 mg via INTRAMUSCULAR

## 2021-02-01 NOTE — Progress Notes (Signed)
Patient presents today for Depo Provera injection within dates. Given IM RUOQ. Patient tolerated well. 

## 2021-02-07 ENCOUNTER — Ambulatory Visit: Payer: Medicaid Other | Admitting: Nurse Practitioner

## 2021-02-07 ENCOUNTER — Ambulatory Visit: Payer: Medicaid Other | Admitting: Family Medicine

## 2021-03-26 ENCOUNTER — Ambulatory Visit
Admission: EM | Admit: 2021-03-26 | Discharge: 2021-03-26 | Discharge status: Absent without leave | Payer: Managed Care, Other (non HMO)

## 2021-03-26 ENCOUNTER — Other Ambulatory Visit: Payer: Self-pay

## 2021-04-26 ENCOUNTER — Other Ambulatory Visit: Payer: Self-pay | Admitting: Obstetrics and Gynecology

## 2021-04-26 ENCOUNTER — Other Ambulatory Visit: Payer: Self-pay

## 2021-04-26 ENCOUNTER — Ambulatory Visit (INDEPENDENT_AMBULATORY_CARE_PROVIDER_SITE_OTHER): Payer: Medicaid Other

## 2021-04-26 DIAGNOSIS — Z3042 Encounter for surveillance of injectable contraceptive: Secondary | ICD-10-CM | POA: Diagnosis not present

## 2021-04-26 MED ORDER — MEDROXYPROGESTERONE ACETATE 150 MG/ML IM SUSY: 1.0000 mL | PREFILLED_SYRINGE | INTRAMUSCULAR | 0 refills | Status: DC

## 2021-04-26 MED ORDER — MEDROXYPROGESTERONE ACETATE 150 MG/ML IM SUSP
150.0000 mg | Freq: Once | INTRAMUSCULAR | Status: AC
  Administered 2021-04-26: 150 mg via INTRAMUSCULAR

## 2021-04-26 NOTE — Telephone Encounter (Signed)
Pt calling for refill of depo; has appt at 3 today.  [phone]  Pt aware refill eRx'd.

## 2021-04-26 NOTE — Progress Notes (Signed)
Pt here for depo which was given IM right glut.  Pt tolerated inj well.  NDC# 66993-371-79 

## 2021-04-27 ENCOUNTER — Encounter (HOSPITAL_COMMUNITY): Payer: Self-pay | Admitting: *Deleted

## 2021-04-27 ENCOUNTER — Emergency Department (HOSPITAL_COMMUNITY)
Admission: EM | Admit: 2021-04-27 | Discharge: 2021-04-27 | Disposition: A | Discharge status: Home / Self Care | Payer: Managed Care, Other (non HMO) | Attending: Emergency Medicine | Admitting: Emergency Medicine

## 2021-04-27 DIAGNOSIS — I129 Hypertensive chronic kidney disease with stage 1 through stage 4 chronic kidney disease, or unspecified chronic kidney disease: Secondary | ICD-10-CM | POA: Diagnosis not present

## 2021-04-27 DIAGNOSIS — Z87891 Personal history of nicotine dependence: Secondary | ICD-10-CM | POA: Diagnosis not present

## 2021-04-27 DIAGNOSIS — N1831 Chronic kidney disease, stage 3a: Secondary | ICD-10-CM | POA: Insufficient documentation

## 2021-04-27 DIAGNOSIS — G43009 Migraine without aura, not intractable, without status migrainosus: Secondary | ICD-10-CM | POA: Insufficient documentation

## 2021-04-27 DIAGNOSIS — Z79899 Other long term (current) drug therapy: Secondary | ICD-10-CM | POA: Insufficient documentation

## 2021-04-27 LAB — CBC WITH DIFFERENTIAL/PLATELET
Abs Immature Granulocytes: 0.01 10*3/uL (ref 0.00–0.07)
Basophils Absolute: 0 10*3/uL (ref 0.0–0.1)
Basophils Relative: 0 %
Eosinophils Absolute: 0.2 10*3/uL (ref 0.0–0.5)
Eosinophils Relative: 3 %
HCT: 45 % (ref 36.0–46.0)
Hemoglobin: 14.6 g/dL (ref 12.0–15.0)
Immature Granulocytes: 0 %
Lymphocytes Relative: 28 %
Lymphs Abs: 2 10*3/uL (ref 0.7–4.0)
MCH: 29.1 pg (ref 26.0–34.0)
MCHC: 32.4 g/dL (ref 30.0–36.0)
MCV: 89.6 fL (ref 80.0–100.0)
Monocytes Absolute: 0.4 10*3/uL (ref 0.1–1.0)
Monocytes Relative: 6 %
Neutro Abs: 4.5 10*3/uL (ref 1.7–7.7)
Neutrophils Relative %: 63 %
Platelets: 203 10*3/uL (ref 150–400)
RBC: 5.02 MIL/uL (ref 3.87–5.11)
RDW: 12.8 % (ref 11.5–15.5)
WBC: 7.2 10*3/uL (ref 4.0–10.5)
nRBC: 0 % (ref 0.0–0.2)

## 2021-04-27 LAB — BASIC METABOLIC PANEL
Anion gap: 9 (ref 5–15)
BUN: 14 mg/dL (ref 6–20)
CO2: 24 mmol/L (ref 22–32)
Calcium: 9.2 mg/dL (ref 8.9–10.3)
Chloride: 104 mmol/L (ref 98–111)
Creatinine, Ser: 1.37 mg/dL — ABNORMAL HIGH (ref 0.44–1.00)
GFR, Estimated: 55 mL/min — ABNORMAL LOW (ref >60)
Glucose, Bld: 90 mg/dL (ref 70–99)
Potassium: 3.5 mmol/L (ref 3.5–5.1)
Sodium: 137 mmol/L (ref 135–145)

## 2021-04-27 LAB — POC URINE PREG, ED: Preg Test, Ur: NEGATIVE

## 2021-04-27 MED ORDER — KETOROLAC TROMETHAMINE 15 MG/ML IJ SOLN
15.0000 mg | Freq: Once | INTRAMUSCULAR | Status: AC
Start: 2021-04-27 — End: 2021-04-27
  Administered 2021-04-27: 15 mg via INTRAVENOUS
  Filled 2021-04-27: qty 1

## 2021-04-27 MED ORDER — ONDANSETRON HCL 4 MG/2ML IJ SOLN
4.0000 mg | Freq: Once | INTRAMUSCULAR | Status: AC
  Administered 2021-04-27: 4 mg via INTRAVENOUS
  Filled 2021-04-27: qty 2

## 2021-04-27 MED ORDER — SODIUM CHLORIDE 0.9 % IV BOLUS
1000.0000 mL | Freq: Once | INTRAVENOUS | Status: AC
  Administered 2021-04-27: 1000 mL via INTRAVENOUS

## 2021-04-27 MED ORDER — DIPHENHYDRAMINE HCL 50 MG/ML IJ SOLN
12.5000 mg | Freq: Once | INTRAMUSCULAR | Status: AC
  Administered 2021-04-27: 12.5 mg via INTRAVENOUS
  Filled 2021-04-27: qty 1

## 2021-04-27 NOTE — ED Provider Notes (Signed)
[facility] EMERGENCY DEPARTMENT Provider Note   CSN: [id number] Arrival date & time: 04/27/21  1230     History Chief Complaint  Patient presents with   Headache    Joyce [name] is a 25 y.o. female.  With history of hypertension, renal dysfunction, tension headache who presents to the emergency department with headache.  States that yesterday evening she began having constant, 5/10, right temporal headache that she describes as throbbing in nature.  She states that she was unable to sleep overnight and this morning continue to have headache however it had moved to the left temporal region.  States that she tried Excedrin this morning without relief of symptoms so presented to the emergency department.  She denies having fever, nausea or vomiting, confusion, photophobia or phonophobia.  Denies recent travel and up-to-date on vaccines.   Headache Associated symptoms: no dizziness, no fever and no photophobia       Past Medical History:  Diagnosis Date   Allergy    seasonal   Anxiety    Elevated blood-pressure reading without diagnosis of hypertension 12/26/2014   Elevated serum creatinine 12/26/2014   Gestational hypertension 02/12/2015   H/O acute renal failure 12/26/2014   Heart murmur    was told that she had beginning stages   Migraine    Post-viral cough syndrome 06/20/2019   Renal dysfunction    Renal dysfunction 12/26/2014   S/P thyroid surgery 12/26/2014   Severe preeclampsia    Short interval between pregnancies complicating pregnancy in third trimester, antepartum    Substance abuse (HCC)    uses marijuanna   Tension headache 11/11/2018   Thyroid disease    right side removed   Underweight 12/26/2014    Patient Active Problem List   Diagnosis Date Noted   Metabolic alkalosis 08/01/2020   Annual visit for general adult medical examination with abnormal findings 04/04/2020   Unintended weight loss 04/04/2020   Malnutrition of mild degree (HCC) 04/04/2020    Stage 3a chronic kidney disease (HCC) 11/09/2019   Essential hypertension 02/16/2019   Anxiety 02/16/2019    Past Surgical History:  Procedure Laterality Date   THYROID SURGERY       OB History     Gravida  2   Para  2   Term  1   Preterm  1   AB      Living  2      SAB      IAB      Ectopic      Multiple  0   Live Births  2           Family History  Problem Relation Age of Onset   Hypertension Mother    Birth defects Paternal Grandmother    Hypertension Maternal Grandmother     Social History   Tobacco Use   Smoking status: Former    Types: Cigarettes   Smokeless tobacco: Never  Vaping Use   Vaping Use: Never used  Substance Use Topics   Alcohol use: Yes    Comment: occasion   Drug use: Yes    Frequency: 1.0 times per week    Types: Marijuana    Home Medications Prior to Admission medications   Medication Sig Start Date End Date Taking? Authorizing Provider  hydrochlorothiazide (HYDRODIURIL) 25 MG tablet TAKE 1 TABLET(25 MG) BY MOUTH DAILY 12/24/20   Anabel Halon, MD  lisinopril (ZESTRIL) 10 MG tablet TAKE 1 TABLET(10 MG) BY MOUTH DAILY 12/24/20  Anabel Halon, MD  medroxyPROGESTERone Acetate 150 MG/ML SUSY Inject 1 mL (150 mg total) into the muscle every 3 (three) months. 04/26/21   Tresea Mall, CNM  mirtazapine (REMERON) 15 MG tablet Take 1 tablet (15 mg total) by mouth at bedtime. 10/08/20   Anabel Halon, MD  nitrofurantoin, macrocrystal-monohydrate, (MACROBID) 100 MG capsule Take 1 capsule (100 mg total) by mouth 2 (two) times daily. 10/08/20   Anabel Halon, MD    Allergies    Patient has no known allergies.  Review of Systems   Review of Systems  Constitutional:  Negative for fever.  Eyes:  Negative for photophobia.  Neurological:  Positive for headaches. Negative for dizziness, syncope, facial asymmetry and light-headedness.  All other systems reviewed and are negative.  Physical Exam Updated Vital Signs BP (!)  160/135 (BP Location: Right Arm)   Pulse 82   Temp 98.3 F (36.8 C) (Oral)   Resp 15   Ht 5\' 9"  (1.753 m)   Wt 63.5 kg   SpO2 100%   BMI 20.67 kg/m   Physical Exam Vitals and nursing note reviewed.  Constitutional:      General: She is not in acute distress.    Appearance: Normal appearance. She is well-developed. She is not toxic-appearing.  HENT:     Head: Normocephalic and atraumatic.     Mouth/Throat:     Mouth: Mucous membranes are moist.  Eyes:     General: No visual field deficit or scleral icterus.    Extraocular Movements: Extraocular movements intact.     Right eye: Normal extraocular motion and no nystagmus.     Left eye: Normal extraocular motion and no nystagmus.     Pupils: Pupils are equal, round, and reactive to light.  Neck:     Meningeal: Brudzinski's sign and Kernig's sign absent.  Cardiovascular:     Rate and Rhythm: Normal rate and regular rhythm.     Pulses: Normal pulses.     Heart sounds: Normal heart sounds. No murmur heard. Pulmonary:     Effort: Pulmonary effort is normal. No respiratory distress.     Breath sounds: Normal breath sounds.  Abdominal:     General: Bowel sounds are normal. There is no distension.     Palpations: Abdomen is soft.  Musculoskeletal:        General: Normal range of motion.     Cervical back: Normal range of motion and neck supple. No rigidity.  Skin:    General: Skin is warm and dry.     Capillary Refill: Capillary refill takes less than 2 seconds.  Neurological:     General: No focal deficit present.     Mental Status: She is alert and oriented to person, place, and time. Mental status is at baseline.     GCS: GCS eye subscore is 4. GCS verbal subscore is 5. GCS motor subscore is 6.     Cranial Nerves: No cranial nerve deficit, dysarthria or facial asymmetry.     Sensory: No sensory deficit.  Psychiatric:        Mood and Affect: Mood normal.        Speech: Speech normal.        Behavior: Behavior normal.     ED Results / Procedures / Treatments   Labs (all labs ordered are listed, but only abnormal results are displayed) Labs Reviewed  BASIC METABOLIC PANEL - Abnormal; Notable for the following components:      Result Value  Creatinine, Ser 1.37 (*)    GFR, Estimated 55 (*)    All other components within normal limits  CBC WITH DIFFERENTIAL/PLATELET  POC URINE PREG, ED   EKG None  Radiology No results found.  Procedures Procedures   Medications Ordered in ED Medications  diphenhydrAMINE (BENADRYL) injection 12.5 mg (12.5 mg Intravenous Given 04/27/21 1516)  ondansetron (ZOFRAN) injection 4 mg (4 mg Intravenous Given 04/27/21 1517)  ketorolac (TORADOL) 15 MG/ML injection 15 mg (15 mg Intravenous Given 04/27/21 1515)  sodium chloride 0.9 % bolus 1,000 mL (0 mLs Intravenous Stopped 04/27/21 1625)    ED Course  I have reviewed the triage vital signs and the nursing notes.  Pertinent labs & imaging results that were available during my care of the patient were reviewed by me and considered in my medical decision making (see chart for details).    MDM Rules/Calculators/A&P 25 year old female who presents emergency department with headache.  Her presentation is most consistent with benign headache from either tension versus migraine.  There are no headache red flags on exam.  Her neurological exam is without evidence of meningismus, altered mental status, focal neurological findings so doubt meningitis, encephalitis, stroke.  Her presentation is not consistent with an acute intracranial bleed including SAH.  She has no history of trauma so doubt intracranial hemorrhage.  Given her history and physical exam temporal arteritis is unlikely as is acute angle-closure glaucoma.  Patient has no sign of increased intracranial pressure or weight loss suggestive of mass-effect.  No fevers concerning for meningitis or intracranial abscess. No indication for imaging based on her history and  physical exam. Her pain was controlled here in the emergency department Benadryl, Toradol, Zofran.  Given 1 L IV fluid. Not pregnant Creatinine 1.37 which is similar to previous.  Her blood pressure was elevated when she arrived but decreased after pain control.  She has diagnoses of hypertension and takes her blood pressure medication as prescribed.  Instructed her to follow-up with PCP regarding blood pressure. Vital signs stable throughout her stay and she is safe for discharge with primary care follow-up. Final Clinical Impression(s) / ED Diagnoses Final diagnoses:  Migraine without aura and without status migrainosus, not intractable    Rx / DC Orders ED Discharge Orders     None        Cristopher Peru, PA-C 04/27/21 1640    Sloan Leiter, DO 04/28/21 1659

## 2021-04-27 NOTE — Discharge Instructions (Signed)
You were seen in the emergency department today for headache.  While you are here we gave you some medications and fluids to help improve your headache which it appears it did.  Your blood pressure was elevated today and came down a bit with pain control.  Please continue take your high blood pressure medication as this can exacerbate having headache.  Please return to emergency department if you have sudden worsening of your headache associated with nausea or vomiting, fevers.

## 2021-04-27 NOTE — ED Provider Notes (Signed)
Emergency Medicine Provider Triage Evaluation Note  Joyce [name] , a 25 y.o. female  was evaluated in triage.  Pt complains of migraine headache since yesterday.  Symptoms similar to prior migraines but not relieved by her home tx of excedrin, has taken 4 tablets without relief.  Able to tolerate fluids, photophobia left eye, nausea without emesis.  Review of Systems  Positive: Headache, nausea, photophobia Negative: Fever, focal neuro deficitis  Physical Exam  BP (!) 160/135 (BP Location: Right Arm)   Pulse 82   Temp 98.3 F (36.8 C) (Oral)   Resp 15   Ht 5\' 9"  (1.753 m)   Wt 63.5 kg   SpO2 100%   BMI 20.67 kg/m  Gen:   Awake, no distress   Resp:  Normal effort  MSK:   Moves extremities without difficulty  Other:  No obvious neuro deficits on exam.   Medical Decision Making  Medically screening exam initiated at 2:05 PM.  Appropriate orders placed.  Joyce [name] was informed that the remainder of the evaluation will be completed by another provider, this initial triage assessment does not replace that evaluation, and the importance of remaining in the ED until their evaluation is complete.  Pt medically screened, will benefit from IV fluids, migraine cocktail.  No indication for imaging at this time.    Lin Givens, PA-C 04/27/21 1407    04/29/21, DO 04/27/21 1624

## 2021-04-27 NOTE — ED Triage Notes (Signed)
Headache for over 24 hours unrelieved by excedrin

## 2021-04-29 ENCOUNTER — Telehealth: Payer: Self-pay

## 2021-04-29 NOTE — Telephone Encounter (Signed)
Transition Care Management Unsuccessful Follow-up Telephone Call  Date of discharge and from where:  04/27/2021 from [facility]  Attempts:  1st Attempt  Reason for unsuccessful TCM follow-up call:  Left voice message

## 2021-04-30 NOTE — Telephone Encounter (Signed)
Transition Care Management Follow-up Telephone Call Date of discharge and from where: 04/27/2021-[facility]  How have you been since you were released from the hospital? Patient stated she is doing fine.  Any questions or concerns? No  Items Reviewed: Did the pt receive and understand the discharge instructions provided? Yes  Medications obtained and verified?  No medications sent to the pharmacy Other? No  Any new allergies since your discharge? No  Dietary orders reviewed? No Do you have support at home? Yes   Home Care and Equipment/Supplies: Were home health services ordered? not applicable If so, what is the name of the agency? N/A  Has the agency set up a time to come to the patient's home? not applicable Were any new equipment or medical supplies ordered?  No What is the name of the medical supply agency? N/A Were you able to get the supplies/equipment? not applicable Do you have any questions related to the use of the equipment or supplies? No  Functional Questionnaire: (I = Independent and D = Dependent) ADLs: I  Bathing/Dressing- I  Meal Prep- I  Eating- I  Maintaining continence- I  Transferring/Ambulation- I  Managing Meds- I  Follow up appointments reviewed:  PCP Hospital f/u appt confirmed? No   Specialist Hospital f/u appt confirmed? No   Are transportation arrangements needed? No  If their condition worsens, is the pt aware to call PCP or go to the Emergency Dept.? Yes Was the patient provided with contact information for the PCP's office or ED? Yes Was to pt encouraged to call back with questions or concerns? Yes

## 2021-06-04 ENCOUNTER — Ambulatory Visit: Payer: Managed Care, Other (non HMO) | Admitting: Nurse Practitioner

## 2021-06-11 ENCOUNTER — Ambulatory Visit (INDEPENDENT_AMBULATORY_CARE_PROVIDER_SITE_OTHER): Payer: Managed Care, Other (non HMO) | Admitting: Obstetrics and Gynecology

## 2021-06-11 ENCOUNTER — Encounter: Payer: Self-pay | Admitting: Obstetrics and Gynecology

## 2021-06-11 ENCOUNTER — Other Ambulatory Visit (HOSPITAL_COMMUNITY)
Admission: RE | Admit: 2021-06-11 | Discharge: 2021-06-11 | Disposition: A | Discharge status: Home / Self Care | Payer: Managed Care, Other (non HMO) | Source: Ambulatory Visit | Attending: Obstetrics and Gynecology | Admitting: Obstetrics and Gynecology

## 2021-06-11 ENCOUNTER — Other Ambulatory Visit: Payer: Self-pay

## 2021-06-11 ENCOUNTER — Ambulatory Visit: Payer: Managed Care, Other (non HMO) | Admitting: Nurse Practitioner

## 2021-06-11 VITALS — BP 106/80 | Ht 69.0 in | Wt 131.0 lb

## 2021-06-11 DIAGNOSIS — Z3042 Encounter for surveillance of injectable contraceptive: Secondary | ICD-10-CM | POA: Diagnosis not present

## 2021-06-11 DIAGNOSIS — Z124 Encounter for screening for malignant neoplasm of cervix: Secondary | ICD-10-CM | POA: Insufficient documentation

## 2021-06-11 DIAGNOSIS — B009 Herpesviral infection, unspecified: Secondary | ICD-10-CM | POA: Insufficient documentation

## 2021-06-11 DIAGNOSIS — Z01419 Encounter for gynecological examination (general) (routine) without abnormal findings: Secondary | ICD-10-CM

## 2021-06-11 DIAGNOSIS — Z113 Encounter for screening for infections with a predominantly sexual mode of transmission: Secondary | ICD-10-CM

## 2021-06-11 DIAGNOSIS — N921 Excessive and frequent menstruation with irregular cycle: Secondary | ICD-10-CM

## 2021-06-11 DIAGNOSIS — R630 Anorexia: Secondary | ICD-10-CM

## 2021-06-11 MED ORDER — ESTRADIOL 1 MG PO TABS: 1.0000 mg | ORAL_TABLET | Freq: Every day | ORAL | 0 refills | Status: DC

## 2021-06-11 MED ORDER — VALACYCLOVIR HCL 500 MG PO TABS: 500.0000 mg | ORAL_TABLET | Freq: Two times a day (BID) | ORAL | 1 refills | Status: AC

## 2021-06-11 MED ORDER — MEDROXYPROGESTERONE ACETATE 150 MG/ML IM SUSY: 1.0000 mL | PREFILLED_SYRINGE | INTRAMUSCULAR | 3 refills | Status: DC

## 2021-06-11 NOTE — Progress Notes (Signed)
PCP:  Noreene Larsson, NP   Chief Complaint  Patient presents with   Gynecologic Exam    Fatigue and loss of appetite x 1 month     HPI:      [name] is a 25 y.o. [id number] who LMP was No LMP recorded. Patient has had an injection., presents today for her annual examination.  Her menses are absent with depo, occas BTB not close to next depo injection, sometimes lasting 2 wks, no dysmen. Mood changes improved, wants to cont.  Had nexplanon removed 9/19, not sure if she would want IUD. No other options interested her. Hx of HTN, CKD. Followed by PCP  Sex activity: single partner, contraception - Depo-Provera injections. Has vaginal dryness and sometimes pain where she had HSV outbreak; although no recent outbreaks. Hasn't tried lubricants.  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 No recent outbreaks.   There is no FH of breast cancer. There is no FH of ovarian cancer. The patient does do self-breast exams.  Tobacco use: The patient denies current or previous tobacco use. Alcohol use: rare No drug use.  Exercise: moderately active  She does get adequate calcium but not Vitamin D in her diet.  Has had fatigue and decreased appetite the past month with 5# wt loss. No N/V/diarrhea/constipation. Sleeps about 5 hrs nightly, getting up to eat because she's hungry. But not eating/drinking during the day.  Hx of CKD, abn CMP 10/22.   Past Medical History:  Diagnosis Date   Allergy    seasonal   Anxiety    Elevated blood-pressure reading without diagnosis of hypertension 12/26/2014   Elevated serum creatinine 12/26/2014   Gestational hypertension 02/12/2015   H/O acute renal failure 12/26/2014   Heart murmur    was told that she had beginning stages   Migraine    Post-viral cough syndrome 06/20/2019   Renal dysfunction    Renal dysfunction 12/26/2014   S/P thyroid surgery 12/26/2014   Severe preeclampsia    Short  interval between pregnancies complicating pregnancy in third trimester, antepartum    Substance abuse ([place])    uses marijuanna   Tension headache 11/11/2018   Thyroid disease    right side removed   Underweight 12/26/2014    Past Surgical History:  Procedure Laterality Date   THYROID SURGERY      Family History  Problem Relation Age of Onset   Hypertension Mother    Birth defects Paternal Grandmother    Hypertension Maternal Grandmother     Social History   Socioeconomic History   Marital status: Single    Spouse name: Not on file   Number of children: 2   Years of education: Not on file   Highest education level: High school graduate  Occupational History   Not on file  Tobacco Use   Smoking status: Former    Types: Cigarettes   Smokeless tobacco: Never  Vaping Use   Vaping Use: Never used  Substance and Sexual Activity   Alcohol use: Yes    Comment: occasion   Drug use: Yes    Frequency: 1.0 times per week    Types: Marijuana   Sexual activity: Yes    Birth control/protection: Injection  Other Topics Concern   Not on file  Social History Narrative   Lives with two kids   Auberi: 5 (full time)    Jaxon: 4 (half and half) [place]  Fathers are in the lives, help take care of children      Enjoys: playing with kids, going out to eat, movies and music      Diet: Does not feel like she eats as good as she should. Does eat veggies and fruits; will drink v8   Caffeine: pepis -4-5 cans a day, has slowed down    Water: gallon daily      Wear seat belt   Does not wear sunscreen   Smoke and carbon monoxide detectors   Does not use phone while driving, unless it is a call.    Social Determinants of Health   Financial Resource Strain: Low Risk    Difficulty of Paying Living Expenses: Not very hard  Food Insecurity: No Food Insecurity   Worried About Charity fundraiser in the Last Year: Never true   Ran Out of Food in the Last Year: Never true  Transportation  Needs: No Transportation Needs   Lack of Transportation (Medical): No   Lack of Transportation (Non-Medical): No  Physical Activity: Sufficiently Active   Days of Exercise per Week: 5 days   Minutes of Exercise per Session: 30 min  Stress: No Stress Concern Present   Feeling of Stress : Not at all  Social Connections: Socially Isolated   Frequency of Communication with Friends and Family: More than three times a week   Frequency of Social Gatherings with Friends and Family: Twice a week   Attends Religious Services: Never   Marine scientist or Organizations: No   Attends Music therapist: Never   Marital Status: Never married  Human resources officer Violence: Not At Risk   Fear of Current or Ex-Partner: No   Emotionally Abused: No   Physically Abused: No   Sexually Abused: No    Outpatient Medications Prior to Visit  Medication Sig Dispense Refill   hydrochlorothiazide (HYDRODIURIL) 25 MG tablet TAKE 1 TABLET(25 MG) BY MOUTH DAILY 90 tablet 0   lisinopril (ZESTRIL) 10 MG tablet TAKE 1 TABLET(10 MG) BY MOUTH DAILY 90 tablet 0   medroxyPROGESTERone Acetate 150 MG/ML SUSY Inject 1 mL (150 mg total) into the muscle every 3 (three) months. 1 mL 0   mirtazapine (REMERON) 15 MG tablet Take 1 tablet (15 mg total) by mouth at bedtime. 30 tablet 2   nitrofurantoin, macrocrystal-monohydrate, (MACROBID) 100 MG capsule Take 1 capsule (100 mg total) by mouth 2 (two) times daily. 10 capsule 0   Facility-Administered Medications Prior to Visit  Medication Dose Route Frequency Provider Last Rate Last Admin   medroxyPROGESTERone (DEPO-PROVERA) injection 150 mg  150 mg Intramuscular Once Rod Can, CNM          ROS:  Review of Systems  Constitutional:  Positive for appetite change, fatigue and unexpected weight change. Negative for fever.  Respiratory:  Negative for cough, shortness of breath and wheezing.   Cardiovascular:  Negative for chest pain, palpitations and leg  swelling.  Gastrointestinal:  Negative for blood in stool, constipation, diarrhea, nausea and vomiting.  Endocrine: Negative for cold intolerance, heat intolerance and polyuria.  Genitourinary:  Positive for dyspareunia and vaginal bleeding. Negative for dysuria, flank pain, frequency, genital sores, hematuria, menstrual problem, pelvic pain, urgency, vaginal discharge and vaginal pain.  Musculoskeletal:  Negative for back pain, joint swelling and myalgias.  Skin:  Negative for rash.  Neurological:  Negative for dizziness, syncope, light-headedness, numbness and headaches.  Hematological:  Negative for adenopathy.  Psychiatric/Behavioral:  Negative for agitation, confusion,  sleep disturbance and suicidal ideas. The patient is not nervous/anxious.  BREAST: No symptoms   Objective: BP 106/80    Ht 5\' 9"  (1.753 m)    Wt 131 lb (59.4 kg)    BMI 19.35 kg/m    Physical Exam Constitutional:      Appearance: She is well-developed.  Genitourinary:     Vulva normal.     Right Labia: No rash, tenderness or lesions.    Left Labia: No tenderness, lesions or rash.    Vaginal bleeding present.     No vaginal discharge, erythema or tenderness.      Right Adnexa: not tender and no mass present.    Left Adnexa: not tender and no mass present.    No cervical friability or polyp.     Uterus is not enlarged or tender.  Breasts:    Right: No mass, nipple discharge, skin change or tenderness.     Left: No mass, nipple discharge, skin change or tenderness.  Neck:     Thyroid: No thyromegaly.  Cardiovascular:     Rate and Rhythm: Normal rate and regular rhythm.     Heart sounds: Normal heart sounds. No murmur heard. Pulmonary:     Effort: Pulmonary effort is normal.     Breath sounds: Normal breath sounds.  Abdominal:     Palpations: Abdomen is soft.     Tenderness: There is no abdominal tenderness. There is no guarding or rebound.  Musculoskeletal:        General: Normal range of motion.      Cervical back: Normal range of motion.  Lymphadenopathy:     Cervical: No cervical adenopathy.  Neurological:     General: No focal deficit present.     Mental Status: She is alert and oriented to person, place, and time.     Cranial Nerves: No cranial nerve deficit.  Skin:    General: Skin is warm and dry.  Psychiatric:        Mood and Affect: Mood normal.        Behavior: Behavior normal.        Thought Content: Thought content normal.        Judgment: Judgment normal.  Vitals reviewed.    Assessment/Plan: Encounter for annual routine gynecological examination  Cervical cancer screening - Plan: Cytology - PAP  Screening for STD (sexually transmitted disease) - Plan: Cytology - PAP  Encounter for Depo-Provera contraception - Plan: medroxyPROGESTERone Acetate 150 MG/ML SUSY; Rx RF. Cont ca/add Vit D.   Breakthrough bleeding on depo provera - Plan: estradiol (ESTRACE) 1 MG tablet; add ERT for 2 wks. F/u prn.   Dyspareunia--due to vaginal dryness. Add lubricants. Discussed poss side effect of depo. Should not be related to HSV. If sx don't improve with lubricant, try valtrex QD to see if sx improve. F/u prn.   HSV-2 (herpes simplex virus 2) infection - Plan: valACYclovir (VALTREX) 500 MG tablet; Rx RF.   Decreased appetite--f/u with PCP given CKD (our lab is already closed today).    Meds ordered this encounter  Medications   medroxyPROGESTERone Acetate 150 MG/ML SUSY    Sig: Inject 1 mL (150 mg total) into the muscle every 3 (three) months.    Dispense:  1 mL    Refill:  3    Order Specific Question:   Supervising Provider    Answer:   Nadara Mustard   estradiol (ESTRACE) 1 MG tablet    Sig: Take 1 tablet (1  mg total) by mouth daily for 14 days.    Dispense:  14 tablet    Refill:  0    Order Specific Question:   Supervising Provider    Answer:   Gae Dry [id number]   valACYclovir (VALTREX) 500 MG tablet    Sig: Take 1 tablet (500 mg total) by mouth 2  (two) times daily for 3 days. Prn sx    Dispense:  30 tablet    Refill:  1    Order Specific Question:   Supervising Provider    Answer:   Gae Dry [id number]              GYN counsel adequate intake of calcium and vitamin D, diet and exercise     F/U  Return in about 1 year (around 06/11/2022).  Masa Lubin B. Miguelina Fore, PA-C 06/11/2021 4:50 PM

## 2021-06-11 NOTE — Patient Instructions (Signed)
I value your feedback and you entrusting us with your care. If you get a [facility] patient survey, I would appreciate you taking the time to let us know about your experience today. Thank you! ? ? ?

## 2021-06-19 LAB — CYTOLOGY - PAP
Chlamydia: NEGATIVE
Comment: NEGATIVE
Comment: NORMAL
Diagnosis: NEGATIVE
Neisseria Gonorrhea: NEGATIVE

## 2021-09-25 ENCOUNTER — Telehealth: Payer: Self-pay

## 2021-09-25 NOTE — Telephone Encounter (Signed)
Pt calling for an appt for depo inj; records from [facility] have not been sent to [place].  [phone] ?

## 2021-09-26 NOTE — Telephone Encounter (Signed)
Contacted patient via phone. The person whom answered the phone stated this was the wrong number. Will try to reach patient via Mychart

## 2021-10-03 ENCOUNTER — Ambulatory Visit (INDEPENDENT_AMBULATORY_CARE_PROVIDER_SITE_OTHER): Payer: Medicaid Other

## 2021-10-03 DIAGNOSIS — Z3042 Encounter for surveillance of injectable contraceptive: Secondary | ICD-10-CM

## 2021-10-03 LAB — POCT URINE PREGNANCY: Preg Test, Ur: NEGATIVE

## 2021-10-03 NOTE — Telephone Encounter (Signed)
Pt was seen today.

## 2021-10-03 NOTE — Progress Notes (Signed)
Pt here for depo; pt states she has not had a period, nipples are sore; preg test obtained - negative; last inj was Oct.  Per MMF, wait for inj when pt is on her period; condom use in the meantime; call for appt when period starts and can get depo as far out from period as one week.  Pt aware. ?

## 2021-10-17 ENCOUNTER — Ambulatory Visit (INDEPENDENT_AMBULATORY_CARE_PROVIDER_SITE_OTHER): Payer: Medicaid Other

## 2021-10-17 DIAGNOSIS — Z30019 Encounter for initial prescription of contraceptives, unspecified: Secondary | ICD-10-CM | POA: Diagnosis not present

## 2021-10-17 MED ORDER — MEDROXYPROGESTERONE ACETATE 150 MG/ML IM SUSP
150.0000 mg | Freq: Once | INTRAMUSCULAR | Status: AC
  Administered 2021-10-17: 150 mg via INTRAMUSCULAR

## 2021-10-31 ENCOUNTER — Encounter: Payer: Self-pay | Admitting: Family Medicine

## 2021-10-31 ENCOUNTER — Ambulatory Visit: Payer: Medicaid Other | Admitting: Family Medicine

## 2021-10-31 VITALS — BP 138/90 | HR 62 | Ht 70.0 in | Wt 128.2 lb

## 2021-10-31 DIAGNOSIS — Z1159 Encounter for screening for other viral diseases: Secondary | ICD-10-CM

## 2021-10-31 DIAGNOSIS — T148XXA Other injury of unspecified body region, initial encounter: Secondary | ICD-10-CM

## 2021-10-31 DIAGNOSIS — E559 Vitamin D deficiency, unspecified: Secondary | ICD-10-CM | POA: Diagnosis not present

## 2021-10-31 DIAGNOSIS — F419 Anxiety disorder, unspecified: Secondary | ICD-10-CM

## 2021-10-31 DIAGNOSIS — I10 Essential (primary) hypertension: Secondary | ICD-10-CM | POA: Diagnosis not present

## 2021-10-31 DIAGNOSIS — R7301 Impaired fasting glucose: Secondary | ICD-10-CM

## 2021-10-31 DIAGNOSIS — T7491XA Unspecified adult maltreatment, confirmed, initial encounter: Secondary | ICD-10-CM

## 2021-10-31 MED ORDER — HYDROCHLOROTHIAZIDE 25 MG PO TABS: ORAL_TABLET | ORAL | 0 refills | Status: DC

## 2021-10-31 MED ORDER — MIRTAZAPINE 7.5 MG PO TABS: 7.5000 mg | ORAL_TABLET | Freq: Every day | ORAL | 2 refills | Status: DC

## 2021-10-31 MED ORDER — LISINOPRIL 10 MG PO TABS: ORAL_TABLET | ORAL | 0 refills | Status: DC

## 2021-10-31 NOTE — Assessment & Plan Note (Addendum)
?  Uncontrolled ?Refilled medications ?The patient had not taken her medications for two weeks. ?

## 2021-10-31 NOTE — Patient Instructions (Addendum)
I appreciate the opportunity to provide care to you today! ?  ?Follow up: 3 months ? ?Labs: please stop by the lab to get your blood drawn tomorrow ? ?Screening: Hep c ? ? Refills: your anxiety and blood pressure medication ? ?  ?It was a pleasure to see you and I look forward to continuing to work together on your health and well-being. ?Please do not hesitate to call the office if you need care or have questions about your care. ?  ?Have a wonderful day and week. ?With Gratitude, ?Gilmore Laroche MSN, FNP-BC ? ?
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
## 2021-10-31 NOTE — Assessment & Plan Note (Signed)
-  She reports increased anxiety, difficulty sleeping, and trying to gain weight. ?-Remeron ordered  ?

## 2021-10-31 NOTE — Assessment & Plan Note (Addendum)
-  Bruising is likely due to the Asprin in the Excedrin ?- Will look at her platelet count and Pt/INR ?-If all labs are normal, I will place her on another medication for migraines ?-She is contraindicated for triptans due to her uncontrolled BP ?

## 2021-11-02 LAB — CBC WITH DIFFERENTIAL/PLATELET
Basophils Absolute: 0 10*3/uL (ref 0.0–0.2)
Basos: 0 %
EOS (ABSOLUTE): 0.1 10*3/uL (ref 0.0–0.4)
Eos: 1 %
Hematocrit: 39.9 % (ref 34.0–46.6)
Hemoglobin: 12.7 g/dL (ref 11.1–15.9)
Immature Grans (Abs): 0 10*3/uL (ref 0.0–0.1)
Immature Granulocytes: 0 %
Lymphocytes Absolute: 1 10*3/uL (ref 0.7–3.1)
Lymphs: 15 %
MCH: 27.8 pg (ref 26.6–33.0)
MCHC: 31.8 g/dL (ref 31.5–35.7)
MCV: 87 fL (ref 79–97)
Monocytes Absolute: 0.3 10*3/uL (ref 0.1–0.9)
Monocytes: 5 %
Neutrophils Absolute: 5.4 10*3/uL (ref 1.4–7.0)
Neutrophils: 79 %
Platelets: 167 10*3/uL (ref 150–450)
RBC: 4.57 x10E6/uL (ref 3.77–5.28)
RDW: 12 % (ref 11.7–15.4)
WBC: 6.8 10*3/uL (ref 3.4–10.8)

## 2021-11-02 LAB — CMP14+EGFR
ALT: 12 IU/L (ref 0–32)
AST: 17 IU/L (ref 0–40)
Albumin/Globulin Ratio: 2.2 (ref 1.2–2.2)
Albumin: 4.4 g/dL (ref 3.9–5.0)
Alkaline Phosphatase: 122 IU/L — ABNORMAL HIGH (ref 44–121)
BUN/Creatinine Ratio: 8 — ABNORMAL LOW (ref 9–23)
BUN: 12 mg/dL (ref 6–20)
Bilirubin Total: 0.5 mg/dL (ref 0.0–1.2)
CO2: 20 mmol/L (ref 20–29)
Calcium: 9.4 mg/dL (ref 8.7–10.2)
Chloride: 106 mmol/L (ref 96–106)
Creatinine, Ser: 1.45 mg/dL — ABNORMAL HIGH (ref 0.57–1.00)
Globulin, Total: 2 g/dL (ref 1.5–4.5)
Glucose: 93 mg/dL (ref 70–99)
Potassium: 3.8 mmol/L (ref 3.5–5.2)
Sodium: 140 mmol/L (ref 134–144)
Total Protein: 6.4 g/dL (ref 6.0–8.5)
eGFR: 51 mL/min/{1.73_m2} — ABNORMAL LOW (ref >59)

## 2021-11-02 LAB — VITAMIN D 25 HYDROXY (VIT D DEFICIENCY, FRACTURES): Vit D, 25-Hydroxy: 27.6 ng/mL — ABNORMAL LOW (ref 30.0–100.0)

## 2021-11-02 LAB — TSH+FREE T4
Free T4: 1.43 ng/dL (ref 0.82–1.77)
TSH: 0.456 u[IU]/mL (ref 0.450–4.500)

## 2021-11-02 LAB — LIPID PANEL
Chol/HDL Ratio: 2.1 ratio (ref 0.0–4.4)
Cholesterol, Total: 111 mg/dL (ref 100–199)
HDL: 53 mg/dL (ref >39)
LDL Chol Calc (NIH): 47 mg/dL (ref 0–99)
Triglycerides: 40 mg/dL (ref 0–149)
VLDL Cholesterol Cal: 11 mg/dL (ref 5–40)

## 2021-11-02 LAB — HEMOGLOBIN A1C
Est. average glucose Bld gHb Est-mCnc: 105 mg/dL
Hgb A1c MFr Bld: 5.3 % (ref 4.8–5.6)

## 2021-11-02 LAB — HEPATITIS C ANTIBODY: Hep C Virus Ab: NONREACTIVE

## 2021-11-04 NOTE — Progress Notes (Signed)
She can apply topical vitamin K; It has been found to speed the healing of bruises.  ? ?Common side effects of oral Vit. K is decreased appetite  ?

## 2021-11-06 ENCOUNTER — Telehealth: Payer: Self-pay

## 2021-11-06 NOTE — Telephone Encounter (Signed)
Patient returning call about lab results.  

## 2021-11-06 NOTE — Telephone Encounter (Signed)
Returned call, went over labs.  ?

## 2021-11-25 ENCOUNTER — Emergency Department
Admission: EM | Admit: 2021-11-25 | Discharge: 2021-11-25 | Disposition: A | Discharge status: Home / Self Care | Payer: Medicaid Other | Attending: Emergency Medicine | Admitting: Emergency Medicine

## 2021-11-25 ENCOUNTER — Encounter: Payer: Self-pay | Admitting: Emergency Medicine

## 2021-11-25 ENCOUNTER — Other Ambulatory Visit: Payer: Self-pay

## 2021-11-25 ENCOUNTER — Emergency Department: Payer: Medicaid Other

## 2021-11-25 DIAGNOSIS — R110 Nausea: Secondary | ICD-10-CM | POA: Insufficient documentation

## 2021-11-25 DIAGNOSIS — S199XXA Unspecified injury of neck, initial encounter: Secondary | ICD-10-CM | POA: Diagnosis present

## 2021-11-25 DIAGNOSIS — R519 Headache, unspecified: Secondary | ICD-10-CM | POA: Diagnosis not present

## 2021-11-25 DIAGNOSIS — S39012A Strain of muscle, fascia and tendon of lower back, initial encounter: Secondary | ICD-10-CM | POA: Diagnosis not present

## 2021-11-25 DIAGNOSIS — Y92410 Unspecified street and highway as the place of occurrence of the external cause: Secondary | ICD-10-CM | POA: Diagnosis not present

## 2021-11-25 DIAGNOSIS — N183 Chronic kidney disease, stage 3 unspecified: Secondary | ICD-10-CM | POA: Insufficient documentation

## 2021-11-25 DIAGNOSIS — S161XXA Strain of muscle, fascia and tendon at neck level, initial encounter: Secondary | ICD-10-CM | POA: Diagnosis not present

## 2021-11-25 DIAGNOSIS — R000 Tachycardia, unspecified: Secondary | ICD-10-CM | POA: Insufficient documentation

## 2021-11-25 DIAGNOSIS — I129 Hypertensive chronic kidney disease with stage 1 through stage 4 chronic kidney disease, or unspecified chronic kidney disease: Secondary | ICD-10-CM | POA: Insufficient documentation

## 2021-11-25 LAB — POC URINE PREG, ED: Preg Test, Ur: NEGATIVE

## 2021-11-25 MED ORDER — ONDANSETRON 4 MG PO TBDP
4.0000 mg | ORAL_TABLET | Freq: Once | ORAL | Status: AC
  Administered 2021-11-25: 4 mg via ORAL
  Filled 2021-11-25: qty 1

## 2021-11-25 MED ORDER — NAPROXEN 500 MG PO TABS: 500.0000 mg | ORAL_TABLET | Freq: Two times a day (BID) | ORAL | 11 refills | Status: DC

## 2021-11-25 MED ORDER — IBUPROFEN 600 MG PO TABS
600.0000 mg | ORAL_TABLET | Freq: Once | ORAL | Status: AC
  Administered 2021-11-25: 600 mg via ORAL

## 2021-11-25 NOTE — Discharge Instructions (Addendum)
-  Take Tylenol and naproxen as needed for pain  -Follow-up with your primary care provider as needed.  -Return to the emergency department anytime if you begin to experience any new or worsening symptoms.
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
## 2021-11-26 ENCOUNTER — Telehealth: Payer: Self-pay

## 2021-11-26 NOTE — Telephone Encounter (Signed)
Transition Care Management Follow-up Telephone Call Date of discharge and from where: 11/25/2021-[facility] How have you been since you were released from the hospital? Pt stated she is doing fine but still a little sore. She has started her medication.  Any questions or concerns? No  Items Reviewed: Did the pt receive and understand the discharge instructions provided? Yes  Medications obtained and verified? Yes  Other? No  Any new allergies since your discharge? No  Dietary orders reviewed? No Do you have support at home? Yes   Home Care and Equipment/Supplies: Were home health services ordered? not applicable If so, what is the name of the agency? N/A  Has the agency set up a time to come to the patient's home? not applicable Were any new equipment or medical supplies ordered?  No What is the name of the medical supply agency? N/A Were you able to get the supplies/equipment? not applicable Do you have any questions related to the use of the equipment or supplies? No  Functional Questionnaire: (I = Independent and D = Dependent) ADLs: I  Bathing/Dressing- I  Meal Prep- I  Eating- I  Maintaining continence- I  Transferring/Ambulation- I  Managing Meds- I  Follow up appointments reviewed:  PCP Hospital f/u appt confirmed? No   Specialist Hospital f/u appt confirmed? No   Are transportation arrangements needed? No  If their condition worsens, is the pt aware to call PCP or go to the Emergency Dept.? Yes Was the patient provided with contact information for the PCP's office or ED? Yes Was to pt encouraged to call back with questions or concerns? Yes

## 2022-01-09 ENCOUNTER — Ambulatory Visit: Payer: Medicaid Other

## 2022-01-16 ENCOUNTER — Ambulatory Visit (INDEPENDENT_AMBULATORY_CARE_PROVIDER_SITE_OTHER): Payer: Medicaid Other

## 2022-01-16 DIAGNOSIS — Z3042 Encounter for surveillance of injectable contraceptive: Secondary | ICD-10-CM | POA: Diagnosis not present

## 2022-01-16 MED ORDER — MEDROXYPROGESTERONE ACETATE 150 MG/ML IM SUSP
150.0000 mg | Freq: Once | INTRAMUSCULAR | Status: AC
  Administered 2022-01-16: 150 mg via INTRAMUSCULAR

## 2022-01-16 NOTE — Progress Notes (Signed)
Date last pap: 06/11/2021. Last Depo-Provera: 10/17/2021. Side Effects if any: none. Serum HCG indicated? no. Depo-Provera 150 mg IM given by: Loran Senters, CMA. Next appointment due 12 weeks Vital signs not done.

## 2022-02-04 ENCOUNTER — Ambulatory Visit: Payer: Medicaid Other | Admitting: Family Medicine

## 2022-02-14 ENCOUNTER — Emergency Department
Admission: EM | Admit: 2022-02-14 | Discharge: 2022-02-14 | Disposition: A | Discharge status: Home / Self Care | Payer: Medicaid Other | Attending: Emergency Medicine | Admitting: Emergency Medicine

## 2022-02-14 ENCOUNTER — Other Ambulatory Visit: Payer: Self-pay

## 2022-02-14 DIAGNOSIS — Z79899 Other long term (current) drug therapy: Secondary | ICD-10-CM | POA: Insufficient documentation

## 2022-02-14 DIAGNOSIS — I10 Essential (primary) hypertension: Secondary | ICD-10-CM | POA: Diagnosis not present

## 2022-02-14 DIAGNOSIS — R55 Syncope and collapse: Secondary | ICD-10-CM | POA: Diagnosis not present

## 2022-02-14 LAB — CBC
HCT: 41.3 % (ref 36.0–46.0)
Hemoglobin: 12.7 g/dL (ref 12.0–15.0)
MCH: 27.7 pg (ref 26.0–34.0)
MCHC: 30.8 g/dL (ref 30.0–36.0)
MCV: 90.2 fL (ref 80.0–100.0)
Platelets: 216 10*3/uL (ref 150–400)
RBC: 4.58 MIL/uL (ref 3.87–5.11)
RDW: 13.9 % (ref 11.5–15.5)
WBC: 8.6 10*3/uL (ref 4.0–10.5)
nRBC: 0 % (ref 0.0–0.2)

## 2022-02-14 LAB — URINALYSIS, ROUTINE W REFLEX MICROSCOPIC
Bilirubin Urine: NEGATIVE
Glucose, UA: NEGATIVE mg/dL
Hgb urine dipstick: NEGATIVE
Ketones, ur: NEGATIVE mg/dL
Leukocytes,Ua: NEGATIVE
Nitrite: NEGATIVE
Protein, ur: NEGATIVE mg/dL
Specific Gravity, Urine: 1.015 (ref 1.005–1.030)
pH: 6 (ref 5.0–8.0)

## 2022-02-14 LAB — BASIC METABOLIC PANEL
Anion gap: 7 (ref 5–15)
BUN: 23 mg/dL — ABNORMAL HIGH (ref 6–20)
CO2: 27 mmol/L (ref 22–32)
Calcium: 9.4 mg/dL (ref 8.9–10.3)
Chloride: 107 mmol/L (ref 98–111)
Creatinine, Ser: 1.54 mg/dL — ABNORMAL HIGH (ref 0.44–1.00)
GFR, Estimated: 47 mL/min — ABNORMAL LOW (ref >60)
Glucose, Bld: 102 mg/dL — ABNORMAL HIGH (ref 70–99)
Potassium: 3.5 mmol/L (ref 3.5–5.1)
Sodium: 141 mmol/L (ref 135–145)

## 2022-02-14 LAB — PREGNANCY, URINE: Preg Test, Ur: NEGATIVE
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
## 2022-02-14 NOTE — Discharge Instructions (Signed)
Discontinue the lisinopril for the next several days as this may be dropping her blood pressure too low.  You can continue the hydrochlorothiazide.  Make sure you are drinking plenty of fluids.  Follow-up with your doctor on Monday as scheduled to then reassess your blood pressure, your symptoms, and decide what to do about your medication.  Return to the ER for new, worsening, or persistent severe lightheadedness, episodes of passing out, seizure-like activity, severe headache, chest pain, palpitations, or any other new or worsening symptoms that concern you.

## 2022-02-14 NOTE — ED Provider Notes (Signed)
[facility] Provider Note    Event Date/Time   First MD Initiated Contact with Patient 02/14/22 1650     (approximate)   History   Loss of Consciousness   HPI  Joyce [name] is a 26 y.o. female with history of hypertension and kidney disease who presents with several episodes of syncope this week.  The patient states that 3 times she felt lightheaded, nauseous, and weak, then her vision greyed out, and then she passed out.  During the of these episodes a friend noted some convulsive type movements in her legs.  Each time she came to and was sweaty but fully alert.  She states that this happened a couple of times when she was pregnant 9 years ago but has not happened since.  She states that she has been eating and drinking regularly.  She is on lisinopril and hydrochlorothiazide for her hypertension and kidney disease.    Physical Exam   Triage Vital Signs: ED Triage Vitals [02/14/22 1315]  Enc Vitals Group     BP 127/83     Pulse Rate 100     Resp 18     Temp 98 F (36.7 C)     Temp Source Oral     SpO2 100 %     Weight      Height      Head Circumference      Peak Flow      Pain Score 0     Pain Loc      Pain Edu?      Excl. in [place]?     Most recent vital signs: Vitals:   02/14/22 1315  BP: 127/83  Pulse: 100  Resp: 18  Temp: 98 F (36.7 C)  SpO2: 100%     General: And oriented, well-appearing. CV:  Good peripheral perfusion.  Resp:  Normal effort.  Abd:  No distention.  Other:  EOMI.  PERRLA.  No facial droop.  Motor intact in all extremities.  No ataxia on finger-to-nose.  No pronator drift.  Normal gait.  Moist mucous membranes.   ED Results / Procedures / Treatments   Labs (all labs ordered are listed, but only abnormal results are displayed) Labs Reviewed  BASIC METABOLIC PANEL - Abnormal; Notable for the following components:      Result Value   Glucose, Bld 102 (*)    BUN 23 (*)    Creatinine, Ser 1.54 (*)    GFR,  Estimated 47 (*)    All other components within normal limits  URINALYSIS, ROUTINE W REFLEX MICROSCOPIC - Abnormal; Notable for the following components:   Color, Urine YELLOW (*)    APPearance HAZY (*)    All other components within normal limits  CBC  PREGNANCY, URINE  CBG MONITORING, ED  POC URINE PREG, ED     EKG  ED ECG REPORT I, Dionne Bucy, the attending physician, personally viewed and interpreted this ECG.  Date: 02/14/2022 EKG Time: 1719 Rate: 79 Rhythm: normal sinus rhythm QRS Axis: normal Intervals: normal ST/T Wave abnormalities: normal Narrative Interpretation: no evidence of acute ischemia    RADIOLOGY    PROCEDURES:  Critical Care performed: No  Procedures   MEDICATIONS ORDERED IN ED: Medications - No data to display   IMPRESSION / MDM / ASSESSMENT AND PLAN / ED COURSE  I reviewed the triage vital signs and the nursing notes.  26 year old female with PMH as noted above presents with several episodes of syncope  this week all with a prodrome of lightheadedness.  On exam the patient is well-appearing.  Her vital signs are normal.  Neurologic exam is nonfocal.  The physical exam is otherwise unremarkable.  Initial EKG was difficult to interpret due to poor quality baseline but appeared normal; a repeat is also normal.  Metabolic panel is unremarkable except for elevated creatinine which is consistent with the patient's baseline.  She has no leukocytosis or other abnormalities on CBC.  Urinalysis is pending.  Differential diagnosis includes, but is not limited to, dehydration, hyperlipidemia, orthostatic hypotension, POTS, less likely cardiac arrhythmia given the prodromal symptoms.  I do not suspect CNS cause.  Patient's presentation is most consistent with acute presentation with potential threat to life or bodily function.  Given the negative work-up and normal exam, I anticipate the patient will be appropriate for discharge home with  outpatient work-up.  ----------------------------------------- 6:00 PM on 02/14/2022 -----------------------------------------  Urinalysis is negative.  The patient is stable for discharge home.  I did consider whether she may warrant admission given recurrent syncope, however the negative work-up is reassuring, the patient is overall low risk, and there is no evidence for emergent etiology though would benefit from cardiac monitoring or further inpatient management.  The patient has follow-up scheduled with her doctor in 3 days.  I advised her to pause taking the lisinopril and just continue her's hydrochlorothiazide as the combination of blood pressure medications may be dropping her pressure too low.  She can then reassess when she sees her doctor.  I gave the patient thorough return precautions and she expressed understanding.   FINAL CLINICAL IMPRESSION(S) / ED DIAGNOSES   Final diagnoses:  Syncope, unspecified syncope type     Rx / DC Orders   ED Discharge Orders     None        Note:  This document was prepared using Dragon voice recognition software and may include unintentional dictation errors.    Dionne Bucy, MD 02/14/22 1801

## 2022-02-17 ENCOUNTER — Encounter: Payer: Self-pay | Admitting: Family Medicine

## 2022-02-17 ENCOUNTER — Ambulatory Visit: Payer: Medicaid Other | Admitting: Family Medicine

## 2022-04-03 ENCOUNTER — Ambulatory Visit: Payer: Medicaid Other

## 2022-04-03 ENCOUNTER — Ambulatory Visit (INDEPENDENT_AMBULATORY_CARE_PROVIDER_SITE_OTHER): Payer: Medicaid Other

## 2022-04-03 VITALS — BP 162/127 | HR 76 | Wt 130.0 lb

## 2022-04-03 DIAGNOSIS — Z3042 Encounter for surveillance of injectable contraceptive: Secondary | ICD-10-CM

## 2022-04-03 MED ORDER — MEDROXYPROGESTERONE ACETATE 150 MG/ML IM SUSP
150.0000 mg | Freq: Once | INTRAMUSCULAR | Status: AC
  Administered 2022-04-03: 150 mg via INTRAMUSCULAR

## 2022-04-03 NOTE — Progress Notes (Cosign Needed Addendum)
Last Depo-Provera: 01/16/2022. Side Effects if any: none. Serum HCG indicated? no. Depo-Provera 150 mg IM given by: Otelia Limes, CMA  Next appointment due 12 weeks

## 2022-04-16 ENCOUNTER — Other Ambulatory Visit: Payer: Self-pay | Admitting: Family Medicine

## 2022-04-16 DIAGNOSIS — I10 Essential (primary) hypertension: Secondary | ICD-10-CM

## 2022-05-11 ENCOUNTER — Emergency Department: Payer: Medicaid Other

## 2022-05-11 ENCOUNTER — Emergency Department
Admission: EM | Admit: 2022-05-11 | Discharge: 2022-05-11 | Disposition: A | Discharge status: Home / Self Care | Payer: Medicaid Other | Attending: Emergency Medicine | Admitting: Emergency Medicine

## 2022-05-11 ENCOUNTER — Other Ambulatory Visit: Payer: Self-pay

## 2022-05-11 DIAGNOSIS — R0789 Other chest pain: Secondary | ICD-10-CM | POA: Diagnosis not present

## 2022-05-11 DIAGNOSIS — I10 Essential (primary) hypertension: Secondary | ICD-10-CM | POA: Diagnosis not present

## 2022-05-11 DIAGNOSIS — F410 Panic disorder [episodic paroxysmal anxiety] without agoraphobia: Secondary | ICD-10-CM | POA: Diagnosis not present

## 2022-05-11 DIAGNOSIS — F419 Anxiety disorder, unspecified: Secondary | ICD-10-CM

## 2022-05-11 DIAGNOSIS — R079 Chest pain, unspecified: Secondary | ICD-10-CM | POA: Diagnosis not present

## 2022-05-11 LAB — CBC
HCT: 40.5 % (ref 36.0–46.0)
Hemoglobin: 13.2 g/dL (ref 12.0–15.0)
MCH: 27.7 pg (ref 26.0–34.0)
MCHC: 32.6 g/dL (ref 30.0–36.0)
MCV: 84.9 fL (ref 80.0–100.0)
Platelets: 207 10*3/uL (ref 150–400)
RBC: 4.77 MIL/uL (ref 3.87–5.11)
RDW: 12.5 % (ref 11.5–15.5)
WBC: 9.8 10*3/uL (ref 4.0–10.5)
nRBC: 0 % (ref 0.0–0.2)

## 2022-05-11 LAB — BASIC METABOLIC PANEL
Anion gap: 6 (ref 5–15)
BUN: 18 mg/dL (ref 6–20)
CO2: 27 mmol/L (ref 22–32)
Calcium: 9.7 mg/dL (ref 8.9–10.3)
Chloride: 106 mmol/L (ref 98–111)
Creatinine, Ser: 1.46 mg/dL — ABNORMAL HIGH (ref 0.44–1.00)
GFR, Estimated: 50 mL/min — ABNORMAL LOW (ref >60)
Glucose, Bld: 110 mg/dL — ABNORMAL HIGH (ref 70–99)
Potassium: 3.7 mmol/L (ref 3.5–5.1)
Sodium: 139 mmol/L (ref 135–145)

## 2022-05-11 LAB — TROPONIN I (HIGH SENSITIVITY): Troponin I (High Sensitivity): 4 ng/L (ref <18)

## 2022-05-11 MED ORDER — MIRTAZAPINE 7.5 MG PO TABS: 7.5000 mg | ORAL_TABLET | Freq: Every day | ORAL | 0 refills | Status: DC

## 2022-05-11 NOTE — ED Triage Notes (Signed)
Pt presents to ED with feeling of heart racing, left side CP radiating down left arm. Pt does have panic attacks but says "this time it feels different." Pt was recently taken off one of her BP meds saying her BP got to low. Pt does take lorazepam at night and prn for panic attacks.

## 2022-05-11 NOTE — ED Provider Notes (Signed)
[facility] Emergency Department Provider Note     Event Date/Time   First MD Initiated Contact with Patient 05/11/22 1442     (approximate)   History   No chief complaint on file.   HPI  Joyce [name] is a 26 y.o. female with a history of panic attack, anxiety, hypertension, arteries, presents to the ED with complaints of heart racing as well as some left-sided chest pain.  Patient reports some radiation to the left arm.  Noted intermittent episodes over the last several months, noted increasing frequency of episodes.  She reports a history of panic disorder and takes lorazepam as prescribed.  She denies any syncope, shortness of breath, or recent head injury.  She notes resolved symptoms at this time.     Physical Exam   Triage Vital Signs: ED Triage Vitals  Enc Vitals Group     BP 05/11/22 1238 (!) 186/135     Pulse Rate 05/11/22 1238 99     Resp 05/11/22 1238 20     Temp 05/11/22 1254 98.4 F (36.9 C)     Temp Source 05/11/22 1238 Oral     SpO2 05/11/22 1238 98 %     Weight 05/11/22 1240 133 lb (60.3 kg)     Height 05/11/22 1240 5\' 9"  (1.753 m)     Head Circumference --      Peak Flow --      Pain Score 05/11/22 1239 7     Pain Loc --      Pain Edu? --      Excl. in GC? --     Most recent vital signs: Vitals:   05/11/22 1254 05/11/22 1454  BP:  (!) 137/98  Pulse:  98  Resp:  19  Temp: 98.4 F (36.9 C) 98.6 F (37 C)  SpO2:  100%    General Awake, no distress.  NAD CV:  Good peripheral perfusion.  RRR RESP:  Normal effort.  CTA ABD:  No distention.    ED Results / Procedures / Treatments   Labs (all labs ordered are listed, but only abnormal results are displayed) Labs Reviewed  BASIC METABOLIC PANEL - Abnormal; Notable for the following components:      Result Value   Glucose, Bld 110 (*)    Creatinine, Ser 1.46 (*)    GFR, Estimated 50 (*)    All other components within normal limits  CBC  POC URINE PREG, ED   TROPONIN I (HIGH SENSITIVITY)  TROPONIN I (HIGH SENSITIVITY)     EKG  Sinus tachycardia  ventricular rate 101 bpm PR interval 126 ms QRS duration 84 ms QT/QTc   338/438 ms Normal axis No ST changes  RADIOLOGY  I personally viewed and evaluated these images as part of my medical decision making, as well as reviewing the written report by the radiologist.  ED Provider Interpretation: no acute findings  DG Chest 2 View  Result Date: 05/11/2022 CLINICAL DATA:  Pt presents to ED with feeling of heart racing, left side CP radiating down left arm. Pt does have panic attacks but says "this time it feels different." Pt was recently taken off one of her BP meds saying her BP got to low. Pt takes lorazepam. EXAM: CHEST - 2 VIEW COMPARISON:  10/13/2019.  CT, 11/22/2010. FINDINGS: Normal heart, mediastinum and hila. Lungs are clear.  No pleural effusion or pneumothorax. Skeletal structures are within normal limits. IMPRESSION: Normal chest radiographs. Electronically Signed  By: Amie Portland M.D.   On: 05/11/2022 13:45     PROCEDURES:  Critical Care performed: No  Procedures   MEDICATIONS ORDERED IN ED: Medications - No data to display   IMPRESSION / MDM / ASSESSMENT AND PLAN / ED COURSE  I reviewed the triage vital signs and the nursing notes.                              Differential diagnosis includes, but is not limited to, ACS, aortic dissection, pulmonary embolism, cardiac tamponade, pneumothorax, pneumonia, pericarditis, myocarditis, GI-related causes including esophagitis/gastritis, and musculoskeletal chest wall pain.     Patient's presentation is most consistent with acute presentation with potential threat to life or bodily function.  Patient to the ED for evaluation of intermittent episodes of chest pain with referral into the left upper extremity for the last several months.  Patient describes her symptoms as different from her typical panic attack.  She does  however endorse, that the symptoms have been coming more frequently.  She denies any associated chest pain, syncope, cough, hemoptysis.  She presents to the ED for evaluation is found have reassuring exam overall.  No signs of any acute lab abnormalities.  No electrolyte imbalances.  No critical anemia, and a negative troponin.  Patient's diagnosis is consistent with nonspecific chest pain and panic disorder. Patient will be discharged home with prescriptions refill for her mirtazapine. Patient is to follow up with her primary provider as needed or otherwise directed. Patient is given ED precautions to return to the ED for any worsening or new symptoms.     FINAL CLINICAL IMPRESSION(S) / ED DIAGNOSES   Final diagnoses:  Panic attack     Rx / DC Orders   ED Discharge Orders          Ordered    mirtazapine (REMERON) 7.5 MG tablet  Daily at bedtime        05/11/22 1520             Note:  This document was prepared using Dragon voice recognition software and may include unintentional dictation errors.    Lissa Hoard, PA-C 05/11/22 1739    Shaune Pollack, MD 05/11/22 2241

## 2022-05-11 NOTE — Discharge Instructions (Addendum)
Follow-up with one of the local providers for ongoing care.

## 2022-05-11 NOTE — ED Notes (Signed)
Patient states she wants to be discharged. Dr. Erma Heritage aware.

## 2022-05-11 NOTE — ED Notes (Signed)
Jenise Bacon-Menshew PA-C at bedside.

## 2022-05-26 ENCOUNTER — Telehealth: Payer: Self-pay | Admitting: Family Medicine
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
## 2022-05-27 ENCOUNTER — Ambulatory Visit: Payer: Medicaid Other | Admitting: Internal Medicine

## 2022-06-02 ENCOUNTER — Encounter: Payer: Self-pay | Admitting: Internal Medicine

## 2022-06-02 ENCOUNTER — Ambulatory Visit: Payer: Medicaid Other | Admitting: Internal Medicine

## 2022-06-02 VITALS — BP 160/110 | HR 87 | Temp 98.8°F | Ht 69.0 in | Wt 142.0 lb

## 2022-06-02 DIAGNOSIS — F1120 Opioid dependence, uncomplicated: Secondary | ICD-10-CM | POA: Insufficient documentation

## 2022-06-02 MED ORDER — BUPRENORPHINE HCL-NALOXONE HCL 2-0.5 MG SL SUBL: 1.0000 | SUBLINGUAL_TABLET | Freq: Every day | SUBLINGUAL | 0 refills | Status: DC
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
## 2022-06-02 NOTE — Patient Instructions (Addendum)
Instruction for starting buprenorphine-naloxone (Suboxone) at home  You should not mix buprenorphine-naloxone with other drugs especially large amounts of alcohol or benzodiazepines (Valium, Klonopin, Xanax, Ativan). If you have taken any of these medication, please tell your healthcare team and do not take buprenorphine-naloxone.   You must wait until you are feeling signs of withdrawal from opiates (heroin, pain pills) before you take buprenorphine-naloxone.  If you do not wait long enough the medication will make you sicker.  If you do take it too soon and get sicker then wait until later when you feel signs of withdrawal listed below and then try again.   Signs that you are withdrawing: Anxiety, restlessness, can't sit still Aches Nausea or sick to your stomach Goose-bumps Racing heart   You should have ALL of these symptoms before you start taking your first dose of buprenorphine-naloxone. If you are not sure call your healthcare team.
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
## 2022-06-16 ENCOUNTER — Ambulatory Visit: Payer: Medicaid Other | Admitting: Internal Medicine

## 2022-06-16 ENCOUNTER — Encounter: Payer: Self-pay | Admitting: Internal Medicine

## 2022-06-16 VITALS — BP 133/92 | HR 74 | Resp 16 | Ht 69.0 in | Wt 141.0 lb

## 2022-06-16 DIAGNOSIS — I10 Essential (primary) hypertension: Secondary | ICD-10-CM | POA: Diagnosis not present

## 2022-06-16 DIAGNOSIS — F411 Generalized anxiety disorder: Secondary | ICD-10-CM

## 2022-06-16 DIAGNOSIS — F1120 Opioid dependence, uncomplicated: Secondary | ICD-10-CM | POA: Diagnosis not present

## 2022-06-16 DIAGNOSIS — F419 Anxiety disorder, unspecified: Secondary | ICD-10-CM

## 2022-06-16 MED ORDER — BUPRENORPHINE HCL-NALOXONE HCL 2-0.5 MG SL FILM: 1.0000 | ORAL_FILM | Freq: Every day | SUBLINGUAL | 0 refills | Status: DC

## 2022-06-16 MED ORDER — ESCITALOPRAM OXALATE 10 MG PO TABS: 10.0000 mg | ORAL_TABLET | Freq: Every day | ORAL | 0 refills | Status: DC

## 2022-06-16 NOTE — Assessment & Plan Note (Signed)
Chronic problem. Controlled.  - Continue Suboxine 2.5 mg tablet until end of month. Will switch to 2.5 mg film at this time. - UDS today - Follow up in 4 weeks

## 2022-06-16 NOTE — Progress Notes (Signed)
     CC: Bleeding/Bruising (Has been noticing a lot of bruises on her legs and has one on her back that is sore to the touch and she wants it checked ) and Medication Problem (States the suboxone is making her tired )    HPI:Ms.Joyce [name] is a 26 y.o. female who presents for evaluation of opioid use disorder, generalized anxiety disorder, and hypertension.   OUD: History of OUD started on suboxone 06/02/2022. Currently on Suboxone 2.5 mg tablet. She reports adherence to Suboxone therapy which is controlling cravings well. Denies any relapse. She has feeling of fatigue in the mornings and associates this with suboxone. This is a low dose. She would like to try the Suboxone film. PDMP is appropriate. Refill not due until end of month.   HTN: Taking lisinopril 10 mg and HCTZ 25 mg.   GAD: Stopped taking Remeron. Reports taking a friends Gabapentin. Discussed this could maker her sleepy, but she usually takes this at night. GAD 7 0, but patient endorses increased anxiety.      Past Medical History:  Diagnosis Date   Allergy    seasonal   Anxiety    Elevated blood-pressure reading without diagnosis of hypertension 12/26/2014   Elevated serum creatinine 12/26/2014   Gestational hypertension 02/12/2015   H/O acute renal failure 12/26/2014   Heart murmur    was told that she had beginning stages   Migraine    Post-viral cough syndrome 06/20/2019   Renal dysfunction    Renal dysfunction 12/26/2014   S/P thyroid surgery 12/26/2014   Severe preeclampsia    Short interval between pregnancies complicating pregnancy in third trimester, antepartum    Substance abuse (HCC)    uses marijuanna   Tension headache 11/11/2018   Thyroid disease    right side removed   Underweight 12/26/2014     Physical Exam: Vitals:   06/16/22 0844  BP: (!) 133/92  Pulse: 74  Resp: 16  SpO2: 98%  Weight: 141 lb (64 kg)  Height: 5\' 9"  (1.753 m)     Physical Exam Constitutional:      General: She is  not in acute distress.    Appearance: She is normal weight. She is not ill-appearing.  Eyes:     General: No scleral icterus.    Conjunctiva/sclera: Conjunctivae normal.  Cardiovascular:     Rate and Rhythm: Normal rate and regular rhythm.     Heart sounds: No murmur heard.    No friction rub.  Pulmonary:     Effort: Pulmonary effort is normal.     Breath sounds: No wheezing, rhonchi or rales.  Psychiatric:        Mood and Affect: Mood is anxious.        Behavior: Behavior normal.        Thought Content: Thought content normal.      Assessment & Plan:   Essential hypertension Chronic problem. Controlled. - Continue current regimen of HCTZ 25 mg and Lisinopril 10 mg. - Check BMP - Check  microalbumin/cr  Anxiety Chronic problem, uncontrolled -stop Remeron -start Lexapro 10 mg - Referral to [facility]BH - Follow up in 4 weeks   Opioid use disorder, moderate, dependence (HCC) Chronic problem. Controlled.  - Continue Suboxine 2.5 mg tablet until end of month. Will switch to 2.5 mg film at this time. - UDS today - Follow up in 4 weeks    , MD

## 2022-06-16 NOTE — Assessment & Plan Note (Signed)
Chronic problem, uncontrolled -stop Remeron -start Lexapro 10 mg - Referral to IBH - Follow up in 4 weeks

## 2022-06-16 NOTE — Patient Instructions (Addendum)
Thank you for trusting me with your care. To recap, today we discussed the following:  Labs: - Microalbumin / creatinine urine ratio - BMP8+EGFR - ToxASSURE Select 13 (MW), Urine - Buprenorphine HCl-Naloxone HCl (SUBOXONE) 2-0.5 MG FILM; Place 1 Film under the tongue daily at 6 (six) AM.  Dispense: 30 each; Refill: 0   Anxiety - stop mirtazapine  - Start escitalopram (LEXAPRO) 10 MG tablet; Take 1 tablet (10 mg total) by mouth daily.  Dispense: 30 tablet; Refill: 0

## 2022-06-16 NOTE — Assessment & Plan Note (Addendum)
Chronic problem. Controlled. - Continue current regimen of HCTZ 25 mg and Lisinopril 10 mg. - Check BMP - Check  microalbumin/cr

## 2022-06-18 ENCOUNTER — Telehealth: Payer: Self-pay | Admitting: Internal Medicine

## 2022-06-18 LAB — MICROALBUMIN / CREATININE URINE RATIO
Creatinine, Urine: 75.9 mg/dL
Microalb/Creat Ratio: 23 mg/g creat (ref 0–29)
Microalbumin, Urine: 17.2 ug/mL

## 2022-06-18 LAB — BMP8+EGFR
BUN/Creatinine Ratio: 13 (ref 9–23)
BUN: 17 mg/dL (ref 6–20)
CO2: 19 mmol/L — ABNORMAL LOW (ref 20–29)
Calcium: 9.6 mg/dL (ref 8.7–10.2)
Chloride: 103 mmol/L (ref 96–106)
Creatinine, Ser: 1.26 mg/dL — ABNORMAL HIGH (ref 0.57–1.00)
Glucose: 85 mg/dL (ref 70–99)
Potassium: 4.4 mmol/L (ref 3.5–5.2)
Sodium: 138 mmol/L (ref 134–144)
eGFR: 60 mL/min/{1.73_m2} (ref >59)

## 2022-06-18 NOTE — Telephone Encounter (Signed)
Patient called stated that she has seen her labs results on mychart and they are really concerning her.  She would like a call back.  Pt's # [phone]

## 2022-06-19 LAB — TOXASSURE SELECT 13 (MW), URINE

## 2022-06-24 NOTE — Telephone Encounter (Signed)
Dr Barbaraann Faster has reviewed and sent her results to her

## 2022-06-26 NOTE — Progress Notes (Deleted)
    NURSE VISIT NOTE  Subjective:    Patient ID: Joyce [name], female    DOB: 1996-06-20, 26 y.o.   MRN: [id number]  HPI  Patient is a 26 y.o. [id number] female who presents for surveillance of depo provera injection.  Last Depo-Provera: 04/03/2022 Side Effects if any: none. Serum HCG indicated? no. Depo-Provera 150 mg IM given by: Santiago Bumpers, CMA     Next appointment due: March 15 - Mach 29      The following portions of the patient's history were reviewed and updated as appropriate: allergies, current medications, past family history, past medical history, past social history, past surgical history, and problem list.  Review of Systems Pertinent items noted in HPI and remainder of comprehensive ROS otherwise negative.   Objective:   There were no vitals taken for this visit. There is no height or weight on file to calculate BMI.  General appearance: alert, cooperative, and no distress   Assessment:   1. Encounter for Depo-Provera contraception      Plan:   - Follow up in 3 months for Depo Provera Injection (March 15 - March 29)   Santiago Bumpers, [place] [facility]

## 2022-07-03 ENCOUNTER — Other Ambulatory Visit: Payer: Self-pay | Admitting: Obstetrics and Gynecology

## 2022-07-03 ENCOUNTER — Telehealth: Payer: Self-pay | Admitting: Licensed Clinical Social Worker

## 2022-07-03 ENCOUNTER — Ambulatory Visit: Payer: Medicaid Other | Admitting: Licensed Clinical Social Worker

## 2022-07-03 DIAGNOSIS — Z91199 Patient's noncompliance with other medical treatment and regimen due to unspecified reason: Secondary | ICD-10-CM

## 2022-07-03 DIAGNOSIS — Z3042 Encounter for surveillance of injectable contraceptive: Secondary | ICD-10-CM

## 2022-07-03 NOTE — Patient Instructions (Signed)

## 2022-07-03 NOTE — Telephone Encounter (Signed)
Called pt regarding scheduled mychart visit. Unable to leave message regarding call back. I will call patient again before no-showing visit at 9:15am

## 2022-07-03 NOTE — BH Specialist Note (Signed)
Called pt (2x) unable to leave message to sign on the scheduled mychart visit called phone number listed in epic. Pt no show

## 2022-07-03 NOTE — Progress Notes (Deleted)
NURSE VISIT  NOTE  Subjective:    Patient ID: Joyce [name], female    DOB: 1995-07-13, 27 y.o.   MRN: [id number]  HPI  Patient is a 27 y.o. [id number] female who presents for Surveillance of Depo Provera Injection.   Date last pap: ***. Last Depo-Provera: ***. Side Effects if any: ***. Serum HCG indicated? ***. Depo-Provera 150 mg IM given by: ***. Next appointment due ***.    The following portions of the patient's history were reviewed and updated as appropriate: allergies, current medications, past family history, past medical history, past social history, past surgical history, and problem list.   Review of Systems Pertinent items are noted in HPI.   Objective:   There were no vitals taken for this visit. There is no height or weight on file to calculate BMI.  General appearance: alert, cooperative, and no distress  Assessment:   1. Encounter for Depo-Provera contraception      Plan:   - Follow up for next depo injection on (March 23 - April 6)     Cristy Folks, CMA [facility]     Common Medications Safe in Pregnancy  Acne:      Constipation:  Benzoyl Peroxide     Colace  Clindamycin      Dulcolax Suppository  Topica Erythromycin     Fibercon  Salicylic Acid      Metamucil         Miralax AVOID:        Senakot   Accutane    Cough:  Retin-A       Cough Drops  Tetracycline      Phenergan w/ Codeine if Rx  Minocycline      Robitussin (Plain & DM)  Antibiotics:     Crabs/Lice:  Ceclor       RID  Cephalosporins    AVOID:  E-Mycins      Kwell  Keflex  Macrobid/Macrodantin   Diarrhea:  Penicillin      Kao-Pectate  Zithromax      Imodium AD         PUSH FLUIDS AVOID:       Cipro     Fever:  Tetracycline      Tylenol (Regular or Extra  Minocycline       Strength)  Levaquin      Extra Strength-Do not          Exceed 8 tabs/24 hrs Caffeine:        200mg /day (equiv. To 1 cup of coffee or  approx. 3 12 oz  sodas)         Gas: Cold/Hayfever:       Gas-X  Benadryl      Mylicon  Claritin       Phazyme  **Claritin-D        Chlor-Trimeton    Headaches:  Dimetapp      ASA-Free Excedrin  Drixoral-Non-Drowsy     Cold Compress  Mucinex (Guaifenasin)     Tylenol (Regular or Extra  Sudafed/Sudafed-12 Hour     Strength)  **Sudafed PE Pseudoephedrine   Tylenol Cold & Sinus     Vicks Vapor Rub  Zyrtec  **AVOID if Problems With Blood Pressure         Heartburn: Avoid lying down for at least 1 hour after meals  Aciphex      Maalox     Rash:  Milk of Magnesia     Benadryl    Mylanta  1% Hydrocortisone Cream  Pepcid  Pepcid Complete   Sleep Aids:  Prevacid      Ambien   Prilosec       Benadryl  Rolaids       Chamomile Tea  Tums (Limit 4/day)     Unisom         Tylenol PM         Warm milk-add vanilla or  Hemorrhoids:       Sugar for taste  Anusol/Anusol H.C.  (RX: Analapram 2.5%)  Sugar Substitutes:  Hydrocortisone OTC     Ok in moderation  Preparation H      Tucks        Vaseline lotion applied to tissue with wiping    Herpes:     Throat:  Acyclovir      Oragel  Famvir  Valtrex     Vaccines:         Flu Shot Leg Cramps:       *Gardasil  Benadryl      Hepatitis A         Hepatitis B Nasal Spray:       Pneumovax  Saline Nasal Spray     Polio Booster         Tetanus Nausea:       Tuberculosis test or PPD  Vitamin B6 25 mg TID   AVOID:    Dramamine      *Gardasil  Emetrol       Live Poliovirus  Ginger Root 250 mg QID    MMR (measles, mumps &  High Complex Carbs @ Bedtime    rebella)  Sea Bands-Accupressure    Varicella (Chickenpox)  Unisom 1/2 tab TID     *No known complications           If received before Pain:         Known pregnancy;   Darvocet       Resume series after  Lortab        Delivery  Percocet    Yeast:   Tramadol      Femstat  Tylenol 3      Gyne-lotrimin  Ultram       Monistat  Vicodin           MISC:         All Sunscreens           Hair  Coloring/highlights          Insect Repellant's          (Including DEET)         Mystic Tans

## 2022-07-04 ENCOUNTER — Ambulatory Visit: Payer: Medicaid Other

## 2022-07-04 DIAGNOSIS — Z3042 Encounter for surveillance of injectable contraceptive: Secondary | ICD-10-CM

## 2022-07-14 ENCOUNTER — Ambulatory Visit: Payer: Medicaid Other | Admitting: Internal Medicine

## 2022-07-14 ENCOUNTER — Encounter: Payer: Self-pay | Admitting: Family Medicine

## 2022-07-14 ENCOUNTER — Telehealth: Payer: Self-pay | Admitting: Family Medicine

## 2022-07-14 NOTE — Telephone Encounter (Signed)
Kindly send another letter and don't reschedule her appt. She can call and schedule an appt when she needs medical help

## 2022-07-14 NOTE — Telephone Encounter (Signed)
Kindly send a letter if a letter has not been sent

## 2022-07-14 NOTE — Telephone Encounter (Signed)
Pt has missed 3 appts as of today (02/14/22, 02/17/22 & 07/14/22). How would you like to proceed?

## 2022-07-14 NOTE — Telephone Encounter (Signed)
A letter was sent to her on 02/17/22. Would you like me to send another one as a reminder?

## 2022-07-15 ENCOUNTER — Ambulatory Visit (INDEPENDENT_AMBULATORY_CARE_PROVIDER_SITE_OTHER): Payer: Medicaid Other

## 2022-07-15 VITALS — BP 121/80 | HR 85 | Ht 69.0 in | Wt 134.0 lb

## 2022-07-15 DIAGNOSIS — Z3042 Encounter for surveillance of injectable contraceptive: Secondary | ICD-10-CM | POA: Diagnosis not present

## 2022-07-15 DIAGNOSIS — Z3202 Encounter for pregnancy test, result negative: Secondary | ICD-10-CM | POA: Diagnosis not present

## 2022-07-15 LAB — POCT URINE PREGNANCY: Preg Test, Ur: NEGATIVE — NL

## 2022-07-15 MED ORDER — MEDROXYPROGESTERONE ACETATE 150 MG/ML IM SUSP
150.0000 mg | Freq: Once | INTRAMUSCULAR | Status: AC
  Administered 2022-07-15: 150 mg via INTRAMUSCULAR

## 2022-07-15 NOTE — Progress Notes (Addendum)
    NURSE VISIT NOTE  Subjective:    Patient ID: KYLEIGHA MARKERT, female    DOB: 10/06/1995, 27 y.o.   MRN: [id number]  HPI  Patient is a 27 y.o. [id number] female who presents for depo provera injection.   Objective:    BP 121/80   Pulse 85   Ht 5\' 9"  (1.753 m)   Wt 134 lb (60.8 kg)   BMI 19.79 kg/m   Last Annual: n/a. Last pap: n/a. Last Depo-Provera: 04/03/2022. Side Effects if any: none. Serum HCG indicated? Yes . Depo-Provera 150 mg IM given by: Levert Feinstein, CMA. Site: Right Ventrogluteal    Assessment:   1. Encounter for Depo-Provera contraception      Plan:   Next appointment due between April 3 and 17.    Landis Gandy, CMA

## 2022-07-15 NOTE — Progress Notes (Deleted)
This encounter was created in error - please disregard.

## 2022-07-17 ENCOUNTER — Ambulatory Visit: Payer: Medicaid Other | Admitting: Obstetrics

## 2022-07-17 DIAGNOSIS — Z0000 Encounter for general adult medical examination without abnormal findings: Secondary | ICD-10-CM

## 2022-07-17 DIAGNOSIS — Z3042 Encounter for surveillance of injectable contraceptive: Secondary | ICD-10-CM

## 2022-07-18 ENCOUNTER — Other Ambulatory Visit: Payer: Self-pay | Admitting: Internal Medicine

## 2022-07-18 DIAGNOSIS — F411 Generalized anxiety disorder: Secondary | ICD-10-CM

## 2022-07-21 ENCOUNTER — Ambulatory Visit: Payer: Medicaid Other | Admitting: Obstetrics

## 2022-07-28 ENCOUNTER — Encounter: Payer: Self-pay | Admitting: Internal Medicine

## 2022-07-28 ENCOUNTER — Ambulatory Visit: Payer: Medicaid Other | Admitting: Internal Medicine

## 2022-07-28 VITALS — BP 136/88 | HR 78 | Ht 69.0 in | Wt 133.0 lb

## 2022-07-28 DIAGNOSIS — F411 Generalized anxiety disorder: Secondary | ICD-10-CM

## 2022-07-28 DIAGNOSIS — F1120 Opioid dependence, uncomplicated: Secondary | ICD-10-CM | POA: Diagnosis not present

## 2022-07-28 MED ORDER — BUPRENORPHINE HCL-NALOXONE HCL 2-0.5 MG SL SUBL: 1.0000 | SUBLINGUAL_TABLET | Freq: Every day | SUBLINGUAL | 0 refills | Status: DC

## 2022-07-28 MED ORDER — MIRTAZAPINE 15 MG PO TABS: 15.0000 mg | ORAL_TABLET | Freq: Every day | ORAL | 0 refills | Status: DC

## 2022-07-28 NOTE — Progress Notes (Signed)
   HPI: Joyce [name] presents for follow up of opioid use disorder and generalized anxiety disorder. I have reviewed the prior induction visit, follow up visits, and telephone encounters relevant to opiate use disorder (OUD) treatment. See assessment and  plan for details of today's visit.  Physical Exam: Vitals:   07/28/22 1311 07/28/22 1313  BP: (!) 137/91 136/88  Pulse: 78   SpO2: 98%   Weight: 133 lb 0.6 oz (60.3 kg)   Height: 5\' 9"  (1.753 m)      Physical Exam Constitutional:      General: She is not in acute distress.    Appearance: She is not ill-appearing.  Cardiovascular:     Rate and Rhythm: Normal rate and regular rhythm.  Pulmonary:     Breath sounds: No wheezing, rhonchi or rales.  Psychiatric:        Mood and Affect: Mood normal.        Behavior: Behavior normal.      Assessment & Plan:   Opioid use disorder, moderate, dependence (HCC) Current daily dose: Buprenorphine HCl-Naloxone HCl (SUBOXONE) 2-0.5 MG FILM Date of Induction: 06/02/2022 Current follow up interval, in weeks: 8 The patient has been adherent with the buprenorphine for OUD contract.  Last UDS Result: 06/16/2022 . Appropriate for suboxone and metabolite. Also THC.  [name] with a history of OUD who presented for follow up of her OUD.  She reports he has been adherent to Suboxone therapy which is controlling cravings well. Denies any relapse and feels he/she is doing well on Suboxone therapy. UDS positive for THC at last visit and we discussed this is likely reason for feeling sleepy. THC has helped with anxiety, but she is cutting back on use. Follow up in 8 weeks. This can be a telephone visit. Suboxone refilled today.     Generalized anxiety disorder Took lexapro for 2 days and did not like the way it made her feel. She would like to restart mirtazapine and found this to help her with sleep and anxiety. GAD 7 is a 9.   Plan: - stop lexapro - Start mirtazapine     Lorene Dy, MD

## 2022-07-28 NOTE — Assessment & Plan Note (Addendum)
Current daily dose: Buprenorphine HCl-Naloxone HCl (SUBOXONE) 2-0.5 MG FILM Date of Induction: 06/02/2022 Current follow up interval, in weeks: 8 The patient has been adherent with the buprenorphine for OUD contract.  Last UDS Result: 06/16/2022 . Appropriate for suboxone and metabolite. Also THC.  This is a 59 old with a history of OUD who presented for follow up of her OUD.  She reports he has been adherent to Suboxone therapy which is controlling cravings well. Denies any relapse and feels he/she is doing well on Suboxone therapy. UDS positive for THC at last visit and we discussed this is likely reason for feeling sleepy. THC has helped with anxiety, but she is cutting back on use. Follow up in 8 weeks. This can be a telephone visit. Suboxone refilled today.

## 2022-07-28 NOTE — Patient Instructions (Signed)
Thank you for trusting me with your care. To recap, today we discussed the following:   - Stop Lexapro , Start Mirtazapine  - buprenorphine-naloxone (SUBOXONE) 2-0.5 mg SUBL SL tablet; Place 1 tablet under the tongue daily.  Dispense: 28 tablet; Refill: 0

## 2022-07-28 NOTE — Assessment & Plan Note (Signed)
Took lexapro for 2 days and did not like the way it made her feel. She would like to restart mirtazapine and found this to help her with sleep and anxiety. GAD 7 is a 9.   Plan: - stop lexapro - Start mirtazapine

## 2022-08-23 ENCOUNTER — Ambulatory Visit
Admission: EM | Admit: 2022-08-23 | Discharge: 2022-08-23 | Disposition: A | Discharge status: Home / Self Care | Payer: Medicaid Other | Attending: Family Medicine | Admitting: Family Medicine

## 2022-08-23 DIAGNOSIS — S39012A Strain of muscle, fascia and tendon of lower back, initial encounter: Secondary | ICD-10-CM

## 2022-08-23 LAB — URINALYSIS, W/ REFLEX TO CULTURE (INFECTION SUSPECTED)
Bilirubin Urine: NEGATIVE
Glucose, UA: NEGATIVE mg/dL
Hgb urine dipstick: NEGATIVE
Ketones, ur: 40 mg/dL — AB
Leukocytes,Ua: NEGATIVE
Nitrite: NEGATIVE
Protein, ur: 30 mg/dL — AB
Specific Gravity, Urine: 1.02 (ref 1.005–1.030)
pH: 6 (ref 5.0–8.0)

## 2022-08-23 MED ORDER — METHOCARBAMOL 500 MG PO TABS: 500.0000 mg | ORAL_TABLET | Freq: Two times a day (BID) | ORAL | 0 refills | Status: DC

## 2022-08-23 MED ORDER — KETOROLAC TROMETHAMINE 60 MG/2ML IM SOLN
30.0000 mg | Freq: Once | INTRAMUSCULAR | Status: AC
  Administered 2022-08-23: 30 mg via INTRAMUSCULAR

## 2022-08-23 NOTE — ED Triage Notes (Signed)
Sx started Wednesday. Takes suboxin. Hasn't taken since Sunday. Is out of script. Gets again 3-2. Thinks she strained her back at work. Hasn't taken anything for this. Hasn't ate or drank really since Wednesday.
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
## 2022-08-23 NOTE — Discharge Instructions (Addendum)
Take 3-100 mg Tylenol twice a day for pain with the muscle relaxer.  It does not appear that you have a urinary tract infection.  Stop by the pharmacy to pick up your prescriptions.  Ask your primary care provider about extending your buprenorphine to cover 30 days instead of 28.  Talk with them about your anxiety and consider propranolol.

## 2022-08-28 ENCOUNTER — Other Ambulatory Visit: Payer: Self-pay | Admitting: Internal Medicine

## 2022-08-28 ENCOUNTER — Telehealth: Payer: Self-pay | Admitting: Family Medicine

## 2022-08-28 DIAGNOSIS — F1120 Opioid dependence, uncomplicated: Secondary | ICD-10-CM

## 2022-08-28 MED ORDER — BUPRENORPHINE HCL-NALOXONE HCL 2-0.5 MG SL SUBL: 1.0000 | SUBLINGUAL_TABLET | Freq: Every day | SUBLINGUAL | 0 refills | Status: DC

## 2022-08-28 NOTE — Telephone Encounter (Signed)
Pt states she is supposed have a refill but phar is stating she does not have one. She is going out of town on Monday & needs the medication before she can leave. Can you please refill?   Prescription Request  08/28/2022   LOV: 10/31/2021  What is the name of the medication or equipment? buprenorphine-naloxone (SUBOXONE) 2-0.5 mg SUBL SL tablet   Have you contacted your pharmacy to request a refill? Yes   Which pharmacy would you like this sent to?  [facility] - [place], [place] [address] [place] [address] Phone: [phone] Fax: [phone]  [id number] [place], [id number]: [phone]: [phone]   Patient notified that their request is being sent to the clinical staff for review and that they should receive a response within 2 business days.   Please advise at [facility]

## 2022-09-26 ENCOUNTER — Encounter: Payer: Self-pay | Admitting: Family Medicine

## 2022-09-26 ENCOUNTER — Telehealth (INDEPENDENT_AMBULATORY_CARE_PROVIDER_SITE_OTHER): Payer: Medicaid Other | Admitting: Family Medicine

## 2022-09-26 DIAGNOSIS — F1120 Opioid dependence, uncomplicated: Secondary | ICD-10-CM

## 2022-09-26 MED ORDER — BUPRENORPHINE HCL-NALOXONE HCL 2-0.5 MG SL SUBL: 1.0000 | SUBLINGUAL_TABLET | Freq: Every day | SUBLINGUAL | 0 refills | Status: DC

## 2022-09-26 NOTE — Progress Notes (Signed)
   Virtual Visit via Video Note  I connected with Joyce [name] on 09/26/22 at 10:40 AM EDT by a video enabled telemedicine application and verified that I am speaking with the correct person using two identifiers.  Patient Location: Home Provider Location: Office/Clinic  I discussed the limitations, risks, security, and privacy concerns of performing an evaluation and management service by video and the availability of in person appointments. I also discussed with the patient that there may be a patient responsible charge related to this service. The patient expressed understanding and agreed to proceed.  Subjective: PCP: Alvira Monday, FNP  Chief Complaint  Patient presents with   Follow-up    Pt is following up for suboxene, states she is in recovery. Needs an rx for anxiety as well.    HPI The patient is in today requesting refills of her suboxone. She c/o increased panic attacks and would like treatment. She reports being told by the ED provider on 08/23/22 to ask her PCP about starting propranolol for her panic attacks. She denies SI and HI.   ROS: Per HPI  Current Outpatient Medications:    hydrochlorothiazide (HYDRODIURIL) 25 MG tablet, TAKE 1 TABLET BY MOUTH DAILY, Disp: 90 tablet, Rfl: 0   medroxyPROGESTERone Acetate 150 MG/ML SUSY, Inject 1 mL (150 mg total) into the muscle every 3 (three) months., Disp: 1 mL, Rfl: 3   methocarbamol (ROBAXIN) 500 MG tablet, Take 1 tablet (500 mg total) by mouth 2 (two) times daily., Disp: 30 tablet, Rfl: 0   mirtazapine (REMERON) 15 MG tablet, Take 1 tablet (15 mg total) by mouth at bedtime., Disp: 90 tablet, Rfl: 0   buprenorphine-naloxone (SUBOXONE) 2-0.5 mg SUBL SL tablet, Place 1 tablet under the tongue daily., Disp: 30 tablet, Rfl: 0  Current Facility-Administered Medications:    medroxyPROGESTERone (DEPO-PROVERA) injection 150 mg, 150 mg, Intramuscular, Once, Rod Can, CNM  Observations/Objective: There were no vitals  filed for this visit. Physical Exam Patient is well-developed, well-nourished in no acute distress.  Resting comfortably at home.  Head is normocephalic, atraumatic.  No labored breathing.  Speech is clear and coherent with logical content.  Patient is alert and oriented at baseline.   Assessment and Plan: Opioid use disorder, moderate, dependence (HCC) -     Buprenorphine HCl-Naloxone HCl; Place 1 tablet under the tongue daily.  Dispense: 30 tablet; Refill: 0  Encouraged the patient to follow up in 1 week to assess her blood pressure and assess her labs before starting her on propranolol for panic attacks. She reports that mirtazapine makes her drowsy.   Follow Up Instructions: 1 week  I discussed the assessment and treatment plan with the patient. The patient was provided an opportunity to ask questions, and all were answered. The patient agreed with the plan and demonstrated an understanding of the instructions.   The patient was advised to call back or seek an in-person evaluation if the symptoms worsen or if the condition fails to improve as anticipated.  The above assessment and management plan was discussed with the patient. The patient verbalized understanding of and has agreed to the management plan.   Alvira Monday, FNP

## 2022-10-06 ENCOUNTER — Ambulatory Visit: Payer: Medicaid Other | Admitting: Internal Medicine

## 2022-10-06 ENCOUNTER — Ambulatory Visit (INDEPENDENT_AMBULATORY_CARE_PROVIDER_SITE_OTHER): Payer: Medicaid Other

## 2022-10-06 ENCOUNTER — Encounter: Payer: Self-pay | Admitting: Internal Medicine

## 2022-10-06 VITALS — BP 120/80 | Ht 69.0 in | Wt 130.0 lb

## 2022-10-06 VITALS — BP 139/90 | HR 70 | Ht 69.0 in | Wt 131.0 lb

## 2022-10-06 DIAGNOSIS — F1120 Opioid dependence, uncomplicated: Secondary | ICD-10-CM | POA: Diagnosis not present

## 2022-10-06 DIAGNOSIS — Z3042 Encounter for surveillance of injectable contraceptive: Secondary | ICD-10-CM | POA: Diagnosis not present

## 2022-10-06 DIAGNOSIS — F411 Generalized anxiety disorder: Secondary | ICD-10-CM | POA: Diagnosis not present

## 2022-10-06 DIAGNOSIS — I10 Essential (primary) hypertension: Secondary | ICD-10-CM | POA: Diagnosis not present

## 2022-10-06 MED ORDER — MEDROXYPROGESTERONE ACETATE 150 MG/ML IM SUSP
150.0000 mg | Freq: Once | INTRAMUSCULAR | Status: AC
  Administered 2022-10-06: 150 mg via INTRAMUSCULAR

## 2022-10-06 MED ORDER — OLMESARTAN MEDOXOMIL 20 MG PO TABS
20.0000 mg | ORAL_TABLET | Freq: Every day | ORAL | 0 refills | Status: DC
Start: 2022-10-06 — End: 2023-02-06

## 2022-10-06 NOTE — Assessment & Plan Note (Signed)
Patient is having 1-2 panic attacks per month.  She has not been established with counselor yet and believes this is because she did not follow-up with referral.  Order for integrated behavioral health placed. Continue mirtazapine

## 2022-10-06 NOTE — Progress Notes (Signed)
    NURSE VISIT NOTE  Subjective:    Patient ID: BRENESHA GOVEIA, female    DOB: [birth year], 27 y.o.   MRN: [id number]  HPI  Patient is a 27 y.o. [id number] female who presents for depo provera injection.   Objective:    BP 120/80 (BP Location: Left Arm)   Ht 5\' 9"  (1.753 m)   Wt 130 lb (59 kg)   LMP 10/04/2022   BMI 19.20 kg/m   Last Annual: 07/12/20. Last pap: 07/12/20. Last Depo-Provera: 07/15/22. Side Effects if any: none. Serum HCG indicated? No . Depo-Provera 150 mg IM given by: Cornelius Moras, CMA. Site: Right Ventrogluteal  Lab Review  @THIS  VISIT ONLY@  Assessment:   1. Encounter for surveillance of injectable contraceptive      Plan:   Next appointment due between June 24 and January 05, 2023.    Cornelius Moras, CMA

## 2022-10-06 NOTE — Patient Instructions (Signed)
Contraceptive Injection A contraceptive injection is a shot that prevents pregnancy. It is also called a birth control shot. The shot contains the hormone progestin, which prevents pregnancy by: Stopping the ovaries from releasing eggs. Thickening cervical mucus to prevent sperm from entering the cervix. Thinning the lining of the uterus to prevent a fertilized egg from attaching to the uterus. Contraceptive injections are given under the skin (subcutaneous) or into a muscle (intramuscular). For these shots to work, you must get one of them every 3 months (12-13 weeks) from a health care provider. Tell a health care provider about: Any allergies you have. All medicines you are taking, including vitamins, herbs, eye drops, creams, and over-the-counter medicines. Any blood disorders you have. Any medical conditions you have. Whether you are pregnant or may be pregnant. What are the risks? Generally, this is a safe procedure. However, problems may occur, including: Mood changes or depression. Loss of bone density (osteoporosis) after long-term use. Blood clots. This is rare. Higher risk of an egg being fertilized outside your uterus (ectopic pregnancy).This is rare. What happens before the procedure? Your health care provider may do a routine physical exam. You may have a test to make sure you are not pregnant. What happens during the procedure?  The area where the shot will be given will be cleaned and sanitized with alcohol. A needle will be inserted into a muscle in your upper arm or buttock, or into the skin of your thigh or abdomen. The needle will be attached to a syringe with the medicine inside of it. The medicine will be pushed through the syringe and injected into your body. A small bandage (dressing) may be placed over the injection site. What can I expect after the procedure? After the procedure, it is common to have: Soreness around the injection site for a couple of  days. Irregular menstrual bleeding. Weight gain. Breast tenderness. Headaches. Discomfort in your abdomen. Ask your health care provider whether you need to use an added method of birth control (backup contraception), such as a condom, sponge, or spermicide. If the first shot is given 1-7 days after the start of your last menstrual period, you will not need backup contraception. If the first shot is given at any other time during your menstrual cycle, you should avoid having sex. If you do have sex, you will need to use backup contraception for 7 days after you receive the shot. Follow these instructions at home: General instructions Take over-the-counter and prescription medicines only as told by your health care provider. Do not rub or massage the injection site. Track your menstrual periods so you will know if they become irregular. Always use a condom to protect against sexually transmitted infections (STIs). Make sure you schedule an appointment in time for your next shot and mark it on your calendar. You must get an injection every 3 months (12-13 weeks) to prevent pregnancy. Lifestyle Do not use any products that contain nicotine or tobacco. These products include cigarettes, chewing tobacco, and vaping devices, such as e-cigarettes. If you need help quitting, ask your health care provider. Eat foods that are high in calcium and vitamin D, such as milk, cheese, and salmon. Doing this may help with any loss in bone density caused by the contraceptive injection. Ask your health care provider for dietary recommendations. Contact a health care provider if you: Have nausea or vomiting. Have abnormal vaginal discharge or bleeding. Miss a menstrual period or think you might be pregnant. Experience mood changes   or depression. Feel dizzy or light-headed. Have leg pain. Get help right away if you: Have chest pain or cough up blood. Have shortness of breath. Have a severe headache that does  not go away. Have numbness in any part of your body. Have slurred speech or vision problems. Have vaginal bleeding that is abnormally heavy or does not stop, or you have severe pain in your abdomen. Have depression that does not get better with treatment. If you ever feel like you may hurt yourself or others, or have thoughts about taking your own life, get help right away. Go to your nearest emergency department or: Call your local emergency services (911 in the [place] Call a suicide crisis helpline, such as the National Suicide Prevention Lifeline at 1-800-273-8255 or 988 in the [place] This is open 24 hours a day in the [place] Text the Crisis Text Line at 741741 (in the [place] Summary A contraceptive injection is a shot that prevents pregnancy. It is also called the birth control shot. The shot is given under the skin (subcutaneous) or into a muscle (intramuscular). After this procedure, it is common to have soreness around the injection site for a couple of days. To prevent pregnancy, the shot must be given by a health care provider every 3 months (12-13 weeks). After you have the shot, ask your health care provider whether you need to use an added method of birth control (backup contraception), such as a condom, sponge, or spermicide. This information is not intended to replace advice given to you by your health care provider. Make sure you discuss any questions you have with your health care provider. Document Revised: 01/09/2021 Document Reviewed: 12/26/2019 Elsevier Patient Education  2023 [employer]  

## 2022-10-06 NOTE — Progress Notes (Signed)
   HPI:Joyce [name] is a 27 y.o. female who presents for evaluation of Follow-up (1 week follow up) . For the details of today's visit, please refer to the assessment and plan.  Physical Exam: Vitals:   10/06/22 1021 10/06/22 1024  BP: (!) 143/97 (!) 139/90  Pulse: 70   SpO2: 98%   Weight: 131 lb 0.6 oz (59.4 kg)   Height: 5\' 9"  (1.753 m)      Physical Exam Constitutional:      Appearance: She is well-developed and well-groomed.  Eyes:     General: No scleral icterus.    Conjunctiva/sclera: Conjunctivae normal.  Cardiovascular:     Rate and Rhythm: Normal rate and regular rhythm.     Heart sounds: No murmur heard.    No friction rub. No gallop.  Pulmonary:     Effort: Pulmonary effort is normal.     Breath sounds: No wheezing, rhonchi or rales.  Musculoskeletal:     Right lower leg: No edema.     Left lower leg: No edema.  Skin:    General: Skin is warm and dry.      Assessment & Plan:   Joyce [name] was seen today for follow-up.  Essential hypertension Assessment & Plan: Patient only taking HCTZ 25 mg at this time.  Her blood pressure is 139/90 today.  She has a history of preeclampsia.  Review of her kidney function show it has been overall stable since 2015. She has CKD Stage 3A.  Microalbumin/creatinine 23 05/2022.  -Stop HCTZ 25 mg - Take lisinopril 10 mg - BMP  Orders: -     Olmesartan Medoxomil; Take 1 tablet (20 mg total) by mouth daily.  Dispense: 90 tablet; Refill: 0 -     BMP8+EGFR  Opioid use disorder, moderate, dependence Assessment & Plan: Suboxone refilled at Telephone visit 1 week ago.   Check UDS as part of routine monitoring.   Orders: -     ToxASSURE Select 13 (MW), Urine  Generalized anxiety disorder Assessment & Plan: Patient is having 1-2 panic attacks per month.  She has not been established with counselor yet and believes this is because she did not follow-up with referral.  Order for integrated behavioral health placed. Continue  mirtazapine  Orders: -     Amb ref to Integrated Behavioral Health      Milus Banister, MD

## 2022-10-06 NOTE — Assessment & Plan Note (Addendum)
Patient only taking HCTZ 25 mg at this time.  Her blood pressure is 139/90 today.  She has a history of preeclampsia.  Review of her kidney function show it has been overall stable since 2015. She has CKD Stage 3A.  Microalbumin/creatinine 23 05/2022.  -Stop HCTZ 25 mg - Take lisinopril 10 mg - BMP

## 2022-10-06 NOTE — Assessment & Plan Note (Addendum)
Suboxone refilled at Telephone visit 1 week ago.   Check UDS as part of routine monitoring.

## 2022-10-06 NOTE — Patient Instructions (Signed)
Thank you, Ms.Fedora NEFRETIRI OLIVERIA for allowing [place] to provide your care today.   I have ordered the following labs for you:   Lab Orders         ToxASSURE Select 13 (MW), Urine         BMP8+EGFR      Referral placed to integrated behavioral health Start Olmesartan . Stop hydrochlorothiazide.    Reminders: Check blood pressure at home and follow-up in 6 weeks with 10 readings.    Thurmon Fair, M.D.

## 2022-10-07 LAB — BMP8+EGFR
BUN/Creatinine Ratio: 14 (ref 9–23)
BUN: 19 mg/dL (ref 6–20)
CO2: 22 mmol/L (ref 20–29)
Calcium: 9.9 mg/dL (ref 8.7–10.2)
Chloride: 100 mmol/L (ref 96–106)
Creatinine, Ser: 1.4 mg/dL — ABNORMAL HIGH (ref 0.57–1.00)
Glucose: 88 mg/dL (ref 70–99)
Potassium: 3.6 mmol/L (ref 3.5–5.2)
Sodium: 140 mmol/L (ref 134–144)
eGFR: 53 mL/min/{1.73_m2} — ABNORMAL LOW (ref >59)

## 2022-10-10 LAB — TOXASSURE SELECT 13 (MW), URINE

## 2022-10-30 ENCOUNTER — Other Ambulatory Visit: Payer: Self-pay | Admitting: Family Medicine

## 2022-10-30 ENCOUNTER — Telehealth: Payer: Self-pay | Admitting: Family Medicine

## 2022-10-30 DIAGNOSIS — F1120 Opioid dependence, uncomplicated: Secondary | ICD-10-CM

## 2022-10-30 NOTE — Telephone Encounter (Signed)
Prescription Request  10/30/2022  LOV: 10/31/2021  What is the name of the medication or equipment? buprenorphine-naloxone (SUBOXONE) 2-0.5 mg SUBL SL tablet   Have you contacted your pharmacy to request a refill? No   Which pharmacy would you like this sent to?   [id number] - [place], [place] - [address] [place] [address] Phone: [phone] Fax: [phone]    Patient notified that their request is being sent to the clinical staff for review and that they should receive a response within 2 business days.   Please advise at Mobile [phone] (mobile)

## 2022-10-31 ENCOUNTER — Other Ambulatory Visit: Payer: Self-pay | Admitting: Family Medicine

## 2022-10-31 DIAGNOSIS — F1120 Opioid dependence, uncomplicated: Secondary | ICD-10-CM

## 2022-10-31 NOTE — Telephone Encounter (Signed)
Pt states that someone called her around 4:30 yesterday stating they were sending her refills in. No refill were sent in. Patient wants a cll back.

## 2022-11-02 MED ORDER — BUPRENORPHINE HCL-NALOXONE HCL 2-0.5 MG SL SUBL
1.0000 | SUBLINGUAL_TABLET | Freq: Every day | SUBLINGUAL | 0 refills | Status: DC
Start: 2022-11-02 — End: 2022-11-26

## 2022-11-03 ENCOUNTER — Other Ambulatory Visit: Payer: Self-pay | Admitting: Internal Medicine

## 2022-11-03 NOTE — Telephone Encounter (Signed)
Spoke to pharmacy medication is ready for pick up, called pt to let her know.

## 2022-11-04 ENCOUNTER — Other Ambulatory Visit (HOSPITAL_COMMUNITY)
Admission: RE | Admit: 2022-11-04 | Discharge: 2022-11-04 | Disposition: A | Discharge status: Home / Self Care | Payer: Medicaid Other | Source: Ambulatory Visit | Attending: Obstetrics | Admitting: Obstetrics

## 2022-11-04 ENCOUNTER — Encounter: Payer: Self-pay | Admitting: Obstetrics

## 2022-11-04 ENCOUNTER — Ambulatory Visit (INDEPENDENT_AMBULATORY_CARE_PROVIDER_SITE_OTHER): Payer: Medicaid Other | Admitting: Obstetrics

## 2022-11-04 VITALS — BP 116/79 | HR 72 | Ht 69.0 in | Wt 128.0 lb

## 2022-11-04 DIAGNOSIS — Z113 Encounter for screening for infections with a predominantly sexual mode of transmission: Secondary | ICD-10-CM | POA: Diagnosis not present

## 2022-11-04 DIAGNOSIS — Z01419 Encounter for gynecological examination (general) (routine) without abnormal findings: Secondary | ICD-10-CM | POA: Diagnosis not present

## 2022-11-04 NOTE — Progress Notes (Signed)
ANNUAL GYNECOLOGICAL EXAM  SUBJECTIVE  HPI  Joyce [name] is a 27 y.o.-year-old [id number] who presents for an annual gynecological exam today.  She denies pelvic pain, dyspareunia, abnormal vaginal bleeding or discharge, and UTI symptoms. She is concerned about lack of libido. She does not currently have a partner, but has felt a significantly decreased sex drive since the birth of her children. She is constantly busy with work and parenting and has limited social support. She is being treated for hypertension, anxiety, and kidney disease by her PCP. She would like STI screening today. She uses Depo for contraception and is happy with methodd.  Medical/Surgical History Past Medical History:  Diagnosis Date   Allergy    seasonal   Anxiety    Elevated blood-pressure reading without diagnosis of hypertension 12/26/2014   Elevated serum creatinine 12/26/2014   Gestational hypertension 02/12/2015   H/O acute renal failure 12/26/2014   Heart murmur    was told that she had beginning stages   Migraine    Post-viral cough syndrome 06/20/2019   Renal dysfunction    Renal dysfunction 12/26/2014   S/P thyroid surgery 12/26/2014   Severe preeclampsia    Short interval between pregnancies complicating pregnancy in third trimester, antepartum    Substance abuse (HCC)    uses marijuanna   Tension headache 11/11/2018   Thyroid disease    right side removed   Underweight 12/26/2014   Past Surgical History:  Procedure Laterality Date   THYROID SURGERY      Social History Lives with daughter. Feels safe there Work: [employer] Exercise: Tae kwon do Substances: Denies EtOH. Former smoker, current Advertising account planner and MJ use  Obstetric History OB History     Gravida  3   Para  2   Term  1   Preterm  1   AB  1   Living  2      SAB      IAB  1   Ectopic      Multiple  0   Live Births  2            GYN/Menstrual History No LMP recorded (lmp unknown). Patient has had an  injection. Last Pap: 06/11/2021 - NILM Contraception: Depo  Prevention Dentist: plans to see one soon Eye exam: has not had Mammogram: at 39 Colonoscopy: at 44 Flu shot/vaccines: will find out if she's had HPV vaccine  Current Medications Outpatient Medications Prior to Visit  Medication Sig   buprenorphine-naloxone (SUBOXONE) 2-0.5 mg SUBL SL tablet Place 1 tablet under the tongue daily.   medroxyPROGESTERone Acetate 150 MG/ML SUSY Inject 1 mL (150 mg total) into the muscle every 3 (three) months.   methocarbamol (ROBAXIN) 500 MG tablet Take 1 tablet (500 mg total) by mouth 2 (two) times daily.   olmesartan (BENICAR) 20 MG tablet Take 1 tablet (20 mg total) by mouth daily.   mirtazapine (REMERON) 15 MG tablet Take 1 tablet (15 mg total) by mouth at bedtime.   Facility-Administered Medications Prior to Visit  Medication Dose Route Frequency Provider   medroxyPROGESTERone (DEPO-PROVERA) injection 150 mg  150 mg Intramuscular Once Tresea Mall, CNM      [facility] - 11/04/22 4782       Pregnancy Intention Screening   Does the patient want to become pregnant in the next year? No    Does the patient's partner want to become pregnant in the next year? No    Would the patient like to discuss contraceptive  options today? No      Contraception Wrap Up   Current Method Hormonal Injection    End Method Hormonal Injection    Contraception Counseling Provided No    How was the end contraceptive method provided? N/A            The pregnancy intention screening data noted above was reviewed. Potential methods of contraception were discussed. The patient elected to proceed with Hormonal Injection.   ROS Constitutional: Denied constitutional symptoms, night sweats, recent illness, fatigue, fever, insomnia and weight loss.  Eyes: Denied eye symptoms, eye pain, photophobia, vision change and visual disturbance.  Ears/Nose/Throat/Neck: Denied ear, nose, throat or neck symptoms, hearing  loss, nasal discharge, sinus congestion and sore throat.  Cardiovascular: Denied cardiovascular symptoms, arrhythmia, chest pain/pressure, edema, exercise intolerance, orthopnea and palpitations.  Respiratory: Denied pulmonary symptoms, asthma, pleuritic pain, productive sputum, cough, dyspnea and wheezing.  Gastrointestinal: Denied, gastro-esophageal reflux, melena, nausea and vomiting.  Genitourinary: Denied genitourinary symptoms including symptomatic vaginal discharge, pelvic relaxation issues, and urinary complaints.  Musculoskeletal: Denied musculoskeletal symptoms, stiffness, swelling, muscle weakness and myalgia.  Dermatologic: Denied dermatology symptoms, rash and scar.  Neurologic: Denied neurology symptoms, dizziness, headache, neck pain and syncope.  Psychiatric: Denied psychiatric symptoms, anxiety and depression.  Endocrine: Denied endocrine symptoms including hot flashes and night sweats.    OBJECTIVE  Ht 5\' 9"  (1.753 m)   Wt 128 lb (58.1 kg)   LMP  (LMP Unknown)   BMI 18.90 kg/m    Physical examination General NAD, Conversant  HEENT Atraumatic; Op clear with mmm.  Normo-cephalic. Pupils reactive. Anicteric sclerae  Thyroid/Neck Smooth without nodularity or enlargement, right side absent. Normal ROM.  Neck Supple.  Skin No rashes, lesions or ulceration. Normal palpated skin turgor. No nodularity.  Breasts: No masses or discharge.  Symmetric.  No axillary adenopathy.  Lungs: Clear to auscultation.No rales or wheezes. Normal Respiratory effort, no retractions.  Heart: NSR.  No murmurs or rubs appreciated. No peripheral edema  Abdomen: Soft.  Non-tender.  No masses.  No HSM. No hernia  Extremities: Moves all appropriately.  Normal ROM for age. No lymphadenopathy.  Neuro: Oriented to PPT.  Normal mood. Normal affect.     Pelvic:   Vulva: Normal appearance.  No lesions.Swab collected.    ASSESSMENT  1) Annual exam 2) Decreased libido  PLAN 1) Physical exam as noted.  Discussed healthy lifestyle choices and preventive care. STI swabs collected. Routine labs done with PCP. Pap due in 2025. 2) Discussed causes of low libido, including overwhelmingly busy lifestyle, medications, concerns for STIs. Consider trying to take time for herself, switching medications if desires, finding a supportive partner.   Return in one year for annual exam and Pap smear or as needed for concerns.   Guadlupe Spanish, CNM

## 2022-11-05 ENCOUNTER — Encounter: Payer: Self-pay | Admitting: Obstetrics

## 2022-11-05 ENCOUNTER — Other Ambulatory Visit: Payer: Self-pay | Admitting: Obstetrics

## 2022-11-05 LAB — HEPATITIS C ANTIBODY: Hep C Virus Ab: NONREACTIVE

## 2022-11-05 LAB — RPR: RPR Ser Ql: NONREACTIVE

## 2022-11-05 LAB — CERVICOVAGINAL ANCILLARY ONLY
Bacterial Vaginitis (gardnerella): POSITIVE — AB
Candida Glabrata: NEGATIVE
Candida Vaginitis: NEGATIVE
Chlamydia: NEGATIVE
Comment: NEGATIVE
Comment: NEGATIVE
Comment: NEGATIVE
Comment: NEGATIVE
Comment: NEGATIVE
Comment: NORMAL
Neisseria Gonorrhea: NEGATIVE
Trichomonas: NEGATIVE

## 2022-11-05 LAB — HEPATITIS B SURFACE ANTIGEN: Hepatitis B Surface Ag: NEGATIVE

## 2022-11-05 LAB — HIV ANTIBODY (ROUTINE TESTING W REFLEX): HIV Screen 4th Generation wRfx: NONREACTIVE

## 2022-11-05 LAB — HEPATITIS B SURFACE ANTIBODY,QUALITATIVE: Hep B Surface Ab, Qual: NONREACTIVE

## 2022-11-05 MED ORDER — METRONIDAZOLE 500 MG PO TABS: 500.0000 mg | ORAL_TABLET | Freq: Two times a day (BID) | ORAL | 0 refills | Status: DC

## 2022-11-05 NOTE — Progress Notes (Signed)
+  BV. Rx for metronidazole 500 mg PO BID x 7 days sent to pharmacy. Ava notified via MyChart.  M. Chryl Heck, CNM

## 2022-11-17 ENCOUNTER — Ambulatory Visit: Payer: Medicaid Other | Admitting: Family Medicine

## 2022-11-26 ENCOUNTER — Encounter: Payer: Self-pay | Admitting: Family Medicine

## 2022-11-26 ENCOUNTER — Ambulatory Visit: Payer: Medicaid Other | Admitting: Family Medicine

## 2022-11-26 VITALS — BP 114/75 | HR 61 | Ht 69.0 in | Wt 125.1 lb

## 2022-11-26 DIAGNOSIS — F1120 Opioid dependence, uncomplicated: Secondary | ICD-10-CM

## 2022-11-26 DIAGNOSIS — F411 Generalized anxiety disorder: Secondary | ICD-10-CM | POA: Diagnosis not present

## 2022-11-26 DIAGNOSIS — I10 Essential (primary) hypertension: Secondary | ICD-10-CM | POA: Diagnosis not present

## 2022-11-26 DIAGNOSIS — F419 Anxiety disorder, unspecified: Secondary | ICD-10-CM | POA: Diagnosis not present

## 2022-11-26 MED ORDER — BUPRENORPHINE HCL-NALOXONE HCL 2-0.5 MG SL SUBL
1.0000 | SUBLINGUAL_TABLET | Freq: Every day | SUBLINGUAL | 0 refills | Status: DC
Start: 2022-11-26 — End: 2022-12-22

## 2022-11-26 MED ORDER — BUSPIRONE HCL 7.5 MG PO TABS
7.50 mg | ORAL_TABLET | Freq: Two times a day (BID) | ORAL | 3 refills | Status: DC
Start: 2022-11-26 — End: 2023-01-06

## 2022-11-26 MED ORDER — BUSPIRONE HCL 7.5 MG PO TABS: 7.5000 mg | ORAL_TABLET | Freq: Two times a day (BID) | ORAL | 1 refills | Status: DC

## 2022-11-26 NOTE — Patient Instructions (Signed)
        Great to see you today.  I have refilled the medication(s) we provide.    - Please take medications as prescribed. - Follow up with your primary health provider if any health concerns arises. - If symptoms worsen please contact your primary care provider and/or visit the emergency department.  

## 2022-11-26 NOTE — Assessment & Plan Note (Signed)
    11/26/2022   12:07 PM 11/26/2022   11:22 AM 10/06/2022   10:24 AM 09/26/2022   10:32 AM  GAD 7 : Generalized Anxiety Score  Nervous, Anxious, on Edge 2 0 0 3  Control/stop worrying 3 0 3 3  Worry too much - different things 3 0 3 3  Trouble relaxing 1 0 3 3  Restless 2 0 3 3  Easily annoyed or irritable 1 0 0 3  Afraid - awful might happen 3 0 3   Total GAD 7 Score 15 0 15   Anxiety Difficulty Very difficult Not difficult at all  Very difficult  Continue mirtazapine at bedtime Started buspar 7.5 mg twice daily  Continued discussion on lifestyle changes, establishing a daily routine, going outdoors, exercise, healthy eating habits, mindfulness and mediatation.  Follow up in 6 weeks Referral placed to behavior health services   Patient verbally consented to [facility] services about presenting concerns and psychiatric consultation as appropriate. The services will be billed as appropriate for the patient.
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
## 2022-11-26 NOTE — Assessment & Plan Note (Signed)
Vitals:   11/26/22 1120  BP: 114/75  Blood pressure controlled in today's visit Continue Olmesartan 20 mg daily Continued discussion on DASH diet, low sodium diet and maintain a exercise routine for 150 minutes per week.

## 2022-12-19 DIAGNOSIS — Z3042 Encounter for surveillance of injectable contraceptive: Secondary | ICD-10-CM | POA: Insufficient documentation

## 2022-12-19 NOTE — Progress Notes (Deleted)
    NURSE VISIT NOTE  Subjective:    Patient ID: KIERSTEN COSS, female    DOB: March 06, 1996, 27 y.o.   MRN: [id number]  HPI  Patient is a 27 y.o. [id number] female who presents for depo provera injection.   Objective:    There were no vitals taken for this visit.  Last Annual: 11/04/22. Last pap: 06/11/21. Last Depo-Provera: 10/05/22. Side Effects if any: none. Serum HCG indicated? No . Depo-Provera 150 mg IM given by: Rocco Serene, LPN. Site: Left Upper [place]er [place]  Lab Review  @THIS  VISIT ONLY@  Assessment:   1. Encounter for surveillance of injectable contraceptive      Plan:   Next appointment due between 9/3 and 03/24/23.    Rocco Serene, LPN

## 2022-12-22 ENCOUNTER — Other Ambulatory Visit: Payer: Self-pay | Admitting: Family Medicine

## 2022-12-22 ENCOUNTER — Telehealth: Payer: Self-pay | Admitting: Family Medicine

## 2022-12-22 DIAGNOSIS — F1120 Opioid dependence, uncomplicated: Secondary | ICD-10-CM

## 2022-12-22 MED ORDER — BUPRENORPHINE HCL-NALOXONE HCL 2-0.5 MG SL SUBL
1.0000 | SUBLINGUAL_TABLET | Freq: Every day | SUBLINGUAL | 0 refills | Status: DC
Start: 2022-12-22 — End: 2023-02-25

## 2022-12-22 NOTE — Telephone Encounter (Signed)
Rx sent 

## 2022-12-22 NOTE — Telephone Encounter (Signed)
Prescription Request  12/22/2022  LOV: Visit date not found  What is the name of the medication or equipment? buprenorphine-naloxone (SUBOXONE) 2-0.5 mg SUBL SL tablet   Have you contacted your pharmacy to request a refill? Yes   Which pharmacy would you like this sent to?    [id number] - [place], [place] - [address] [place] [address] Phone: [phone] Fax: [phone]    Patient notified that their request is being sent to the clinical staff for review and that they should receive a response within 2 business days.   Please advise at Mobile [phone] (mobile)

## 2022-12-23 ENCOUNTER — Ambulatory Visit: Payer: Medicaid Other

## 2022-12-23 DIAGNOSIS — Z3042 Encounter for surveillance of injectable contraceptive: Secondary | ICD-10-CM

## 2022-12-26 ENCOUNTER — Ambulatory Visit (INDEPENDENT_AMBULATORY_CARE_PROVIDER_SITE_OTHER): Payer: Medicaid Other

## 2022-12-26 VITALS — BP 115/74 | HR 79 | Resp 16 | Ht 69.0 in | Wt 131.7 lb

## 2022-12-26 DIAGNOSIS — Z3042 Encounter for surveillance of injectable contraceptive: Secondary | ICD-10-CM | POA: Diagnosis not present

## 2022-12-26 MED ORDER — MEDROXYPROGESTERONE ACETATE 150 MG/ML IM SUSP
150.0000 mg | Freq: Once | INTRAMUSCULAR | Status: AC
Start: 2022-12-26 — End: 2022-12-26
  Administered 2022-12-26: 150 mg via INTRAMUSCULAR

## 2022-12-26 NOTE — Patient Instructions (Signed)

## 2022-12-26 NOTE — Progress Notes (Signed)
    NURSE VISIT NOTE  Subjective:    Patient ID: JERALDINE CLENDENNING, female    DOB: 02/19/1996, 27 y.o.   MRN: [id number]  HPI  Patient is a 27 y.o. [id number] female who presents for depo provera injection.   Objective:    BP 115/74   Pulse 79   Resp 16   Ht 5\' 9"  (1.753 m)   Wt 131 lb 11.2 oz (59.7 kg)   BMI 19.45 kg/m   Last Annual: 07/12/2020. Last pap: 07/12/2020. Last Depo-Provera: 10/06/2022. Side Effects if any: none. Serum HCG indicated?  N/A . Depo-Provera 150 mg IM given by: Santiago Bumpers, CMA. Site: Left Deltoid  Lab Review  @THIS  VISIT ONLY@  Assessment:   1. Encounter for surveillance of injectable contraceptive      Plan:   Next appointment due between September 13 and September 27.     Santiago Bumpers, CMA [facility]

## 2023-01-06 ENCOUNTER — Ambulatory Visit: Payer: Medicaid Other | Admitting: Family Medicine

## 2023-01-06 ENCOUNTER — Encounter: Payer: Self-pay | Admitting: Family Medicine

## 2023-01-06 VITALS — BP 128/80 | HR 92 | Ht 69.0 in | Wt 131.0 lb

## 2023-01-06 DIAGNOSIS — F419 Anxiety disorder, unspecified: Secondary | ICD-10-CM

## 2023-01-06 MED ORDER — BUSPIRONE HCL 10 MG PO TABS
10.0000 mg | ORAL_TABLET | Freq: Two times a day (BID) | ORAL | 1 refills | Status: DC
Start: 2023-01-06 — End: 2023-01-20

## 2023-01-06 NOTE — Assessment & Plan Note (Signed)
GAD-7 is 21 Reports some relief with BuSpar 7.5 mg twice daily  No reports of suicidal ideation or thoughts Review nonpharmacological ways to decrease anxiety and depression as outlined in her AVS Will increase BuSpar to 10 mg twice daily Encouraged follow-up in 6 weeks

## 2023-01-06 NOTE — Progress Notes (Signed)
Established Patient Office Visit  Subjective:  Patient ID: Joyce [name], female    DOB: Apr 28, 1996  Age: 27 y.o. MRN: [id number]  CC:  Chief Complaint  Patient presents with   Chronic Care Management    6 week f/u   Anxiety    6 week f/u, reports getting a dog has helped her with anxiety. Would like a note saying her dog is her emotional support to be able to keep her pet at her apartment.     HPI Joyce [name] is a 27 y.o. female with past medical history of anxiety  presents for f/u. For the details of today's visit, please refer to the assessment and plan.     Past Medical History:  Diagnosis Date   Allergy    seasonal   Anxiety    Elevated blood-pressure reading without diagnosis of hypertension 12/26/2014   Elevated serum creatinine 12/26/2014   Gestational hypertension 02/12/2015   H/O acute renal failure 12/26/2014   Heart murmur    was told that she had beginning stages   Migraine    Post-viral cough syndrome 06/20/2019   Renal dysfunction    Renal dysfunction 12/26/2014   S/P thyroid surgery 12/26/2014   Severe preeclampsia    Short interval between pregnancies complicating pregnancy in third trimester, antepartum    Substance abuse (HCC)    uses marijuanna   Tension headache 11/11/2018   Thyroid disease    right side removed   Underweight 12/26/2014    Past Surgical History:  Procedure Laterality Date   THYROID SURGERY      Family History  Problem Relation Age of Onset   Hypertension Mother    Birth defects Paternal Grandmother    Hypertension Maternal Grandmother     Social History   Socioeconomic History   Marital status: Single    Spouse name: Not on file   Number of children: 2   Years of education: Not on file   Highest education level: High school graduate  Occupational History   Not on file  Tobacco Use   Smoking status: Former    Types: Cigarettes   Smokeless tobacco: Never  Vaping Use   Vaping Use: Every day  Substance and  Sexual Activity   Alcohol use: Yes    Comment: occasion   Drug use: Yes    Frequency: 1.0 times per week    Types: Marijuana   Sexual activity: Yes    Birth control/protection: Injection  Other Topics Concern   Not on file  Social History Narrative   Lives with two kids   Auberi: 5 (full time)    Jaxon: 4 (half and half) [place]    Fathers are in the lives, help take care of children      Enjoys: playing with kids, going out to eat, movies and music      Diet: Does not feel like she eats as good as she should. Does eat veggies and fruits; will drink v8   Caffeine: pepis -4-5 cans a day, has slowed down    Water: gallon daily      Wear seat belt   Does not wear sunscreen   Smoke and carbon monoxide detectors   Does not use phone while driving, unless it is a call.    Social Determinants of Health   Financial Resource Strain: Low Risk  (07/04/2020)   Overall Financial Resource Strain (CARDIA)    Difficulty of Paying Living Expenses: Not very hard  Food  Insecurity: No Food Insecurity (07/04/2020)   Hunger Vital Sign    Worried About Running Out of Food in the Last Year: Never true    Ran Out of Food in the Last Year: Never true  Transportation Needs: No Transportation Needs (07/04/2020)   PRAPARE - Administrator, Civil Service (Medical): No    Lack of Transportation (Non-Medical): No  Physical Activity: Sufficiently Active (07/04/2020)   Exercise Vital Sign    Days of Exercise per Week: 5 days    Minutes of Exercise per Session: 30 min  Stress: No Stress Concern Present (07/04/2020)   [employer] of Occupational Health - Occupational Stress Questionnaire    Feeling of Stress : Not at all  Social Connections: Socially Isolated (07/04/2020)   Social Connection and Isolation Panel [NHANES]    Frequency of Communication with Friends and Family: More than three times a week    Frequency of Social Gatherings with Friends and Family: Twice a week    Attends Religious  Services: Never    Database administrator or Organizations: No    Attends Banker Meetings: Never    Marital Status: Never married  Intimate Partner Violence: Not At Risk (07/04/2020)   Humiliation, Afraid, Rape, and Kick questionnaire    Fear of Current or Ex-Partner: No    Emotionally Abused: No    Physically Abused: No    Sexually Abused: No    Outpatient Medications Prior to Visit  Medication Sig Dispense Refill   buprenorphine-naloxone (SUBOXONE) 2-0.5 mg SUBL SL tablet Place 1 tablet under the tongue daily. 30 tablet 0   ibuprofen (ADVIL) 800 MG tablet Take 800 mg by mouth every 8 (eight) hours as needed.     olmesartan (BENICAR) 20 MG tablet Take 1 tablet (20 mg total) by mouth daily. 90 tablet 0   busPIRone (BUSPAR) 7.5 MG tablet Take 1 tablet (7.5 mg total) by mouth 2 (two) times daily. 120 tablet 3   mirtazapine (REMERON) 15 MG tablet Take 1 tablet (15 mg total) by mouth at bedtime. 90 tablet 0   Facility-Administered Medications Prior to Visit  Medication Dose Route Frequency Provider Last Rate Last Admin   medroxyPROGESTERone (DEPO-PROVERA) injection 150 mg  150 mg Intramuscular Once Tresea Mall, CNM        No Known Allergies  ROS Review of Systems  Constitutional:  Negative for chills and fever.  Eyes:  Negative for visual disturbance.  Respiratory:  Negative for chest tightness and shortness of breath.   Neurological:  Negative for dizziness and headaches.      Objective:    Physical Exam HENT:     Head: Normocephalic.     Mouth/Throat:     Mouth: Mucous membranes are moist.  Cardiovascular:     Rate and Rhythm: Normal rate.     Heart sounds: Normal heart sounds.  Pulmonary:     Effort: Pulmonary effort is normal.     Breath sounds: Normal breath sounds.  Neurological:     Mental Status: She is alert.     BP 128/80   Pulse 92   Ht 5\' 9"  (1.753 m)   Wt 131 lb 0.6 oz (59.4 kg)   SpO2 97%   BMI 19.35 kg/m  Wt Readings from Last 3  Encounters:  01/06/23 131 lb 0.6 oz (59.4 kg)  12/26/22 131 lb 11.2 oz (59.7 kg)  11/26/22 125 lb 1.9 oz (56.8 kg)    Lab Results  Component Value Date  TSH 0.456 11/01/2021   Lab Results  Component Value Date   WBC 9.8 05/11/2022   HGB 13.2 05/11/2022   HCT 40.5 05/11/2022   MCV 84.9 05/11/2022   PLT 207 05/11/2022   Lab Results  Component Value Date   NA 140 10/06/2022   K 3.6 10/06/2022   CO2 22 10/06/2022   GLUCOSE 88 10/06/2022   BUN 19 10/06/2022   CREATININE 1.40 (H) 10/06/2022   BILITOT 0.5 11/01/2021   ALKPHOS 122 (H) 11/01/2021   AST 17 11/01/2021   ALT 12 11/01/2021   PROT 6.4 11/01/2021   ALBUMIN 4.4 11/01/2021   CALCIUM 9.9 10/06/2022   ANIONGAP 6 05/11/2022   EGFR 53 (L) 10/06/2022   Lab Results  Component Value Date   CHOL 111 11/01/2021   Lab Results  Component Value Date   HDL 53 11/01/2021   Lab Results  Component Value Date   LDLCALC 47 11/01/2021   Lab Results  Component Value Date   TRIG 40 11/01/2021   Lab Results  Component Value Date   CHOLHDL 2.1 11/01/2021   Lab Results  Component Value Date   HGBA1C 5.3 11/01/2021      Assessment & Plan:  Anxiety Assessment & Plan: GAD-7 is 21 Reports some relief with BuSpar 7.5 mg twice daily  No reports of suicidal ideation or thoughts Review nonpharmacological ways to decrease anxiety and depression as outlined in her AVS Will increase BuSpar to 10 mg twice daily Encouraged follow-up in 6 weeks  Orders: -     busPIRone HCl; Take 1 tablet (10 mg total) by mouth 2 (two) times daily.  Dispense: 60 tablet; Refill: 1    Follow-up: Return in about 6 weeks (around 02/17/2023).   Gilmore Laroche, FNP

## 2023-01-06 NOTE — Patient Instructions (Addendum)
I appreciate the opportunity to provide care to you today!    Follow up:  6 weeks   Nonpharmacologic management of anxiety and depression  Mindfulness and Meditation Practices like mindfulness meditation can help reduce symptoms by promoting relaxation and present-moment awareness.  Exercise  Regular physical activity has been shown to improve mood and reduce anxiety through the release of endorphins and other neurochemicals.  Healthy Diet Eating a balanced diet rich in fruits, vegetables, whole grains, and lean proteins can support overall mental health.  Sleep Hygiene  Establishing a regular sleep routine and ensuring good sleep quality can significantly impact mood and anxiety levels.  Stress Management Techniques Activities such as yoga, tai chi, and deep breathing exercises can help manage stress.  Social Support Maintaining strong relationships and seeking support from friends, family, or support groups can provide emotional comfort and reduce feelings of isolation.  Lifestyle Modifications Reducing alcohol and caffeine intake, quitting smoking, and avoiding recreational drugs can improve symptoms.  Art and Music Therapy Engaging in creative activities like painting, drawing, or playing music can be therapeutic and help express emotions.  Light Therapy Particularly useful for seasonal affective disorder (SAD), exposure to bright light can help regulate mood. .   Attached with your AVS, you will find valuable resources for self-education. I highly recommend dedicating some time to thoroughly examine them.   Please continue to a heart-healthy diet and increase your physical activities. Try to exercise for at least five days a week.    It was a pleasure to see you and I look forward to continuing to work together on your health and well-being. Please do not hesitate to call the office if you need care or have questions about your care.  In case of emergency, please visit  the Emergency Department for urgent care, or contact our clinic at [phone] to schedule an appointment. We're here to help you!   Have a wonderful day and week. With Gratitude, Gilmore Laroche MSN, FNP-BC

## 2023-01-15 ENCOUNTER — Institutional Professional Consult (permissible substitution): Payer: Medicaid Other | Admitting: Professional Counselor

## 2023-01-20 ENCOUNTER — Other Ambulatory Visit: Payer: Self-pay | Admitting: Family Medicine

## 2023-01-20 DIAGNOSIS — F419 Anxiety disorder, unspecified: Secondary | ICD-10-CM

## 2023-02-06 ENCOUNTER — Other Ambulatory Visit: Payer: Self-pay

## 2023-02-06 DIAGNOSIS — I10 Essential (primary) hypertension: Secondary | ICD-10-CM

## 2023-02-06 MED ORDER — OLMESARTAN MEDOXOMIL 20 MG PO TABS
20.0000 mg | ORAL_TABLET | Freq: Every day | ORAL | 0 refills | Status: DC
Start: 2023-02-06 — End: 2024-02-03

## 2023-02-19 ENCOUNTER — Ambulatory Visit (INDEPENDENT_AMBULATORY_CARE_PROVIDER_SITE_OTHER): Payer: Medicaid Other | Admitting: Professional Counselor

## 2023-02-19 ENCOUNTER — Ambulatory Visit: Payer: Medicaid Other | Admitting: Family Medicine

## 2023-02-19 DIAGNOSIS — F411 Generalized anxiety disorder: Secondary | ICD-10-CM
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
## 2023-02-19 NOTE — Patient Instructions (Signed)
If your symptoms worsen or you have thoughts of suicide/homicide, PLEASE SEEK IMMEDIATE MEDICAL ATTENTION.  You may always call:   National Suicide Hotline: 988 or 800-273-8255 [place] Crisis Line: 336-832-9700 Crisis Recovery in [place]: 800-939-5911      These are available 24 hours a day, 7 days a week.  

## 2023-02-24 ENCOUNTER — Other Ambulatory Visit: Payer: Self-pay | Admitting: Family Medicine

## 2023-02-24 DIAGNOSIS — F1120 Opioid dependence, uncomplicated: Secondary | ICD-10-CM

## 2023-02-25 ENCOUNTER — Other Ambulatory Visit: Payer: Self-pay | Admitting: Family Medicine

## 2023-02-25 ENCOUNTER — Telehealth (INDEPENDENT_AMBULATORY_CARE_PROVIDER_SITE_OTHER): Payer: Medicaid Other | Admitting: Professional Counselor

## 2023-02-25 DIAGNOSIS — F422 Mixed obsessional thoughts and acts: Secondary | ICD-10-CM

## 2023-02-25 DIAGNOSIS — F1120 Opioid dependence, uncomplicated: Secondary | ICD-10-CM

## 2023-02-25 MED ORDER — BUPRENORPHINE HCL-NALOXONE HCL 2-0.5 MG SL SUBL
1.0000 | SUBLINGUAL_TABLET | Freq: Every day | SUBLINGUAL | 0 refills | Status: DC
Start: 2023-02-25 — End: 2023-04-21
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
## 2023-02-26 ENCOUNTER — Ambulatory Visit (INDEPENDENT_AMBULATORY_CARE_PROVIDER_SITE_OTHER): Payer: Medicaid Other | Admitting: Professional Counselor

## 2023-02-26 DIAGNOSIS — F422 Mixed obsessional thoughts and acts: Secondary | ICD-10-CM

## 2023-02-26 NOTE — BH Specialist Note (Signed)
BH Follow-up  MRN: [id number] NAME: Joyce [name] Date: 02/26/23  Start time: Start Time: 1000 End time: Stop Time: 1045 Total time: Total Time in Minutes (Visit): 45 Call number: Visit Number: 3- Third Visit  Reason for call today:  The patient is a 27 year old female who returned for a collaborative care follow-up. The purpose of this visit was to discuss a psychiatric consultation and develop a treatment plan. After evaluating the patient, it was determined that she would be an appropriate fit for collaborative care. We recommended she engage in weekly CBT therapy and behavior activation, as well as connect with psychiatry for medication management.   The patient agreed to these recommendations and expressed motivation for treatment. She is open to the CBT approach and eager to work on decreasing her anxiety and OCD behaviors, while also improving her activity levels and social engagement. During the session, [facility] discussed various mindfulness techniques and CBT activation strategies, and the patient was receptive to these interventions.  She presented to the session well-groomed, making good eye contact, with positive body language and a bright affect. The patient expressed that she is enjoying the collaborative care approach and feels comfortable in this setting, and she is keen to continue with it. We plan to meet weekly to work on her treatment goals and will follow up with her primary care physician and psychiatrist to ensure her needs are met. A follow-up appointment is scheduled for next week.   The patient reported that her symptoms over the past week have been relatively unchanged, continuing to experience nervousness, panic, trouble sleeping, and feelings of self-doubt. She is currently managing these symptoms with Mitrazipine, which she feels helps with her sleep, but she is open to trying other medications, a topic we will explore further with the psychiatrist.    PHQ-9  Scores:     02/26/2023   10:13 AM 02/19/2023    1:28 PM 01/06/2023    9:26 AM 11/26/2022   12:06 PM 11/26/2022   11:21 AM  Depression screen PHQ 2/9  Decreased Interest 0 0 0 0 0  Down, Depressed, Hopeless 0 0 0 1 0  PHQ - 2 Score 0 0 0 1 0  Altered sleeping 2 0 3 0 0  Tired, decreased energy 1 0 0 0 0  Change in appetite 2 1 1 2  0  Feeling bad or failure about yourself  1 0 0 1 0  Trouble concentrating 3 1 1  0 0  Moving slowly or fidgety/restless 0 0 0 0 0  Suicidal thoughts 0 0 0 0 0  PHQ-9 Score 9 2 5 4  0  Difficult doing work/chores Somewhat difficult  Somewhat difficult     GAD-7 Scores:     02/26/2023   10:14 AM 02/19/2023    1:43 PM 01/06/2023    9:27 AM 11/26/2022   12:07 PM  GAD 7 : Generalized Anxiety Score  Nervous, Anxious, on Edge 2 1 3 2   Control/stop worrying 2 3 3 3   Worry too much - different things 2 2 3 3   Trouble relaxing 2 3 3 1   Restless 2 3 3 2   Easily annoyed or irritable 1 1 3 1   Afraid - awful might happen 2 3 3 3   Total GAD 7 Score 13 16 21 15   Anxiety Difficulty Somewhat difficult Very difficult Somewhat difficult Very difficult    Stress Current stressors:  Work, parenting Sleep:  Fair Appetite:  fair Coping ability:  good Patient taking  medications as prescribed:  Yes  Current medications:  Outpatient Encounter Medications as of 02/26/2023  Medication Sig   buprenorphine-naloxone (SUBOXONE) 2-0.5 mg SUBL SL tablet Place 1 tablet under the tongue daily.   busPIRone (BUSPAR) 10 MG tablet TAKE 1 TABLET BY MOUTH TWICE A DAY   ibuprofen (ADVIL) 800 MG tablet Take 800 mg by mouth every 8 (eight) hours as needed.   olmesartan (BENICAR) 20 MG tablet Take 1 tablet (20 mg total) by mouth daily.   Facility-Administered Encounter Medications as of 02/26/2023  Medication   medroxyPROGESTERone (DEPO-PROVERA) injection 150 mg     Self-harm Behaviors Risk Assessment Self-harm risk factors:  Low Patient endorses recent thoughts of harming self:   Denies   Danger to Others Risk Assessment Danger to others risk factors:  None Patient endorses recent thoughts of harming others:  Denies   Substance Use Assessment Patient recently consumed alcohol:    Alcohol Use Disorder Identification Test (AUDIT):     07/04/2020   10:23 AM 02/19/2023    1:54 PM  Alcohol Use Disorder Test (AUDIT)  1. How often do you have a drink containing alcohol? 0 1  2. How many drinks containing alcohol do you have on a typical day when you are drinking? 0 1  3. How often do you have six or more drinks on one occasion? 0 0  AUDIT-C Score 0 2  Alcohol Brief Interventions/Follow-up Patient Refused    Patient recently used drugs:  No  Opioid Risk Assessment:    Goals, Interventions and Follow-up Plan Goals:  Decrease anxiety, ocd and depression Interventions: Mindfulness or Relaxation Training and CBT Cognitive Behavioral Therapy Follow-up Plan:  Weekly CBT therapy    Reuel Boom

## 2023-02-26 NOTE — Patient Instructions (Addendum)
Behavior activation:  Begin daily mindfulness activities ie walking, exercise, social engagements.  Begin to educate on CBT and how thoughts, emotions and behavior interact.  Decrease screen time.

## 2023-02-27 NOTE — Addendum Note (Signed)
Addended by: Reuel Boom on: 02/27/2023 10:51 AM   Modules accepted: Orders

## 2023-03-05 ENCOUNTER — Encounter: Payer: Medicaid Other | Admitting: Professional Counselor

## 2023-03-06 ENCOUNTER — Ambulatory Visit: Payer: Medicaid Other | Admitting: Professional Counselor

## 2023-03-06 DIAGNOSIS — F422 Mixed obsessional thoughts and acts: Secondary | ICD-10-CM

## 2023-03-06 NOTE — BH Specialist Note (Unsigned)
[facility] Virtual BH Telephone Follow-up  MRN: [id number] NAME: Joyce [name] Date: 03/06/23  Start time: Start Time: 0300 End time: Stop Time: 0330 Total time: Total Time in Minutes (Visit): 30 Call number: Visit Number: 4- Fourth Visit  Reason for call today:  Virtual Visit via Video Note  I connected with Joyce [name] on 03/09/23 at  3:00 PM EDT by a video enabled telemedicine application and verified that I am speaking with the correct person using two identifiers.  Location: Patient: Home Provider: Office   I discussed the limitations of evaluation and management by telemedicine and the availability of in person appointments. The patient expressed understanding and agreed to proceed.  Visit: The patient is a 27 year old female who presented for a virtual collaborative care follow-up session. She reports that her symptoms have decreased, with noticeable improvements in both anxiety and depressive symptoms. She attributes these positive changes to the techniques discussed during the last session. Specifically, she has seen progress in her daily functioning, including reduced obsessive cleaning, being more present with her children, increased confidence at work, and a less stressful home environment. The patient has been practicing mindfulness techniques and has begun to incorporate principles of Cognitive Behavioral Therapy (CBT), working on consistency in their application. Overall, she feels more positive about her treatment and believes she is benefiting from the collaborative care approach. Additionally, she reports that her current medication has been helpful in improving her sleep and mood, leading to an overall improved sense of well-being. A follow-up appointment is scheduled in one week to assess further progress.   PHQ-9 Scores:     02/26/2023   10:13 AM 02/19/2023    1:28 PM 01/06/2023    9:26 AM 11/26/2022   12:06 PM 11/26/2022   11:21 AM  Depression screen PHQ 2/9   Decreased Interest 0 0 0 0 0  Down, Depressed, Hopeless 0 0 0 1 0  PHQ - 2 Score 0 0 0 1 0  Altered sleeping 2 0 3 0 0  Tired, decreased energy 1 0 0 0 0  Change in appetite 2 1 1 2  0  Feeling bad or failure about yourself  1 0 0 1 0  Trouble concentrating 3 1 1  0 0  Moving slowly or fidgety/restless 0 0 0 0 0  Suicidal thoughts 0 0 0 0 0  PHQ-9 Score 9 2 5 4  0  Difficult doing work/chores Somewhat difficult  Somewhat difficult     GAD-7 Scores:     02/26/2023   10:14 AM 02/19/2023    1:43 PM 01/06/2023    9:27 AM 11/26/2022   12:07 PM  GAD 7 : Generalized Anxiety Score  Nervous, Anxious, on Edge 2 1 3 2   Control/stop worrying 2 3 3 3   Worry too much - different things 2 2 3 3   Trouble relaxing 2 3 3 1   Restless 2 3 3 2   Easily annoyed or irritable 1 1 3 1   Afraid - awful might happen 2 3 3 3   Total GAD 7 Score 13 16 21 15   Anxiety Difficulty Somewhat difficult Very difficult Somewhat difficult Very difficult    Stress Current stressors:   Sleep:   Appetite:   Coping ability:   Patient taking medications as prescribed:    Current medications:  Outpatient Encounter Medications as of 03/06/2023  Medication Sig   buprenorphine-naloxone (SUBOXONE) 2-0.5 mg SUBL SL tablet Place 1 tablet under the tongue daily.   busPIRone (BUSPAR) 10 MG tablet  TAKE 1 TABLET BY MOUTH TWICE A DAY   ibuprofen (ADVIL) 800 MG tablet Take 800 mg by mouth every 8 (eight) hours as needed.   olmesartan (BENICAR) 20 MG tablet Take 1 tablet (20 mg total) by mouth daily.   Facility-Administered Encounter Medications as of 03/06/2023  Medication   medroxyPROGESTERone (DEPO-PROVERA) injection 150 mg     Self-harm Behaviors Risk Assessment Self-harm risk factors:  None Patient endorses recent thoughts of harming self:  Denies   Danger to Others Risk Assessment Danger to others risk factors:  None Patient endorses recent thoughts of harming others:  Denies   Substance Use Assessment Patient recently  consumed alcohol:  No  Alcohol Use Disorder Identification Test (AUDIT):     07/04/2020   10:23 AM 02/19/2023    1:54 PM  Alcohol Use Disorder Test (AUDIT)  1. How often do you have a drink containing alcohol? 0 1  2. How many drinks containing alcohol do you have on a typical day when you are drinking? 0 1  3. How often do you have six or more drinks on one occasion? 0 0  AUDIT-C Score 0 2  Alcohol Brief Interventions/Follow-up Patient Refused    Patient recently used drugs:    Opioid Risk Assessment: Patient is on suboxone and taking as prescribed.   Goals, Interventions and Follow-up Plan Goals:  Maintain progress and continue with treatment plan Interventions: CBT Cognitive Behavioral Therapy Follow-up Plan:  Weekly CBT therapy    Reuel Boom

## 2023-03-19 ENCOUNTER — Institutional Professional Consult (permissible substitution): Payer: Medicaid Other | Admitting: Professional Counselor

## 2023-03-27 ENCOUNTER — Ambulatory Visit: Payer: Medicaid Other

## 2023-03-27 NOTE — Progress Notes (Deleted)
NURSE VISIT NOTE  Subjective:    Patient ID: Joyce [name], female    DOB: Dec 06, 1995, 27 y.o.   MRN: [id number]  HPI  Patient is a 27 y.o. [id number] female who presents for depo provera injection.   Objective:    There were no vitals taken for this visit.  Last Annual: 11/04/2022. Last pap: 06/11/2021. Last Depo-Provera: 12/26/2022. Side Effects if any: none. Serum HCG indicated? No . Depo-Provera 150 mg IM given by: Sheliah Hatch, CMA. Site: Right Ventrogluteal  Lab Review    Assessment:   No diagnosis found.   Plan:   Next appointment due between 06/12/23 and 06/26/23.    Fonda Kinder, CMA

## 2023-03-30 ENCOUNTER — Ambulatory Visit (INDEPENDENT_AMBULATORY_CARE_PROVIDER_SITE_OTHER): Payer: Medicaid Other

## 2023-03-30 VITALS — BP 133/97 | HR 69 | Ht 69.0 in | Wt 134.4 lb

## 2023-03-30 DIAGNOSIS — Z3202 Encounter for pregnancy test, result negative: Secondary | ICD-10-CM

## 2023-03-30 DIAGNOSIS — Z3042 Encounter for surveillance of injectable contraceptive: Secondary | ICD-10-CM | POA: Diagnosis not present

## 2023-03-30 DIAGNOSIS — Z3200 Encounter for pregnancy test, result unknown: Secondary | ICD-10-CM

## 2023-03-30 LAB — POCT URINE PREGNANCY: Preg Test, Ur: NEGATIVE

## 2023-03-30 MED ORDER — MEDROXYPROGESTERONE ACETATE 150 MG/ML IM SUSP
150.0000 mg | Freq: Once | INTRAMUSCULAR | Status: AC
Start: 2023-03-30 — End: 2023-03-30
  Administered 2023-03-30: 150 mg via INTRAMUSCULAR

## 2023-03-30 NOTE — Progress Notes (Signed)
NURSE VISIT NOTE  Subjective:    Patient ID: Joyce [name], female    DOB: 04-24-1996, 27 y.o.   MRN: [id number]  HPI  Patient is a 27 y.o. [id number] female who presents for depo provera injection.   Objective:    BP (!) 133/97   Pulse 69   Ht 5\' 9"  (1.753 m)   Wt 134 lb 6.4 oz (61 kg)   BMI 19.85 kg/m   Last Annual: 11/04/22. Last pap: 06/11/21. Last Depo-Provera: 12/26/22. Side Effects if any: n/a. Serum HCG indicated? Yes,negative. Depo-Provera 150 mg IM given by: Georgiana Shore, CMA. Site: Right Upper Outer [place]andrant    Assessment:   1. Encounter for Depo-Provera contraception   2. Possible pregnancy, not yet confirmed      Plan:   Next appointment due between 06/15/23 and 06/29/23.    Loman Chroman, CMA

## 2023-04-03 ENCOUNTER — Other Ambulatory Visit: Payer: Self-pay | Admitting: Family Medicine

## 2023-04-03 DIAGNOSIS — F1120 Opioid dependence, uncomplicated: Secondary | ICD-10-CM

## 2023-04-08 ENCOUNTER — Telehealth: Payer: Self-pay | Admitting: Family Medicine

## 2023-04-08 ENCOUNTER — Other Ambulatory Visit: Payer: Self-pay | Admitting: Family Medicine

## 2023-04-08 DIAGNOSIS — F1120 Opioid dependence, uncomplicated: Secondary | ICD-10-CM

## 2023-04-08 NOTE — Telephone Encounter (Signed)
Patient is scheduled 10/23 for buprenorphine-naloxone (SUBOXONE) 2-0.5 mg SUBL SL tablet [[id number]] refill- wanting to know if she can get a refill to hold her off until appt. Please advise, thank you

## 2023-04-08 NOTE — Telephone Encounter (Signed)
Please encourage the patient to return to the clinic for a urine drug screening before refilling her rx . The orders have been placed. Additionally, please follow up on the referral that was made to pain management on 12/22/2022.

## 2023-04-09 ENCOUNTER — Ambulatory Visit (INDEPENDENT_AMBULATORY_CARE_PROVIDER_SITE_OTHER): Payer: Medicaid Other | Admitting: Professional Counselor

## 2023-04-09 DIAGNOSIS — F411 Generalized anxiety disorder: Secondary | ICD-10-CM | POA: Diagnosis not present

## 2023-04-09 NOTE — Telephone Encounter (Signed)
Pt states she is not needing pain medicine she needs suboxene , states she never got a call from pain management states she doesn't need it would like to continue getting this filled by her pcp.  Will try to do uds at a [facility] in [place] since that is where she lives otherwise she doesn't know when she would be able to make it this way until her appt date.

## 2023-04-09 NOTE — BH Specialist Note (Signed)
[facility] Virtual BH Telephone Follow-up  MRN: [id number] NAME: Joyce [name] Date: 04/09/23  Start time: Start Time: 0929 End time: Stop Time: 1003 Total time: Total Time in Minutes (Visit): 34 Call number:    Reason for call today:  The patient is a 27 year old female who presented for a follow-up in collaborative care. It has been about a month since her last visit. She appeared frustrated due to ongoing difficulties in getting her Suboxone refilled. The protocol requires her to take a drug screen, which has been challenging for her to complete because she lives far away, is working, and is a single mother. She expressed feeling judged for being on Suboxone and described feeling as though she's being labeled as "med-seeking," despite diligently following her medication regimen and actively working to wean herself off Suboxone. She does not want to be dependent on the medication, but is cautious about tapering off too quickly due to concerns about withdrawal and the potential risk of relapse.  Despite these frustrations, the patient feels positive about her plan and is committed to staying on course. She is managing her mood well, working, and taking care of her daughter, though she continues to experience anxiety and depressive symptoms from time to time. Overall, she remains focused on staying positive and maintaining her progress. She agreed to complete the drug screen to obtain her medication and will follow up in two weeks to monitor her progress.  PHQ-9 Scores:     04/09/2023    2:57 PM 02/26/2023   10:13 AM 02/19/2023    1:28 PM 01/06/2023    9:26 AM 11/26/2022   12:06 PM  Depression screen PHQ 2/9  Decreased Interest 1 0 0 0 0  Down, Depressed, Hopeless 0 0 0 0 1  PHQ - 2 Score 1 0 0 0 1  Altered sleeping 2 2 0 3 0  Tired, decreased energy 1 1 0 0 0  Change in appetite 2 2 1 1 2   Feeling bad or failure about yourself  1 1 0 0 1  Trouble concentrating  3 1 1  0  Moving slowly or  fidgety/restless 0 0 0 0 0  Suicidal thoughts 0 0 0 0 0  PHQ-9 Score 7 9 2 5 4   Difficult doing work/chores  Somewhat difficult  Somewhat difficult    GAD-7 Scores:     04/09/2023    2:57 PM 02/26/2023   10:14 AM 02/19/2023    1:43 PM 01/06/2023    9:27 AM  GAD 7 : Generalized Anxiety Score  Nervous, Anxious, on Edge 1 2 1 3   Control/stop worrying 1 2 3 3   Worry too much - different things 1 2 2 3   Trouble relaxing 1 2 3 3   Restless 1 2 3 3   Easily annoyed or irritable 1 1 1 3   Afraid - awful might happen 2 2 3 3   Total GAD 7 Score 8 13 16 21   Anxiety Difficulty Somewhat difficult Somewhat difficult Very difficult Somewhat difficult    Stress Current stressors:  work, medication, parenting Sleep:  Fair Appetite:  Fair Coping ability:  Good Patient taking medications as prescribed:  Yes  Current medications:  Outpatient Encounter Medications as of 04/09/2023  Medication Sig   buprenorphine-naloxone (SUBOXONE) 2-0.5 mg SUBL SL tablet Place 1 tablet under the tongue daily.   busPIRone (BUSPAR) 10 MG tablet TAKE 1 TABLET BY MOUTH TWICE A DAY   ibuprofen (ADVIL) 800 MG tablet Take 800 mg by mouth every  8 (eight) hours as needed.   olmesartan (BENICAR) 20 MG tablet Take 1 tablet (20 mg total) by mouth daily.   Facility-Administered Encounter Medications as of 04/09/2023  Medication   medroxyPROGESTERone (DEPO-PROVERA) injection 150 mg     Self-harm Behaviors Risk Assessment Self-harm risk factors:  Depression, substance use history Patient endorses recent thoughts of harming self:  Denies current thoughts, plan or intent.   Danger to Others Risk Assessment Danger to others risk factors:  None Patient endorses recent thoughts of harming others:  Denies  Patient recently used drugs:  No  Opioid Risk Assessment: Withdrawal risk currently taking suboxone   Goals, Interventions and Follow-up Plan Goals:  Maintain improved mood and taper off suboxone Interventions:  Behavioral Activation and CBT Cognitive Behavioral Therapy Follow-up Plan:  Bi-weekly follow up!   Reuel Boom

## 2023-04-10 NOTE — Telephone Encounter (Signed)
Patient is returning a phone call.  

## 2023-04-10 NOTE — Telephone Encounter (Signed)
Once the urine drug screen (UDS) is completed with negative results, I will proceed to refill the medications.

## 2023-04-21 ENCOUNTER — Other Ambulatory Visit: Payer: Self-pay | Admitting: Family Medicine

## 2023-04-21 DIAGNOSIS — F1120 Opioid dependence, uncomplicated: Secondary | ICD-10-CM

## 2023-04-21 LAB — TOXASSURE SELECT 13 (MW), URINE

## 2023-04-21 MED ORDER — BUPRENORPHINE HCL-NALOXONE HCL 2-0.5 MG SL SUBL: 1.0000 | SUBLINGUAL_TABLET | Freq: Every day | SUBLINGUAL | 0 refills | Status: DC

## 2023-04-22 ENCOUNTER — Encounter: Payer: Self-pay | Admitting: Family Medicine

## 2023-04-22 ENCOUNTER — Ambulatory Visit: Payer: Medicaid Other | Admitting: Family Medicine

## 2023-04-22 VITALS — BP 134/84 | HR 76 | Wt 136.0 lb

## 2023-04-22 DIAGNOSIS — F1120 Opioid dependence, uncomplicated: Secondary | ICD-10-CM | POA: Diagnosis not present

## 2023-04-22 NOTE — Assessment & Plan Note (Addendum)
The patient is doing well and agrees to complete a urine drug screening monthly while on treatment. It was discussed with the patient that [facility] was found in her urine sample, and she was advised to quit using it. The patient was informed that I would not be able to refill her Suboxone if her next urine screen shows THC in her system. She verbalized understanding and reports that she is in the process of quitting.

## 2023-04-22 NOTE — Progress Notes (Addendum)
Established Patient Office Visit  Subjective:  Patient ID: Joyce [name], female    DOB: Mar 04, 1996  Age: 27 y.o. MRN: [id number]  CC:  Chief Complaint  Patient presents with   Care Management    Following up.    HPI Joyce [name] is a 27 y.o. female with past medical history of  opioid use disorder presents for f/u. For the details of today's visit, please refer to the assessment and plan.     Past Medical History:  Diagnosis Date   Allergy    seasonal   Anxiety    Elevated blood-pressure reading without diagnosis of hypertension 12/26/2014   Elevated serum creatinine 12/26/2014   Gestational hypertension 02/12/2015   H/O acute renal failure 12/26/2014   Heart murmur    was told that she had beginning stages   Migraine    Post-viral cough syndrome 06/20/2019   Renal dysfunction    Renal dysfunction 12/26/2014   S/P thyroid surgery 12/26/2014   Severe preeclampsia    Short interval between pregnancies complicating pregnancy in third trimester, antepartum    Substance abuse (HCC)    uses marijuanna   Tension headache 11/11/2018   Thyroid disease    right side removed   Underweight 12/26/2014    Past Surgical History:  Procedure Laterality Date   THYROID SURGERY      Family History  Problem Relation Age of Onset   Hypertension Mother    Birth defects Paternal Grandmother    Hypertension Maternal Grandmother     Social History   Socioeconomic History   Marital status: Single    Spouse name: Not on file   Number of children: 2   Years of education: Not on file   Highest education level: High school graduate  Occupational History   Not on file  Tobacco Use   Smoking status: Former    Types: Cigarettes   Smokeless tobacco: Never  Vaping Use   Vaping status: Every Day  Substance and Sexual Activity   Alcohol use: Yes    Comment: occasion   Drug use: Yes    Frequency: 1.0 times per week    Types: Marijuana   Sexual activity: Yes    Birth  control/protection: Injection  Other Topics Concern   Not on file  Social History Narrative   Lives with two kids   Auberi: 5 (full time)    Jaxon: 4 (half and half) [place]    Fathers are in the lives, help take care of children      Enjoys: playing with kids, going out to eat, movies and music      Diet: Does not feel like she eats as good as she should. Does eat veggies and fruits; will drink v8   Caffeine: pepis -4-5 cans a day, has slowed down    Water: gallon daily      Wear seat belt   Does not wear sunscreen   Smoke and carbon monoxide detectors   Does not use phone while driving, unless it is a call.    Social Determinants of Health   Financial Resource Strain: Low Risk  (07/04/2020)   Overall Financial Resource Strain (CARDIA)    Difficulty of Paying Living Expenses: Not very hard  Food Insecurity: No Food Insecurity (07/04/2020)   Hunger Vital Sign    Worried About Running Out of Food in the Last Year: Never true    Ran Out of Food in the Last Year: Never true  Transportation  Needs: No Transportation Needs (07/04/2020)   PRAPARE - Administrator, Civil Service (Medical): No    Lack of Transportation (Non-Medical): No  Physical Activity: Sufficiently Active (07/04/2020)   Exercise Vital Sign    Days of Exercise per Week: 5 days    Minutes of Exercise per Session: 30 min  Stress: No Stress Concern Present (07/04/2020)   [employer] of Occupational Health - Occupational Stress Questionnaire    Feeling of Stress : Not at all  Social Connections: Socially Isolated (07/04/2020)   Social Connection and Isolation Panel [NHANES]    Frequency of Communication with Friends and Family: More than three times a week    Frequency of Social Gatherings with Friends and Family: Twice a week    Attends Religious Services: Never    Database administrator or Organizations: No    Attends Banker Meetings: Never    Marital Status: Never married  Intimate Partner  Violence: Not At Risk (07/04/2020)   Humiliation, Afraid, Rape, and Kick questionnaire    Fear of Current or Ex-Partner: No    Emotionally Abused: No    Physically Abused: No    Sexually Abused: No    Outpatient Medications Prior to Visit  Medication Sig Dispense Refill   buprenorphine-naloxone (SUBOXONE) 2-0.5 mg SUBL SL tablet Place 1 tablet under the tongue daily. 30 tablet 0   busPIRone (BUSPAR) 10 MG tablet TAKE 1 TABLET BY MOUTH TWICE A DAY 180 tablet 1   ibuprofen (ADVIL) 800 MG tablet Take 800 mg by mouth every 8 (eight) hours as needed.     olmesartan (BENICAR) 20 MG tablet Take 1 tablet (20 mg total) by mouth daily. 90 tablet 0   Facility-Administered Medications Prior to Visit  Medication Dose Route Frequency Provider Last Rate Last Admin   medroxyPROGESTERone (DEPO-PROVERA) injection 150 mg  150 mg Intramuscular Once Tresea Mall, CNM        No Known Allergies  ROS Review of Systems  Constitutional:  Negative for chills and fever.  Eyes:  Negative for visual disturbance.  Respiratory:  Negative for chest tightness and shortness of breath.   Neurological:  Negative for dizziness and headaches.      Objective:    Physical Exam HENT:     Head: Normocephalic.     Mouth/Throat:     Mouth: Mucous membranes are moist.  Cardiovascular:     Rate and Rhythm: Normal rate.     Heart sounds: Normal heart sounds.  Pulmonary:     Effort: Pulmonary effort is normal.     Breath sounds: Normal breath sounds.  Neurological:     Mental Status: She is alert.     BP 134/84   Pulse 76   Wt 136 lb 0.6 oz (61.7 kg)   SpO2 98%   BMI 20.09 kg/m  Wt Readings from Last 3 Encounters:  04/22/23 136 lb 0.6 oz (61.7 kg)  03/30/23 134 lb 6.4 oz (61 kg)  01/06/23 131 lb 0.6 oz (59.4 kg)    Lab Results  Component Value Date   TSH 0.456 11/01/2021   Lab Results  Component Value Date   WBC 9.8 05/11/2022   HGB 13.2 05/11/2022   HCT 40.5 05/11/2022   MCV 84.9 05/11/2022    PLT 207 05/11/2022   Lab Results  Component Value Date   NA 140 10/06/2022   K 3.6 10/06/2022   CO2 22 10/06/2022   GLUCOSE 88 10/06/2022   BUN 19 10/06/2022  CREATININE 1.40 (H) 10/06/2022   BILITOT 0.5 11/01/2021   ALKPHOS 122 (H) 11/01/2021   AST 17 11/01/2021   ALT 12 11/01/2021   PROT 6.4 11/01/2021   ALBUMIN 4.4 11/01/2021   CALCIUM 9.9 10/06/2022   ANIONGAP 6 05/11/2022   EGFR 53 (L) 10/06/2022   Lab Results  Component Value Date   CHOL 111 11/01/2021   Lab Results  Component Value Date   HDL 53 11/01/2021   Lab Results  Component Value Date   LDLCALC 47 11/01/2021   Lab Results  Component Value Date   TRIG 40 11/01/2021   Lab Results  Component Value Date   CHOLHDL 2.1 11/01/2021   Lab Results  Component Value Date   HGBA1C 5.3 11/01/2021      Assessment & Plan:  Opioid use disorder, moderate, dependence (HCC) Assessment & Plan: The patient is doing well and agrees to complete a urine drug screening monthly while on treatment. It was discussed with the patient that [facility] was found in her urine sample, and she was advised to quit using it. The patient was informed that I would not be able to refill her Suboxone if her next urine screen shows THC in her system. She verbalized understanding and reports that she is in the process of quitting.     Note: This chart has been completed using Engineer, civil (consulting) software, and while attempts have been made to ensure accuracy, certain words and phrases may not be transcribed as intended.   Follow-up: Return in about 4 months (around 08/23/2023).   Gilmore Laroche, FNP

## 2023-04-22 NOTE — Patient Instructions (Signed)
I appreciate the opportunity to provide care to you today!    Follow up:  4 months   Attached with your AVS, you will find valuable resources for self-education. I highly recommend dedicating some time to thoroughly examine them.   Please continue to a heart-healthy diet and increase your physical activities. Try to exercise for at least five days a week.    It was a pleasure to see you and I look forward to continuing to work together on your health and well-being. Please do not hesitate to call the office if you need care or have questions about your care.  In case of emergency, please visit the Emergency Department for urgent care, or contact our clinic at [phone] to schedule an appointment. We're here to help you!   Have a wonderful day and week. With Gratitude, Gilmore Laroche MSN, FNP-BC
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
## 2023-05-14 ENCOUNTER — Encounter: Payer: Self-pay | Admitting: Psychiatry

## 2023-05-14 ENCOUNTER — Ambulatory Visit (INDEPENDENT_AMBULATORY_CARE_PROVIDER_SITE_OTHER): Payer: Medicaid Other | Admitting: Psychiatry

## 2023-05-14 VITALS — BP 127/90 | HR 74 | Temp 98.1°F | Ht 69.0 in | Wt 137.4 lb

## 2023-05-14 DIAGNOSIS — F4310 Post-traumatic stress disorder, unspecified: Secondary | ICD-10-CM

## 2023-05-14 DIAGNOSIS — F331 Major depressive disorder, recurrent, moderate: Secondary | ICD-10-CM | POA: Diagnosis not present

## 2023-05-14 DIAGNOSIS — F419 Anxiety disorder, unspecified: Secondary | ICD-10-CM | POA: Diagnosis not present

## 2023-05-14 MED ORDER — HYDROXYZINE HCL 25 MG PO TABS: 25.0000 mg | ORAL_TABLET | Freq: Every day | ORAL | 1 refills | Status: DC | PRN

## 2023-05-14 MED ORDER — SERTRALINE HCL 50 MG PO TABS: ORAL_TABLET | ORAL | 1 refills | Status: DC

## 2023-05-14 NOTE — Patient Instructions (Signed)
Start sertraline 25 mg at night for one week, then 50 mg at night  Start hydroxyzine 25 mg daily as needed for anxiety  Obtain lab (TSH) Referral to therapy  Next appointment: 1/14 at 8 AM

## 2023-06-10 ENCOUNTER — Other Ambulatory Visit: Payer: Self-pay | Admitting: Psychiatry

## 2023-06-10 ENCOUNTER — Other Ambulatory Visit: Payer: Self-pay | Admitting: Family Medicine

## 2023-06-10 DIAGNOSIS — F1120 Opioid dependence, uncomplicated: Secondary | ICD-10-CM

## 2023-06-11 ENCOUNTER — Other Ambulatory Visit: Payer: Self-pay | Admitting: Family Medicine

## 2023-06-11 DIAGNOSIS — F1120 Opioid dependence, uncomplicated: Secondary | ICD-10-CM

## 2023-06-11 NOTE — Telephone Encounter (Signed)
Please encourage the patient to return for a urine drug screening before the medication can be refilled.  Orders have been placed

## 2023-06-12 ENCOUNTER — Other Ambulatory Visit: Payer: Self-pay | Admitting: Family Medicine

## 2023-06-12 DIAGNOSIS — F1120 Opioid dependence, uncomplicated: Secondary | ICD-10-CM

## 2023-06-15 NOTE — Telephone Encounter (Signed)
Copied from CRM [id number]. Topic: Clinical - Medical Advice >> Jun 15, 2023  8:13 AM Almira Coaster wrote: Reason for CRM: Patient is calling to follow up on a drug screening request patient submitted to Gilmore Laroche in order to get a medication refill for buprenorphine-naloxone (SUBOXONE) 2-0.5 mg SUBL SL tablet.

## 2023-06-16 ENCOUNTER — Telehealth: Payer: Self-pay

## 2023-06-16 NOTE — Telephone Encounter (Signed)
A urine drug screen order has been placed. Please advise the patient to complete the urine drug screening before the refill can be sent in.

## 2023-06-16 NOTE — Telephone Encounter (Signed)
Copied from CRM [id number]. Topic: Clinical - Prescription Issue >> Jun 16, 2023 12:19 PM Chantha C wrote: Reason for CRM:  Pt will be out of buprenorphine-naloxone (SUBOXONE) 2-0.5 mg SUBL SL tablet in 2 days. Pt called yesterday regarding needing a drug screening in order to get a medication refill. Pt is worried, pls advise and c/b [phone].  [id number] -  [address] [place] [address] [phone]:[phone]

## 2023-06-17 ENCOUNTER — Other Ambulatory Visit: Payer: Self-pay | Admitting: Family Medicine

## 2023-06-17 DIAGNOSIS — F1120 Opioid dependence, uncomplicated: Secondary | ICD-10-CM

## 2023-06-17 NOTE — Telephone Encounter (Signed)
Pt has been informed , states she will go to a [facility] in [place] either today or tomorrow.

## 2023-06-19 NOTE — Progress Notes (Deleted)
    NURSE VISIT NOTE  Subjective:    Patient ID: LAMARI PILKENTON, female    DOB: [birth year], 27 y.o.   MRN: [id number]  HPI  Patient is a 27 y.o. [id number] female who presents for depo provera injection.   Objective:    There were no vitals taken for this visit.  Last Annual: 11/04/22. Last pap: 06/11/21. Last Depo-Provera: 03/30/23. Side Effects if any: {NONE:21772}***. Serum HCG indicated? {YES/NO:21197}. Depo-Provera 150 mg IM given by: {AOB Clinical VFIEP:32951}. Site: {AOB INJ [id number]  Lab Review  @THIS  VISIT ONLY@  Assessment:   No diagnosis found.   Plan:   Next appointment due between 09/07/23 and 10/05/23.    Cornelius Moras, CMA

## 2023-06-22 ENCOUNTER — Ambulatory Visit: Payer: Medicaid Other

## 2023-06-22 DIAGNOSIS — Z3042 Encounter for surveillance of injectable contraceptive: Secondary | ICD-10-CM

## 2023-06-29 ENCOUNTER — Ambulatory Visit (INDEPENDENT_AMBULATORY_CARE_PROVIDER_SITE_OTHER): Payer: Medicaid Other | Admitting: Professional Counselor

## 2023-06-29 DIAGNOSIS — Z91199 Patient's noncompliance with other medical treatment and regimen due to unspecified reason: Secondary | ICD-10-CM

## 2023-06-29 NOTE — Progress Notes (Signed)
Patient no-showed today's appointment; appointment was for 06/29/2023 at 8 AM to establish care in outpatient therapy.

## 2023-06-30 ENCOUNTER — Ambulatory Visit (INDEPENDENT_AMBULATORY_CARE_PROVIDER_SITE_OTHER): Payer: Medicaid Other

## 2023-06-30 VITALS — BP 118/66 | Ht 69.0 in | Wt 139.0 lb

## 2023-06-30 DIAGNOSIS — Z3042 Encounter for surveillance of injectable contraceptive: Secondary | ICD-10-CM | POA: Diagnosis not present

## 2023-06-30 MED ORDER — MEDROXYPROGESTERONE ACETATE 150 MG/ML IM SUSY
150.0000 mg | PREFILLED_SYRINGE | Freq: Once | INTRAMUSCULAR | Status: AC
  Administered 2023-06-30: 150 mg via INTRAMUSCULAR

## 2023-06-30 NOTE — Patient Instructions (Signed)

## 2023-06-30 NOTE — Progress Notes (Signed)
    NURSE VISIT NOTE  Subjective:    Patient ID: Joyce [name], female    DOB: 01/09/1996, 27 y.o.   MRN: [id number]  HPI  Patient is a 27 y.o. [id number] female who presents for depo provera  injection. Patient has not been sexually active in over a year so no pregnancy test performed since she is one day late for her depo.   Objective:    BP 118/66   Ht 5' 9 (1.753 m)   Wt 139 lb (63 kg)   BMI 20.53 kg/m   Last Annual: 11/04/22. Last pap: 06/11/21. Last Depo-Provera : 03/30/23. Side Effects if any: none. Serum HCG indicated? No . Depo-Provera  150 mg IM given by: Beola Skeens, CMA. Site: Left [place]    Assessment:   1. Encounter for surveillance of injectable contraceptive      Plan:   Next appointment due between March 18 and April 1.    Beola Skeens, CMA

## 2023-07-03 ENCOUNTER — Other Ambulatory Visit: Payer: Self-pay | Admitting: Family Medicine

## 2023-07-03 ENCOUNTER — Encounter: Payer: Self-pay | Admitting: Family Medicine

## 2023-07-03 DIAGNOSIS — F1120 Opioid dependence, uncomplicated: Secondary | ICD-10-CM

## 2023-07-03 LAB — TOXASSURE SELECT 13 (MW), URINE

## 2023-07-11 NOTE — Progress Notes (Signed)
 No show

## 2023-07-14 ENCOUNTER — Ambulatory Visit (INDEPENDENT_AMBULATORY_CARE_PROVIDER_SITE_OTHER): Payer: Medicaid Other | Admitting: Psychiatry

## 2023-07-14 DIAGNOSIS — Z91199 Patient's noncompliance with other medical treatment and regimen due to unspecified reason: Secondary | ICD-10-CM

## 2023-07-17 ENCOUNTER — Telehealth: Payer: Self-pay | Admitting: Family Medicine

## 2023-07-17 NOTE — Telephone Encounter (Signed)
LVM for pt to call back in regards to continuing services with Arlys John.

## 2023-08-10 ENCOUNTER — Encounter: Payer: Self-pay | Admitting: Family Medicine

## 2023-08-15 LAB — TOXASSURE SELECT 13 (MW), URINE

## 2023-08-16 ENCOUNTER — Encounter: Payer: Self-pay | Admitting: Family Medicine

## 2023-08-16 ENCOUNTER — Other Ambulatory Visit: Payer: Self-pay | Admitting: Family Medicine

## 2023-08-16 DIAGNOSIS — F1120 Opioid dependence, uncomplicated: Secondary | ICD-10-CM

## 2023-08-16 MED ORDER — BUPRENORPHINE HCL-NALOXONE HCL 2-0.5 MG SL SUBL: 1.0000 | SUBLINGUAL_TABLET | Freq: Every day | SUBLINGUAL | 0 refills | Status: DC

## 2023-08-24 ENCOUNTER — Ambulatory Visit: Payer: Medicaid Other | Admitting: Family Medicine

## 2023-08-28 ENCOUNTER — Encounter: Payer: Self-pay | Admitting: Family Medicine

## 2023-09-22 ENCOUNTER — Ambulatory Visit: Payer: Medicaid Other

## 2023-09-24 ENCOUNTER — Ambulatory Visit

## 2023-09-24 NOTE — Progress Notes (Deleted)
    NURSE VISIT NOTE  Subjective:    Patient ID: Joyce [name], female    DOB: [birth year], 28 y.o.   MRN: [id number]  HPI  Patient is a 28 y.o. [id number] female who presents for depo provera injection.   Objective:    There were no vitals taken for this visit.  Last Annual: 11/04/22. Last pap: 06/11/21. Last Depo-Provera: 06/30/23. Side Effects if any: none. Serum HCG indicated? No . Depo-Provera 150 mg IM given by: Beverely Pace, CMA. Site: {AOB INJ [id number]   Assessment:   No diagnosis found.   Plan:   Next appointment due between *** and ***.    Loney Laurence, CMA

## 2023-09-28 ENCOUNTER — Ambulatory Visit (INDEPENDENT_AMBULATORY_CARE_PROVIDER_SITE_OTHER)

## 2023-09-28 ENCOUNTER — Encounter: Payer: Self-pay | Admitting: Obstetrics and Gynecology

## 2023-09-28 VITALS — BP 146/94 | HR 121 | Resp 16 | Ht 70.0 in | Wt 134.5 lb

## 2023-09-28 DIAGNOSIS — Z3042 Encounter for surveillance of injectable contraceptive: Secondary | ICD-10-CM | POA: Diagnosis not present

## 2023-09-28 MED ORDER — MEDROXYPROGESTERONE ACETATE 150 MG/ML IM SUSP
150.0000 mg | Freq: Once | INTRAMUSCULAR | Status: AC
  Administered 2023-09-28: 150 mg via INTRAMUSCULAR

## 2023-09-28 NOTE — Progress Notes (Signed)
    NURSE VISIT NOTE  Subjective:    Patient ID: LUNETTA MARINA, female    DOB: 1995-08-23, 28 y.o.   MRN: [id number]  HPI  Patient is a 28 y.o. [id number] female who presents for depo provera injection. Patient had elevated bp. Advised her to watch bp and contact PCP for follow up.   Objective:    BP (!) 146/94   Pulse (!) 121   Resp 16   Ht 5\' 10"  (1.778 m)   Wt 134 lb 8 oz (61 kg)   BMI 19.30 kg/m   Last Annual: 11/04/22. Last pap: 06/11/21. Last Depo-Provera: 06/30/23. Side Effects if any: none. Serum HCG indicated? No . Depo-Provera 150 mg IM given by: Santiago Bumpers, CMA. Site: Right [place]   Assessment:   1. Encounter for surveillance of injectable contraceptive      Plan:   Next appointment due between June 16 and June 30.    Santiago Bumpers, CMA [facility]

## 2023-09-28 NOTE — Patient Instructions (Signed)

## 2023-10-02 ENCOUNTER — Ambulatory Visit (INDEPENDENT_AMBULATORY_CARE_PROVIDER_SITE_OTHER)

## 2023-10-02 VITALS — BP 130/87 | HR 85 | Ht 71.0 in | Wt 134.0 lb

## 2023-10-02 DIAGNOSIS — R350 Frequency of micturition: Secondary | ICD-10-CM

## 2023-10-02 LAB — POCT URINALYSIS DIPSTICK
Bilirubin, UA: NEGATIVE
Blood, UA: NEGATIVE
Glucose, UA: NEGATIVE
Ketones, UA: NEGATIVE
Leukocytes, UA: NEGATIVE
Nitrite, UA: NEGATIVE
Protein, UA: POSITIVE — AB
Spec Grav, UA: 1.02 (ref 1.010–1.025)
Urobilinogen, UA: 0.2 U/dL
pH, UA: 7 (ref 5.0–8.0)

## 2023-10-02 NOTE — Progress Notes (Signed)
    NURSE VISIT NOTE  Subjective:    Patient ID: Joyce [name], female    DOB: 1996/01/27, 28 y.o.   MRN: [id number]       HPI  Patient is a 28 y.o. [id number] female who presents for urinary urgency and frequency painful urination    for 2 days.  Patient does not have a history of recurrent UTI.  Patient does not have a history of pyelonephritis. She did take a Azo yesterday morning.    Objective:    BP 130/87   Pulse 85   Ht 5\' 11"  (1.803 m)   Wt 134 lb (60.8 kg)   BMI 18.69 kg/m    Lab Review  Results for orders placed or performed in visit on 10/02/23  POCT Urinalysis Dipstick  Result Value Ref Range   Color, UA     Clarity, UA     Glucose, UA Negative Negative   Bilirubin, UA Negative    Ketones, UA Negative    Spec Grav, UA 1.020 1.010 - 1.025   Blood, UA Negative    pH, UA 7.0 5.0 - 8.0   Protein, UA Positive (A) Negative   Urobilinogen, UA 0.2 0.2 or 1.0 E.U./dL   Nitrite, UA Negative    Leukocytes, UA Negative Negative   Appearance     Odor      Assessment:   1. Frequency of urination      Plan:   Urine Culture Sent. Maintain adequate hydration.  May use AZO OTC prn.  Follow up if symptoms worsen or fail to improve as anticipated, and as needed.    Loney Laurence, CMA

## 2023-10-04 LAB — URINE CULTURE

## 2023-10-05 ENCOUNTER — Encounter: Payer: Self-pay | Admitting: Certified Nurse Midwife

## 2023-10-15 ENCOUNTER — Ambulatory Visit: Payer: Self-pay | Admitting: Family Medicine

## 2023-10-15 DIAGNOSIS — I10 Essential (primary) hypertension: Secondary | ICD-10-CM

## 2023-10-15 DIAGNOSIS — F1120 Opioid dependence, uncomplicated: Secondary | ICD-10-CM

## 2023-10-20 ENCOUNTER — Encounter: Payer: Self-pay | Admitting: Family Medicine

## 2023-12-04 ENCOUNTER — Ambulatory Visit: Payer: Self-pay | Admitting: Family Medicine

## 2023-12-06 ENCOUNTER — Ambulatory Visit
Admission: EM | Admit: 2023-12-06 | Discharge: 2023-12-06 | Disposition: A | Discharge status: Home / Self Care | Attending: Emergency Medicine | Admitting: Emergency Medicine

## 2023-12-06 DIAGNOSIS — S80862A Insect bite (nonvenomous), left lower leg, initial encounter: Secondary | ICD-10-CM

## 2023-12-06 DIAGNOSIS — W57XXXA Bitten or stung by nonvenomous insect and other nonvenomous arthropods, initial encounter: Secondary | ICD-10-CM

## 2023-12-06 DIAGNOSIS — S81832A Puncture wound without foreign body, left lower leg, initial encounter: Secondary | ICD-10-CM | POA: Diagnosis not present

## 2023-12-06 MED ORDER — DOXYCYCLINE HYCLATE 100 MG PO CAPS: 100.0000 mg | ORAL_CAPSULE | Freq: Two times a day (BID) | ORAL | 0 refills | Status: DC

## 2023-12-06 MED ORDER — PREDNISONE 10 MG (21) PO TBPK: ORAL_TABLET | Freq: Every day | ORAL | 0 refills | Status: DC

## 2023-12-06 NOTE — Discharge Instructions (Signed)
 Your evaluated for a possible insect bite due to the warmth to the skin, swelling and discoloration will provide coverage for bacteria as well as localized reaction  Begin doxycycline twice daily for 7 days  Begin prednisone every morning with food to reduce inflammation and help with pain  Cleanse daily with soap and water, pat and do not rub, may leave open to air  Itching May use allergy medicine, may also use topical Benadryl  cream, calamine lotion or similar products  If no improvement seen with use of medicine please follow-up for reevaluation

## 2023-12-06 NOTE — ED Triage Notes (Addendum)
 Pt presents to UC for c/o rash behind right knee x3 days. Pt concerned for insect bite but unsure what bit her. Feeling fatigued and decreased appetite. Used alcohol, benadryl  cream w/o relief.

## 2023-12-06 NOTE — ED Notes (Signed)
Pt states she did not take her BP meds this morning. ?
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
## 2023-12-09 ENCOUNTER — Encounter: Payer: Self-pay | Admitting: Family Medicine

## 2023-12-21 ENCOUNTER — Ambulatory Visit

## 2023-12-21 DIAGNOSIS — Z3042 Encounter for surveillance of injectable contraceptive: Secondary | ICD-10-CM

## 2023-12-22 ENCOUNTER — Ambulatory Visit

## 2023-12-22 VITALS — BP 126/99 | HR 62 | Wt 129.9 lb

## 2023-12-22 DIAGNOSIS — Z3042 Encounter for surveillance of injectable contraceptive: Secondary | ICD-10-CM

## 2023-12-22 MED ORDER — MEDROXYPROGESTERONE ACETATE 150 MG/ML IM SUSP
150.0000 mg | Freq: Once | INTRAMUSCULAR | Status: AC
  Administered 2023-12-22: 150 mg via INTRAMUSCULAR

## 2023-12-22 NOTE — Progress Notes (Signed)
    NURSE VISIT NOTE  Subjective:    Patient ID: Joyce [name], female    DOB: 07/22/1995, 28 y.o.   MRN: [id number]  HPI  Patient is a 28 y.o. [id number] female who presents for depo provera  injection.   Objective:    BP (!) 126/99   Pulse 62   Wt 129 lb 14.4 oz (58.9 kg)   BMI 18.12 kg/m   Last Annual: 11/04/22. Last pap: 06/11/21. Last Depo-Provera : 09/28/23. Side Effects if any: none. Serum HCG indicated? No . Depo-Provera  150 mg IM given by: Mathis Getting, CMA. Site: Left Upper Outer Quandrant  Lab Review  No results found for any visits on 12/22/23.  Assessment:   1. Encounter for surveillance of injectable contraceptive      Plan:   Next appointment due between 03/08/24 and 03/22/24.    Mathis LITTIE Getting, CMA

## 2023-12-22 NOTE — Patient Instructions (Signed)
 Hypertension, Adult Hypertension is another name for high blood pressure. High blood pressure forces your heart to work harder to pump blood. This can cause problems over time. There are two numbers in a blood pressure reading. There is a top number (systolic) over a bottom number (diastolic). It is best to have a blood pressure that is below 120/80. What are the causes? The cause of this condition is not known. Some other conditions can lead to high blood pressure. What increases the risk? Some lifestyle factors can make you more likely to develop high blood pressure: Smoking. Not getting enough exercise or physical activity. Being overweight. Having too much fat, sugar, calories, or salt (sodium) in your diet. Drinking too much alcohol. Other risk factors include: Having any of these conditions: Heart disease. Diabetes. High cholesterol. Kidney disease. Obstructive sleep apnea. Having a family history of high blood pressure and high cholesterol. Age. The risk increases with age. Stress. What are the signs or symptoms? High blood pressure may not cause symptoms. Very high blood pressure (hypertensive crisis) may cause: Headache. Fast or uneven heartbeats (palpitations). Shortness of breath. Nosebleed. Vomiting or feeling like you may vomit (nauseous). Changes in how you see. Very bad chest pain. Feeling dizzy. Seizures. How is this treated? This condition is treated by making healthy lifestyle changes, such as: Eating healthy foods. Exercising more. Drinking less alcohol. Your doctor may prescribe medicine if lifestyle changes do not help enough and if: Your top number is above 130. Your bottom number is above 80. Your personal target blood pressure may vary. Follow these instructions at home: Eating and drinking  If told, follow the DASH eating plan. To follow this plan: Fill one half of your plate at each meal with fruits and vegetables. Fill one fourth of your plate  at each meal with whole grains. Whole grains include whole-wheat pasta, brown rice, and whole-grain bread. Eat or drink low-fat dairy products, such as skim milk or low-fat yogurt. Fill one fourth of your plate at each meal with low-fat (lean) proteins. Low-fat proteins include fish, chicken without skin, eggs, beans, and tofu. Avoid fatty meat, cured and processed meat, or chicken with skin. Avoid pre-made or processed food. Limit the amount of salt in your diet to less than 1,500 mg each day. Do not drink alcohol if: Your doctor tells you not to drink. You are pregnant, may be pregnant, or are planning to become pregnant. If you drink alcohol: Limit how much you have to: 0-1 drink a day for women. 0-2 drinks a day for men. Know how much alcohol is in your drink. In the [place] one drink equals one 12 oz bottle of beer (355 mL), one 5 oz glass of wine (148 mL), or one 1 oz glass of hard liquor (44 mL). Lifestyle  Work with your doctor to stay at a healthy weight or to lose weight. Ask your doctor what the best weight is for you. Get at least 30 minutes of exercise that causes your heart to beat faster (aerobic exercise) most days of the week. This may include walking, swimming, or biking. Get at least 30 minutes of exercise that strengthens your muscles (resistance exercise) at least 3 days a week. This may include lifting weights or doing Pilates. Do not smoke or use any products that contain nicotine or tobacco. If you need help quitting, ask your doctor. Check your blood pressure at home as told by your doctor. Keep all follow-up visits. Medicines Take over-the-counter and prescription medicines  only as told by your doctor. Follow directions carefully. Do not skip doses of blood pressure medicine. The medicine does not work as well if you skip doses. Skipping doses also puts you at risk for problems. Ask your doctor about side effects or reactions to medicines that you should watch  for. Contact a doctor if: You think you are having a reaction to the medicine you are taking. You have headaches that keep coming back. You feel dizzy. You have swelling in your ankles. You have trouble with your vision. Get help right away if: You get a very bad headache. You start to feel mixed up (confused). You feel weak or numb. You feel faint. You have very bad pain in your: Chest. Belly (abdomen). You vomit more than once. You have trouble breathing. These symptoms may be an emergency. Get help right away. Call 911. Do not wait to see if the symptoms will go away. Do not drive yourself to the hospital. Summary Hypertension is another name for high blood pressure. High blood pressure forces your heart to work harder to pump blood. For most people, a normal blood pressure is less than 120/80. Making healthy choices can help lower blood pressure. If your blood pressure does not get lower with healthy choices, you may need to take medicine. This information is not intended to replace advice given to you by your health care provider. Make sure you discuss any questions you have with your health care provider. Document Revised: 04/04/2021 Document Reviewed: 04/04/2021 Elsevier Patient Education  2024 [employer].

## 2024-01-18 ENCOUNTER — Ambulatory Visit: Admitting: Certified Nurse Midwife

## 2024-01-18 DIAGNOSIS — Z124 Encounter for screening for malignant neoplasm of cervix: Secondary | ICD-10-CM

## 2024-01-18 DIAGNOSIS — Z01419 Encounter for gynecological examination (general) (routine) without abnormal findings: Secondary | ICD-10-CM

## 2024-01-18 DIAGNOSIS — Z3042 Encounter for surveillance of injectable contraceptive: Secondary | ICD-10-CM

## 2024-02-03 ENCOUNTER — Encounter: Payer: Self-pay | Admitting: Internal Medicine

## 2024-02-03 ENCOUNTER — Ambulatory Visit: Payer: Self-pay | Admitting: Internal Medicine

## 2024-02-03 VITALS — BP 148/84 | HR 67 | Ht 70.0 in | Wt 132.4 lb

## 2024-02-03 DIAGNOSIS — F411 Generalized anxiety disorder: Secondary | ICD-10-CM | POA: Diagnosis not present

## 2024-02-03 DIAGNOSIS — G4700 Insomnia, unspecified: Secondary | ICD-10-CM | POA: Diagnosis not present

## 2024-02-03 DIAGNOSIS — I10 Essential (primary) hypertension: Secondary | ICD-10-CM | POA: Diagnosis not present

## 2024-02-03 MED ORDER — OLMESARTAN MEDOXOMIL 20 MG PO TABS: 20.0000 mg | ORAL_TABLET | Freq: Every day | ORAL | 1 refills | Status: AC

## 2024-02-03 MED ORDER — MIRTAZAPINE 7.5 MG PO TABS: 7.5000 mg | ORAL_TABLET | Freq: Every day | ORAL | 3 refills | Status: DC

## 2024-02-03 MED ORDER — BUSPIRONE HCL 7.5 MG PO TABS: 7.5000 mg | ORAL_TABLET | Freq: Two times a day (BID) | ORAL | 3 refills | Status: DC

## 2024-02-03 NOTE — Assessment & Plan Note (Addendum)
 BP Readings from Last 1 Encounters:  02/03/24 (!) 148/84   Uncontrolled Restart olmesartan  20 mg QD Check BMP before next visit Counseled for compliance with the medications Advised DASH diet and moderate exercise/walking, at least 150 mins/week

## 2024-02-03 NOTE — Patient Instructions (Addendum)
 Please start taking Mirtazepine at bedtime for insomnia.  Please take Buspirone  as needed for anxiety.  Please start taking Olmesartan  for blood pressure.  Please continue to follow low salt diet and perform moderate exercise/walking at least 150 mins/week.  Please get blood tests done before the next visit.  Please maintain simple sleep hygiene. - Maintain dark and non-noisy environment in the bedroom. - Please use the bedroom for sleep and sexual activity only. - Do not use electronic devices in the bedroom. - Please take dinner at least 2 hours before bedtime. - Please avoid caffeinated products in the evening, including coffee, soft drinks. - Please try to maintain the regular sleep-wake cycle - Go to bed and wake up at the same time.

## 2024-02-03 NOTE — Assessment & Plan Note (Addendum)
 Uncontrolled due to noncompliance DC Zoloft , as it was not effective She had tolerated BuSpar  better, restart BuSpar  at 7.5 mg BID PRN Restart Remeron  7.5 mg at bedtime, which can also improve sleep and appetite Simple relaxation techniques advised

## 2024-02-03 NOTE — Assessment & Plan Note (Signed)
 Likely worse in the setting of GAD Restart Remeron  and BuSpar  Sleep hygiene material provided

## 2024-02-03 NOTE — Progress Notes (Signed)
 Established Patient Office Visit  Subjective:  Patient ID: Joyce [name], female    DOB: July 12, 1995  Age: 28 y.o. MRN: [id number]  CC:  Chief Complaint  Patient presents with   Hypertension    Needs refill on bp medication.    Anxiety    Reports anxiety more than usual , having trouble sleeping.     HPI Joyce [name] is a 28 y.o. female with past medical history of HTN, MDD, GAD and opioid dependence who presents for f/u of her chronic medical conditions.  HTN: Her BP was elevated today.  She has run out of her olmesartan .  She reports generalized headache, but denies any dizziness, chest pain or palpitations.  MDD and GAD: She has lost follow-up with psychiatry.  She was placed on Zoloft  in the past, but did not have any improvement in her symptoms.  She had tolerated Remeron  better, which was also helping her with insomnia.  She currently reports spells of anxiety and insomnia as well.  Denies any SI or HI currently.  She has quit opioid use and has been off of Suboxone  as well.  Past Medical History:  Diagnosis Date   Allergy    seasonal   Anxiety    Elevated blood-pressure reading without diagnosis of hypertension 12/26/2014   Elevated serum creatinine 12/26/2014   Gestational hypertension 02/12/2015   H/O acute renal failure 12/26/2014   Heart murmur    was told that she had beginning stages   Migraine    Post-viral cough syndrome 06/20/2019   Renal dysfunction    Renal dysfunction 12/26/2014   S/P thyroid  surgery 12/26/2014   Severe preeclampsia    Short interval between pregnancies complicating pregnancy in third trimester, antepartum    Substance abuse (HCC)    uses marijuanna   Tension headache 11/11/2018   Thyroid  disease    right side removed   Underweight 12/26/2014    Past Surgical History:  Procedure Laterality Date   THYROID  SURGERY      Family History  Problem Relation Age of Onset   Hypertension Mother    Drug abuse Father    Alcohol abuse  Maternal Grandfather    Hypertension Maternal Grandmother    Birth defects Paternal Grandmother     Social History   Socioeconomic History   Marital status: Single    Spouse name: Not on file   Number of children: 2   Years of education: Not on file   Highest education level: High school graduate  Occupational History   Not on file  Tobacco Use   Smoking status: Former    Types: Cigarettes   Smokeless tobacco: Never  Vaping Use   Vaping status: Every Day  Substance and Sexual Activity   Alcohol use: Yes    Comment: occasion   Drug use: Not Currently    Frequency: 1.0 times per week    Types: Marijuana   Sexual activity: Not Currently    Birth control/protection: Injection  Other Topics Concern   Not on file  Social History Narrative   Lives with two kids   Auberi: 5 (full time)    Jaxon: 4 (half and half) [place]    Fathers are in the lives, help take care of children      Enjoys: playing with kids, going out to eat, movies and music      Diet: Does not feel like she eats as good as she should. Does eat veggies and fruits; will drink v8  Caffeine: pepis -4-5 cans a day, has slowed down    Water: gallon daily      Wear seat belt   Does not wear sunscreen   Smoke and carbon monoxide detectors   Does not use phone while driving, unless it is a call.    Social Drivers of Corporate investment banker Strain: Low Risk  (07/04/2020)   Overall Financial Resource Strain (CARDIA)    Difficulty of Paying Living Expenses: Not very hard  Food Insecurity: No Food Insecurity (07/04/2020)   Hunger Vital Sign    Worried About Running Out of Food in the Last Year: Never true    Ran Out of Food in the Last Year: Never true  Transportation Needs: No Transportation Needs (07/04/2020)   PRAPARE - Administrator, Civil Service (Medical): No    Lack of Transportation (Non-Medical): No  Physical Activity: Sufficiently Active (07/04/2020)   Exercise Vital Sign    Days of Exercise  per Week: 5 days    Minutes of Exercise per Session: 30 min  Stress: No Stress Concern Present (07/04/2020)   [employer] of Occupational Health - Occupational Stress Questionnaire    Feeling of Stress : Not at all  Social Connections: Socially Isolated (07/04/2020)   Social Connection and Isolation Panel    Frequency of Communication with Friends and Family: More than three times a week    Frequency of Social Gatherings with Friends and Family: Twice a week    Attends Religious Services: Never    Database administrator or Organizations: No    Attends Banker Meetings: Never    Marital Status: Never married  Intimate Partner Violence: Not At Risk (07/04/2020)   Humiliation, Afraid, Rape, and Kick questionnaire    Fear of Current or Ex-Partner: No    Emotionally Abused: No    Physically Abused: No    Sexually Abused: No    Outpatient Medications Prior to Visit  Medication Sig Dispense Refill   buprenorphine -naloxone  (SUBOXONE ) 2-0.5 mg SUBL SL tablet Place 1 tablet under the tongue daily. (Patient not taking: Reported on 02/03/2024) 30 tablet 0   doxycycline  (VIBRAMYCIN ) 100 MG capsule Take 1 capsule (100 mg total) by mouth 2 (two) times daily. (Patient not taking: Reported on 02/03/2024) 14 capsule 0   olmesartan  (BENICAR ) 20 MG tablet Take 1 tablet (20 mg total) by mouth daily. (Patient not taking: Reported on 02/03/2024) 90 tablet 0   predniSONE  (STERAPRED UNI-PAK 21 TAB) 10 MG (21) TBPK tablet Take by mouth daily. Take 6 tabs by mouth daily  for 1 days, then 5 tabs for 1 days, then 4 tabs for 1 days, then 3 tabs for 1 days, 2 tabs for 1 days, then 1 tab by mouth daily for 1 days (Patient not taking: Reported on 02/03/2024) 21 tablet 0   sertraline  (ZOLOFT ) 50 MG tablet 25 mg at night for one week, then 50 mg at night (Patient not taking: Reported on 02/03/2024) 30 tablet 1   Facility-Administered Medications Prior to Visit  Medication Dose Route Frequency Provider Last Rate Last  Admin   medroxyPROGESTERone  (DEPO-PROVERA ) injection 150 mg  150 mg Intramuscular Once Gledhill, Jane, CNM        No Known Allergies  ROS Review of Systems  Constitutional:  Negative for chills and fever.  HENT:  Negative for congestion and sore throat.   Eyes:  Negative for pain and discharge.  Respiratory:  Negative for cough and shortness of breath.  Cardiovascular:  Negative for chest pain and palpitations.  Gastrointestinal:  Negative for abdominal pain, diarrhea, nausea and vomiting.  Endocrine: Negative for polydipsia and polyuria.  Genitourinary:  Negative for dysuria and hematuria.  Musculoskeletal:  Negative for neck pain and neck stiffness.  Skin:  Negative for rash.  Neurological:  Negative for dizziness and weakness.  Psychiatric/Behavioral:  Positive for sleep disturbance. Negative for agitation and behavioral problems. The patient is nervous/anxious.       Objective:    Physical Exam Vitals reviewed.  Constitutional:      General: She is not in acute distress.    Appearance: She is not diaphoretic.  HENT:     Head: Normocephalic and atraumatic.     Nose: Nose normal. No congestion.     Mouth/Throat:     Mouth: Mucous membranes are moist.     Pharynx: No posterior oropharyngeal erythema.  Eyes:     General: No scleral icterus.    Extraocular Movements: Extraocular movements intact.  Cardiovascular:     Rate and Rhythm: Normal rate and regular rhythm.     Heart sounds: Normal heart sounds. No murmur heard. Pulmonary:     Breath sounds: Normal breath sounds. No wheezing or rales.  Musculoskeletal:     Cervical back: Neck supple. No tenderness.     Right lower leg: No edema.     Left lower leg: No edema.  Skin:    General: Skin is warm.     Findings: No rash.  Neurological:     General: No focal deficit present.     Mental Status: She is alert and oriented to person, place, and time.  Psychiatric:        Mood and Affect: Mood is anxious.         Behavior: Behavior normal.     BP (!) 148/84 (BP Location: Left Arm)   Pulse 67   Ht 5' 10 (1.778 m)   Wt 132 lb 6.4 oz (60.1 kg)   SpO2 100%   BMI 19.00 kg/m  Wt Readings from Last 3 Encounters:  02/03/24 132 lb 6.4 oz (60.1 kg)  12/22/23 129 lb 14.4 oz (58.9 kg)  10/02/23 134 lb (60.8 kg)    Lab Results  Component Value Date   TSH 0.456 11/01/2021   Lab Results  Component Value Date   WBC 9.8 05/11/2022   HGB 13.2 05/11/2022   HCT 40.5 05/11/2022   MCV 84.9 05/11/2022   PLT 207 05/11/2022   Lab Results  Component Value Date   NA 140 10/06/2022   K 3.6 10/06/2022   CO2 22 10/06/2022   GLUCOSE 88 10/06/2022   BUN 19 10/06/2022   CREATININE 1.40 (H) 10/06/2022   BILITOT 0.5 11/01/2021   ALKPHOS 122 (H) 11/01/2021   AST 17 11/01/2021   ALT 12 11/01/2021   PROT 6.4 11/01/2021   ALBUMIN 4.4 11/01/2021   CALCIUM  9.9 10/06/2022   ANIONGAP 6 05/11/2022   EGFR 53 (L) 10/06/2022   Lab Results  Component Value Date   CHOL 111 11/01/2021   Lab Results  Component Value Date   HDL 53 11/01/2021   Lab Results  Component Value Date   LDLCALC 47 11/01/2021   Lab Results  Component Value Date   TRIG 40 11/01/2021   Lab Results  Component Value Date   CHOLHDL 2.1 11/01/2021   Lab Results  Component Value Date   HGBA1C 5.3 11/01/2021      Assessment & Plan:  Problem List Items Addressed This Visit       Cardiovascular and Mediastinum   Essential hypertension - Primary   BP Readings from Last 1 Encounters:  02/03/24 (!) 148/84   Uncontrolled Restart olmesartan  20 mg QD Check BMP before next visit Counseled for compliance with the medications Advised DASH diet and moderate exercise/walking, at least 150 mins/week       Relevant Medications   olmesartan  (BENICAR ) 20 MG tablet   Other Relevant Orders   Basic Metabolic Panel (BMET)   CBC with Differential/Platelet   TSH     Other   Generalized anxiety disorder   Uncontrolled due to  noncompliance DC Zoloft , as it was not effective She had tolerated BuSpar  better, restart BuSpar  at 7.5 mg BID PRN Restart Remeron  7.5 mg at bedtime, which can also improve sleep and appetite Simple relaxation techniques advised      Relevant Medications   mirtazapine  (REMERON ) 7.5 MG tablet   busPIRone  (BUSPAR ) 7.5 MG tablet   Other Relevant Orders   TSH   Insomnia   Likely worse in the setting of GAD Restart Remeron  and BuSpar  Sleep hygiene material provided       Meds ordered this encounter  Medications   olmesartan  (BENICAR ) 20 MG tablet    Sig: Take 1 tablet (20 mg total) by mouth daily.    Dispense:  90 tablet    Refill:  1   mirtazapine  (REMERON ) 7.5 MG tablet    Sig: Take 1 tablet (7.5 mg total) by mouth at bedtime.    Dispense:  30 tablet    Refill:  3   busPIRone  (BUSPAR ) 7.5 MG tablet    Sig: Take 1 tablet (7.5 mg total) by mouth 2 (two) times daily.    Dispense:  60 tablet    Refill:  3    Follow-up: Return in about 3 months (around 05/05/2024) for HTN and GAD.    Suzzane MARLA Blanch, MD

## 2024-02-09 ENCOUNTER — Ambulatory Visit: Admitting: Certified Nurse Midwife

## 2024-02-29 ENCOUNTER — Other Ambulatory Visit: Payer: Self-pay | Admitting: Internal Medicine

## 2024-02-29 DIAGNOSIS — F411 Generalized anxiety disorder: Secondary | ICD-10-CM

## 2024-03-11 ENCOUNTER — Telehealth: Payer: Self-pay | Admitting: Obstetrics

## 2024-03-11 NOTE — Telephone Encounter (Signed)
 Contacted the patient to scheduled annual exam. The patient is passed due for Annual exam. She would need to be seen for annual / depo at her next visit. Unable to leave message.

## 2024-03-15 ENCOUNTER — Ambulatory Visit

## 2024-03-24 ENCOUNTER — Ambulatory Visit: Admitting: Certified Nurse Midwife
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
## 2024-05-02 ENCOUNTER — Ambulatory Visit: Admitting: Licensed Practical Nurse

## 2024-05-02 ENCOUNTER — Encounter: Payer: Self-pay | Admitting: Licensed Practical Nurse

## 2024-05-02 ENCOUNTER — Emergency Department
Admission: EM | Admit: 2024-05-02 | Discharge: 2024-05-02 | Disposition: A | Discharge status: Home / Self Care | Attending: Emergency Medicine | Admitting: Emergency Medicine

## 2024-05-02 ENCOUNTER — Other Ambulatory Visit (HOSPITAL_COMMUNITY)
Admission: RE | Admit: 2024-05-02 | Discharge: 2024-05-02 | Disposition: A | Discharge status: Home / Self Care | Source: Ambulatory Visit | Attending: Licensed Practical Nurse | Admitting: Licensed Practical Nurse

## 2024-05-02 ENCOUNTER — Ambulatory Visit (INDEPENDENT_AMBULATORY_CARE_PROVIDER_SITE_OTHER): Admitting: Licensed Practical Nurse

## 2024-05-02 VITALS — BP 166/110 | HR 87 | Ht 69.0 in | Wt 138.8 lb

## 2024-05-02 DIAGNOSIS — R030 Elevated blood-pressure reading, without diagnosis of hypertension: Secondary | ICD-10-CM

## 2024-05-02 DIAGNOSIS — Z01419 Encounter for gynecological examination (general) (routine) without abnormal findings: Secondary | ICD-10-CM | POA: Diagnosis not present

## 2024-05-02 DIAGNOSIS — Z3042 Encounter for surveillance of injectable contraceptive: Secondary | ICD-10-CM

## 2024-05-02 DIAGNOSIS — Z124 Encounter for screening for malignant neoplasm of cervix: Secondary | ICD-10-CM | POA: Insufficient documentation

## 2024-05-02 DIAGNOSIS — Z3202 Encounter for pregnancy test, result negative: Secondary | ICD-10-CM

## 2024-05-02 DIAGNOSIS — I129 Hypertensive chronic kidney disease with stage 1 through stage 4 chronic kidney disease, or unspecified chronic kidney disease: Secondary | ICD-10-CM | POA: Insufficient documentation

## 2024-05-02 DIAGNOSIS — N189 Chronic kidney disease, unspecified: Secondary | ICD-10-CM | POA: Insufficient documentation

## 2024-05-02 DIAGNOSIS — Z113 Encounter for screening for infections with a predominantly sexual mode of transmission: Secondary | ICD-10-CM | POA: Diagnosis not present

## 2024-05-02 DIAGNOSIS — I10 Essential (primary) hypertension: Secondary | ICD-10-CM | POA: Diagnosis not present

## 2024-05-02 LAB — POCT URINE PREGNANCY: Preg Test, Ur: NEGATIVE

## 2024-05-02 LAB — CBC
HCT: 38.4 % (ref 36.0–46.0)
Hemoglobin: 12.2 g/dL (ref 12.0–15.0)
MCH: 28 pg (ref 26.0–34.0)
MCHC: 31.8 g/dL (ref 30.0–36.0)
MCV: 88.1 fL (ref 80.0–100.0)
Platelets: 200 K/uL (ref 150–400)
RBC: 4.36 MIL/uL (ref 3.87–5.11)
RDW: 13.1 % (ref 11.5–15.5)
WBC: 5.7 K/uL (ref 4.0–10.5)
nRBC: 0 % (ref 0.0–0.2)

## 2024-05-02 LAB — COMPREHENSIVE METABOLIC PANEL WITH GFR
ALT: 12 U/L (ref 0–44)
AST: 20 U/L (ref 15–41)
Albumin: 4.1 g/dL (ref 3.5–5.0)
Alkaline Phosphatase: 89 U/L (ref 38–126)
Anion gap: 10 (ref 5–15)
BUN: 15 mg/dL (ref 6–20)
CO2: 23 mmol/L (ref 22–32)
Calcium: 8.9 mg/dL (ref 8.9–10.3)
Chloride: 103 mmol/L (ref 98–111)
Creatinine, Ser: 1.37 mg/dL — ABNORMAL HIGH (ref 0.44–1.00)
GFR, Estimated: 54 mL/min — ABNORMAL LOW (ref >60)
Glucose, Bld: 94 mg/dL (ref 70–99)
Potassium: 3.6 mmol/L (ref 3.5–5.1)
Sodium: 136 mmol/L (ref 135–145)
Total Bilirubin: 0.8 mg/dL (ref 0.0–1.2)
Total Protein: 7.2 g/dL (ref 6.5–8.1)

## 2024-05-02 MED ORDER — MEDROXYPROGESTERONE ACETATE 150 MG/ML IM SUSY
150.0000 mg | PREFILLED_SYRINGE | Freq: Once | INTRAMUSCULAR | Status: AC
  Administered 2024-05-02: 150 mg via INTRAMUSCULAR
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
## 2024-05-02 NOTE — ED Triage Notes (Signed)
 Pt presents to the ED via POV from OBGYN for htn. Pt reports hx of same and takes olmesartan . Pt reports that she has been taking it as prescribed and took her morning dose this AM. Pt reports being at [facility] for check up and states that they couldn't get her BP down. Pt reports some mild dizziness. A&Ox4. BP 165/110

## 2024-05-02 NOTE — ED Provider Notes (Signed)
 [facility] Provider Note    Event Date/Time   First MD Initiated Contact with Patient 05/02/24 1801     (approximate)   History   Chief Complaint Hypertension   HPI  Joyce [name] is a 28 y.o. female with past medical history of hypertension and CKD who presents to the ED complaining of elevated blood pressure.  Patient reports that she had a routine follow-up appointment with her OB/GYN earlier today, where they found her blood pressure to be elevated.  She states that she has been feeling well with some occasional lightheadedness, but denies any dizziness or sensation of the room spinning around her.  She has not had any headache, chest pain, shortness of breath, or difficulty urinating.  She states she has been taking all losartan as prescribed, has not missed any doses recently.  She had a pregnancy test performed at her OB/GYN that was negative.     Physical Exam   Triage Vital Signs: ED Triage Vitals  Encounter Vitals Group     BP 05/02/24 1629 (!) 165/110     Girls Systolic BP Percentile --      Girls Diastolic BP Percentile --      Boys Systolic BP Percentile --      Boys Diastolic BP Percentile --      Pulse Rate 05/02/24 1629 69     Resp 05/02/24 1629 18     Temp 05/02/24 1629 98.7 F (37.1 C)     Temp Source 05/02/24 1629 Oral     SpO2 05/02/24 1629 100 %     Weight 05/02/24 1630 138 lb (62.6 kg)     Height 05/02/24 1630 5' 9 (1.753 m)     Head Circumference --      Peak Flow --      Pain Score 05/02/24 1630 0     Pain Loc --      Pain Education --      Exclude from Growth Chart --     Most recent vital signs: Vitals:   05/02/24 1629  BP: (!) 165/110  Pulse: 69  Resp: 18  Temp: 98.7 F (37.1 C)  SpO2: 100%    Constitutional: Alert and oriented. Eyes: Conjunctivae are normal. Head: Atraumatic. Nose: No congestion/rhinnorhea. Mouth/Throat: Mucous membranes are moist.  Cardiovascular: Normal rate, regular rhythm.  Grossly normal heart sounds.  2+ radial pulses bilaterally. Respiratory: Normal respiratory effort.  No retractions. Lungs CTAB. Gastrointestinal: Soft and nontender. No distention. Musculoskeletal: No lower extremity tenderness nor edema.  Neurologic:  Normal speech and language. No gross focal neurologic deficits are appreciated.    ED Results / Procedures / Treatments   Labs (all labs ordered are listed, but only abnormal results are displayed) Labs Reviewed  COMPREHENSIVE METABOLIC PANEL WITH GFR - Abnormal; Notable for the following components:      Result Value   Creatinine, Ser 1.37 (*)    GFR, Estimated 54 (*)    All other components within normal limits  CBC     EKG  ED ECG REPORT I, Carlin Palin, the attending physician, personally viewed and interpreted this ECG.   Date: 05/02/2024  EKG Time: 18:28  Rate: 85  Rhythm: normal sinus rhythm  Axis: Normal  Intervals:none  ST&T Change: Nonspecific T wave abnormality  PROCEDURES:  Critical Care performed: No  Procedures   MEDICATIONS ORDERED IN ED: Medications - No data to display   IMPRESSION / MDM / ASSESSMENT AND PLAN / ED COURSE  I reviewed the triage vital signs and the nursing notes.                              28 y.o. female with past medical history of hypertension and CKD who presents to the ED complaining of elevated blood pressure found at routine OB/GYN appointment.  Patient's presentation is most consistent with acute presentation with potential threat to life or bodily function.  Differential diagnosis includes, but is not limited to, hypertensive emergency, stroke, ACS, AKI, anemia, electrolyte abnormality.  Patient nontoxic-appearing and in no acute distress, vital signs remarkable for hypertension but otherwise reassuring.  While she reports some lightheadedness, she denies any vertigo-like symptoms and has a nonfocal neurologic exam, no reason to suspect stroke at this time.  EKG shows  no evidence of arrhythmia or ischemia, patient denies any symptoms concerning for ACS or other hypertensive emergency.  Renal function stable compared to previous and labs without significant anemia, leukocytosis, or electrolyte abnormality.  LFTs are also unremarkable.  Given reassuring workup, patient appropriate for discharge home with PCP follow-up for recheck of BP, was counseled to return to the ED for new or worsening symptoms.  Patient agrees with plan.      FINAL CLINICAL IMPRESSION(S) / ED DIAGNOSES   Final diagnoses:  Uncontrolled hypertension     Rx / DC Orders   ED Discharge Orders     None        Note:  This document was prepared using Dragon voice recognition software and may include unintentional dictation errors.   Willo Dunnings, MD 05/02/24 [phone]

## 2024-05-03 LAB — HEPATITIS C ANTIBODY: Hep C Virus Ab: NONREACTIVE

## 2024-05-03 LAB — HEP, RPR, HIV PANEL
HIV Screen 4th Generation wRfx: NONREACTIVE
Hepatitis B Surface Ag: NEGATIVE
RPR Ser Ql: NONREACTIVE

## 2024-05-04 ENCOUNTER — Ambulatory Visit: Payer: Self-pay

## 2024-05-04 LAB — CYTOLOGY - PAP
Chlamydia: NEGATIVE
Comment: NEGATIVE
Comment: NEGATIVE
Comment: NORMAL
Diagnosis: NEGATIVE
High risk HPV: NEGATIVE
Neisseria Gonorrhea: NEGATIVE

## 2024-05-04 NOTE — Telephone Encounter (Signed)
 Noted patient advised ED

## 2024-05-04 NOTE — Telephone Encounter (Signed)
 FYI Only or Action Required?: FYI only for provider: ED advised.  Patient was last seen in primary care on 02/03/2024 by Joyce Suzzane POUR, MD.  Called Nurse Triage reporting Hypertension.  Symptoms began yesterday.  Interventions attempted: Rest, hydration, or home remedies.  Symptoms are: gradually worsening.  Triage Disposition: Call EMS 911 Now  Patient/caregiver understands and will follow disposition?: Yes     Copied from CRM #[id number]. Topic: Clinical - Red Word Triage >> May 04, 2024  9:18 AM Anairis L wrote: [facility] that prompted transfer to Nurse Triage: Bp 173/114  Headache, blurry vision. Tightness in chest. Reason for Disposition  [1] Chest pain lasts > 5 minutes AND [2] described as crushing, pressure-like, or heavy  Answer Assessment - Initial Assessment Questions 1. LOCATION: Where does it hurt?       Pressure in chest 2. RADIATION: Does the pain go anywhere else? (e.g., into neck, jaw, arms, back)     Left 3. ONSET: When did the chest pain begin? (Minutes, hours or days)      Last few days 4. PATTERN: Does the pain come and go, or has it been constant since it started?  Does it get worse with exertion?      Intermittent  7. CARDIAC RISK FACTORS: Do you have any history of heart problems 9. CAUSE: What do you think is causing the chest pain?     Unknown, believed to be BP 10. OTHER SYMPTOMS: Do you have any other symptoms? (e.g., dizziness, nausea, vomiting, sweating, fever, difficulty breathing, cough)       HA ongoing since yesterday, blurry vision, cloudy vision, BP 173/114  Protocols used: Chest Pain-A-AH

## 2024-05-12 ENCOUNTER — Ambulatory Visit: Admitting: Family Medicine

## 2024-07-05 ENCOUNTER — Ambulatory Visit
Admission: EM | Admit: 2024-07-05 | Discharge: 2024-07-05 | Disposition: A | Discharge status: Home / Self Care | Attending: Emergency Medicine | Admitting: Emergency Medicine

## 2024-07-05 DIAGNOSIS — J0100 Acute maxillary sinusitis, unspecified: Secondary | ICD-10-CM

## 2024-07-05 DIAGNOSIS — I10 Essential (primary) hypertension: Secondary | ICD-10-CM

## 2024-07-05 MED ORDER — AMOXICILLIN-POT CLAVULANATE 875-125 MG PO TABS: 1.0000 | ORAL_TABLET | Freq: Two times a day (BID) | ORAL | 0 refills | Status: AC

## 2024-07-05 NOTE — ED Triage Notes (Signed)
 Patient to Urgent Care with complaints of cough (productive)/ chest congestion and tightness/ sore throat. Denies any known fevers.   Symptoms x1 week. Exposed to flu.   Meds: using nyquil/ coricidin/ hot tea.
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
## 2024-07-05 NOTE — Discharge Instructions (Addendum)
 Take the Augmentin  as directed for your sinus infection.  Follow-up with your primary care provider.  Your blood pressure is elevated today at 154/103; repeat 151/101.  Please have this rechecked by your primary care provider.

## 2024-07-19 ENCOUNTER — Ambulatory Visit (INDEPENDENT_AMBULATORY_CARE_PROVIDER_SITE_OTHER): Payer: Self-pay

## 2024-07-19 VITALS — BP 128/99 | HR 93 | Ht 69.0 in | Wt 144.8 lb

## 2024-07-19 DIAGNOSIS — Z3202 Encounter for pregnancy test, result negative: Secondary | ICD-10-CM

## 2024-07-19 DIAGNOSIS — Z3042 Encounter for surveillance of injectable contraceptive: Secondary | ICD-10-CM

## 2024-07-19 LAB — POCT URINE PREGNANCY: Preg Test, Ur: NEGATIVE

## 2024-07-19 MED ORDER — MEDROXYPROGESTERONE ACETATE 150 MG/ML IM SUSP
150.0000 mg | Freq: Once | INTRAMUSCULAR | Status: AC
  Administered 2024-07-19: 150 mg via INTRAMUSCULAR

## 2024-07-19 NOTE — Progress Notes (Signed)
" ° ° °  NURSE VISIT NOTE  Subjective:    Patient ID: Joyce [name], female    DOB: 1996-02-04, 29 y.o.   MRN: [id number]  HPI  Patient is a 29 y.o. [id number] female who presents for depo provera  injection.   Objective:    There were no vitals taken for this visit.  Last Annual: 05/24/24. Last pap: 05/02/24. Last Depo-Provera : 05/02/24. Side Effects if any: none. Serum HCG indicated? No . Depo-Provera  150 mg IM given by: Mathis Getting, CMA. Site: Right Upper Outer Quandrant  Lab Review  No results found for any visits on 07/19/24.  Assessment:   1. Encounter for management and injection of depo-Provera       Plan:   Next appointment due between 10/04/24 and 10/18/24.    Mathis LITTIE Getting, CMA  "

## 2024-07-19 NOTE — Patient Instructions (Signed)

## 2024-10-12 ENCOUNTER — Ambulatory Visit
